# Patient Record
Sex: Male | Born: 1955
Health system: Southern US, Community
[De-identification: ages and names within clinical notes are randomized; demographics above are authoritative.]

## PROBLEM LIST (undated history)

## (undated) DIAGNOSIS — R51 Headache: Secondary | ICD-10-CM

## (undated) DIAGNOSIS — I10 Essential (primary) hypertension: Secondary | ICD-10-CM

## (undated) DIAGNOSIS — E119 Type 2 diabetes mellitus without complications: Secondary | ICD-10-CM

## (undated) DIAGNOSIS — K219 Gastro-esophageal reflux disease without esophagitis: Secondary | ICD-10-CM

## (undated) DIAGNOSIS — R519 Headache, unspecified: Secondary | ICD-10-CM

## (undated) HISTORY — DX: Type 2 diabetes mellitus without complications: E11.9

## (undated) HISTORY — PX: NO PAST SURGERIES: SHX2092

---

## 2007-01-10 ENCOUNTER — Ambulatory Visit: Payer: Self-pay | Admitting: Internal Medicine

## 2007-01-15 ENCOUNTER — Ambulatory Visit: Payer: Self-pay | Admitting: Internal Medicine

## 2007-01-15 ENCOUNTER — Ambulatory Visit (HOSPITAL_COMMUNITY): Admission: RE | Admit: 2007-01-15 | Discharge: 2007-01-15 | Payer: Self-pay | Admitting: Internal Medicine

## 2007-01-15 ENCOUNTER — Encounter (INDEPENDENT_AMBULATORY_CARE_PROVIDER_SITE_OTHER): Payer: Self-pay | Admitting: Specialist

## 2009-07-28 ENCOUNTER — Ambulatory Visit (HOSPITAL_COMMUNITY): Admission: RE | Admit: 2009-07-28 | Discharge: 2009-07-28 | Payer: Self-pay | Admitting: Pulmonary Disease

## 2011-02-18 NOTE — Op Note (Signed)
NAME:  Robert [name], Robert [name]               ACCOUNT NO.:  [id number]   MEDICAL RECORD NO.:  [id number]          PATIENT TYPE:  AMB   LOCATION:  DAY                           FACILITY:  [facility]   PHYSICIAN:  R. Garfield Cornea, M.D. DATE OF BIRTH:  [birth year]   DATE OF PROCEDURE:  01/15/2007  DATE OF DISCHARGE:                               OPERATIVE REPORT   PROCEDURE:  Colonoscopy and biopsy and hot snare polypectomy.   INDICATIONS FOR PROCEDURE:  The patient is a 55 year old gentleman with  progressive constipation and hematochezia recently.  No family history  of colorectal neoplasia.  He has never had his lower GI tract imaged.  He has derived significant improvement in his constipation symptoms just  in the past week prior to being seen by me in January 10, 2007, with  Amitiza 24 mcg b.i.d. Colonoscopy is now being done.  This approach has  been discussed with the patient at length.  Potential risks, benefits  and alternatives have been reviewed.  Questions answered, he is  agreeable.  Please see documentation in the medical record.   PROCEDURE NOTE:  O2 saturation, blood pressure, pulse and respirations  monitored throughout the entire procedure.   CONSCIOUS SEDATION:  Demerol 100 mg IV and Versed 5 mg IV in divided  doses.   INSTRUMENT:  Pentax video chip system.   FINDINGS:  Digital rectal exam revealed no abnormalities.   ENDOSCOPIC FINDINGS:  The prep was adequate.  Examination of the colonic  mucosa was undertaken from the rectosigmoid junction, through the left  transverse and right colon to the area of appendiceal orifice, ileocecal  valve and cecum.  These structures were well seen and photographed for  the record.  From this level, the scope was slowly withdrawn.  All  previously mentioned mucosal surfaces were again seen.  The patient had  a hyperplastic appearing 4 mm polyp at the splenic flexure which was  cold biopsied/removed.  In the distal descending colon, there was a 1  cm  angry pedunculated polyp with central ulceration.  Please see photos.  This was engaged with snare removed via hot snare cautery and recovered.  The remainder of the colonic mucosa appeared normal.  Scope was pulled  down in the rectum where thorough examination of the rectal mucosa  including retroflexed view of the anal verge demonstrated some friable  anal canal tissue, but there was no hemorrhoid or other lesion.  The  patient tolerated the procedure well and was reactive.   ENDOSCOPY IMPRESSION:  Friable anal canal, otherwise normal rectum.  Pedunculated polyp distal descending colon, hot snared.  Diminutive  polyp splenic flexure, cold biopsied.  Remainder of colonic mucosa  appeared normal.   RECOMMENDATIONS:  1. No aspirin or arthritis medications for 10 days.  2. Hemorrhoid, diverticulosis and polyp literature provided to Mr.      Szymborski.  3. A 10-day course of Anusol HC suppositories one per rectum at      bedtime.  4. Daily fiber supplement.  5. Resume Amitiza 24 mcg orally b.i.d.  Follow-up on path.  6. Further recommendations to  follow.      Bridgette Habermann, M.D.  Electronically Signed     RMR/MEDQ  D:  01/15/2007  T:  01/15/2007  Job:  [id number]   cc:   Percell Miller L. Luan Pulling, M.D.  Fax: [phone]

## 2011-02-18 NOTE — H&P (Signed)
NAME:  Robert Robert [name], Robert Robert [name]               ACCOUNT NO.:  [id number]   MEDICAL RECORD NO.:  [id number]          PATIENT TYPE:  AMB   LOCATION:                                FACILITY:  [facility]   PHYSICIAN:  Robert Robert [name], M.D. DATE OF BIRTH:  1956/08/20   DATE OF ADMISSION:  01/10/2007  DATE OF DISCHARGE:  LH                              HISTORY & PHYSICAL   REASON FOR CONSULTATION:  Rectal bleeding, constipation.   REFERRING PHYSICIAN:  Edward L. Luan [name], M.D.   HISTORY OF PRESENT ILLNESS:  Robert Robert [name] is a very pleasant  morbidly obese 55 year old African-American male sent over via the  courtesy of Dr. Velvet [name] to further evaluate insidiously progressive  difficulty in having a bowel movement.  He feels that he has been  chronically constipated for the past couple of years.  It is worsened  over the past couple of months.  He has had intermittent rectal bleeding  with trying to have a bowel movement, particularly over the past couple  of months.  His wife, who accompanies him today, also relates that he  may have 3 or 4 bowel movements in the morning but does not ever feel  like he has a good evacuation.  He has not really had any associated  abdominal pain.  He does have some anorectal itching.  He has not lost  any weight.  There is no family history of colorectal neoplasia.  He has  never had his lower GI tract imaged.   Just Robert past week, Robert Robert [name] was started on Amitiza 24 mcg b.i.d. by  Dr. Luan [name].  Robert has been associated with significant improvement in  bowel function, although he continues to see blood per rectum.   PAST MEDICAL HISTORY:  Hypercholesterolemia.  Although Robert Robert [name]  does not recall, I saw him back in 1999 for some fleeting right-sided  abdominal pain.  He had a fatty-appearing liver on ultrasound.  HIDA  with fatty meal challenge was normal.  LFTs were also normal back at  that time.  He does not remember having any of Robert abdominal pain and  did not come back for followup, as instructed.   PAST SURGICAL HISTORY:  None.   CURRENT MEDICATIONS:  1. Crestor 10 mg daily.  2. Amitiza 24 mcg 2 b.i.d.   FAMILY HISTORY:  Father died with congestive heart failure at age 76.  Mother is alive and in good health.  No history of chronic GI or liver  illness.   SOCIAL HISTORY:  Patient is married.  He has three children who are  adopted.  One of his adopted children is on the liver transplant list at  [facility].  She is in the process of undergoing esophageal band  ligation.  The etiology of cirrhosis not known to mom or dad.  He does  not smoke or use alcohol.  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   REVIEW OF SYSTEMS:  No recent chest pain.  No dyspnea on exertion.  No  fevers or chills.  No change in weight.   PHYSICAL EXAMINATION:  GENERAL:  A pleasant, stocky, obese 55 year old  Robert [name] accompanied by his wife.  Weight 350.  Height 6 feet 10.  Temp  98.1, BP 118/78, pulse 72.  SKIN:  Warm and dry.  CHEST:  Lungs are clear to auscultation.  CARDIAC:  Regular rate and rhythm without murmur, rub or gallop.  ABDOMEN:  Massively obese.  Positive bowel sounds.  Soft, nontender  without obvious mass or organomegaly.  EXTREMITIES:  No edema.  RECTAL:  No external lesions.  Digital exam does not reveal any obvious  mass.  Prostate is poorly palpated.  Scant brown stool in the rectal  vault is hemoccult positive.   IMPRESSION:  Robert Robert [name] is a very pleasant 55 year old morbidly  obese Robert [name] with chronic constipation, essentially worsened recently  but transiently improved Robert past week with Amitiza, who has had  hematochezia.  It is somewhat bothersome to me that he has perceived  difficulty in evacuating with hematochezia.   RECOMMENDATIONS:  Robert Robert [name] needs a colonoscopy as soon as can be  arranged.  Potential risks, benefits and alternatives have been   reviewed, questions answered.  All parties are agreeable.  We will make  further recommendations once the diagnostic colonoscopy has been  performed, hopefully next week.   I would like to thank Dr. Velvet [name] for allowing me to see Robert nice  Robert [name] today.      Robert Robert [name], M.D.  Electronically Signed     RMR/MEDQ  D:  01/10/2007  T:  01/10/2007  Job:  [id number]   cc:   Percell Miller L. Luan [name], M.D.  Fax: [phone]

## 2015-09-24 ENCOUNTER — Encounter: Payer: Self-pay | Admitting: Nutrition

## 2015-09-24 ENCOUNTER — Encounter: Payer: BC Managed Care – PPO | Attending: Pulmonary Disease | Admitting: Nutrition

## 2015-09-24 VITALS — Ht 74.0 in | Wt 381.0 lb

## 2015-09-24 DIAGNOSIS — E1165 Type 2 diabetes mellitus with hyperglycemia: Secondary | ICD-10-CM | POA: Diagnosis present

## 2015-09-24 DIAGNOSIS — Z6841 Body Mass Index (BMI) 40.0 and over, adult: Secondary | ICD-10-CM | POA: Diagnosis not present

## 2015-09-24 DIAGNOSIS — IMO0002 Reserved for concepts with insufficient information to code with codable children: Secondary | ICD-10-CM

## 2015-09-24 DIAGNOSIS — E785 Hyperlipidemia, unspecified: Secondary | ICD-10-CM | POA: Diagnosis not present

## 2015-09-24 DIAGNOSIS — Z713 Dietary counseling and surveillance: Secondary | ICD-10-CM | POA: Insufficient documentation

## 2015-09-24 DIAGNOSIS — E118 Type 2 diabetes mellitus with unspecified complications: Secondary | ICD-10-CM

## 2015-09-25 NOTE — Progress Notes (Signed)
  Medical Nutrition Therapy:  Appt start time: 1600 end time:  1530  Assessment:  Primary concerns today: Diabetes, Hyperlipidemia and Obesity. Lives with his wife. Most recent A1C  7,1%. He is here with his wife. His wife does the cooking and the shopping. He admits to liking to eat.TChol 271, TG 204, HDL 34, LDL 196. He is on Simvastatin and Metformin 1000 mg BID. Not dong any physical activity right now. Most foods are baked, broiled and fried. Eats out quite a bit. Hasn't met with a dietitian before. Willing to make lifestyle changes to improve his health and reduce risk of complications of DM, Obesity and Hyperlipidemia.  Diet is excessive in calories, fat, sodium and carbs and low in fresh fruits and vegetables.   Preferred Learning Style:   Auditory  Visual  Hands on    Learning Readiness:    Ready  Change in progress   MEDICATIONS: See lis   DIETARY INTAKE:  24-hr recall:  B ( AM): [place] biscuits, Diet soda  Snk ( AM): chips or nabs sometimes  L ( PM): fast food sometimes or sandwich Snk ( PM):  D ( PM): meat, potatoes, greens, bread, water Snk ( PM): misc Beverages: water, diet soda or crystal light  Usual physical activity: ADL  Estimated energy needs:  2000 calories  225 g carbohydrates 150 g protein 56 g fat  Progress Towards Goal(s):  In progress.   Nutritional Diagnosis:  NB-1.1 Food and nutrition-related knowledge deficit As related to DM.  As evidenced by A1C 7.1%..    Intervention: Nutrition and Diabetes education provided on My Plate, CHO counting, meal planning, portion sizes, timing of meals, avoiding snacks between meals unless having a low blood sugar, target ranges for A1C and blood sugars, signs/symptoms and treatment of hyper/hypoglycemia, monitoring blood sugars, taking medications as prescribed, benefits of exercising 30 minutes per day and prevention of complications of DM. Low Fat low sodium high fiber diet.   Goals; 1. Follow My  Plate Method 2. Cut out snacks between meals. 3. Eat 3-4 carb choices per meal. 4. Drink only water 5. Exercise 30 minutes per week. 6. Lose 1-2 lbs per week. 7. Get A1C down to 6.5% in three months.  Teaching Method Utilized:   Visual Auditory Hands on  Handouts given during visit include:  Meal Plan Card  Diabetes Instructions  Plate Method  Barriers to learning/adherence to lifestyle change:  None  Demonstrated degree of understanding via:  Teach Back   Monitoring/Evaluation:  Dietary intake, exercise, meal planning, SBG, and body weight in 1 month(s).

## 2015-09-25 NOTE — Patient Instructions (Signed)
Goals; 1. Follow My Plate Method 2. Cut out snacks between meals. 3. Eat 3-4 carb choices per meal. 4. Drink only water 5. Exercise 30 minutes per week. 6. Lose 1-2 lbs per week. 7. Get A1C down to 6.5% in three months.

## 2015-10-30 ENCOUNTER — Encounter: Payer: BC Managed Care – PPO | Attending: Pulmonary Disease | Admitting: Nutrition

## 2015-10-30 VITALS — Ht 74.0 in | Wt 377.0 lb

## 2015-10-30 DIAGNOSIS — E1165 Type 2 diabetes mellitus with hyperglycemia: Secondary | ICD-10-CM | POA: Insufficient documentation

## 2015-10-30 DIAGNOSIS — Z6841 Body Mass Index (BMI) 40.0 and over, adult: Secondary | ICD-10-CM | POA: Insufficient documentation

## 2015-10-30 DIAGNOSIS — Z713 Dietary counseling and surveillance: Secondary | ICD-10-CM | POA: Diagnosis not present

## 2015-10-30 DIAGNOSIS — E785 Hyperlipidemia, unspecified: Secondary | ICD-10-CM | POA: Diagnosis not present

## 2015-10-30 DIAGNOSIS — IMO0002 Reserved for concepts with insufficient information to code with codable children: Secondary | ICD-10-CM

## 2015-10-30 DIAGNOSIS — E118 Type 2 diabetes mellitus with unspecified complications: Secondary | ICD-10-CM

## 2015-10-30 NOTE — Progress Notes (Signed)
  Medical Nutrition Therapy:  Appt start time: 1600 end time:  1530  Assessment:  Primary concerns today: Diabetes, Hyperlipidemia and Obesity. He notes he is trying to stick with the diet. Avoiding snacks. 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 Feels better. Lost 4 lbs. Drinking more water. Making better choices slowly. Still needs more low carb vegetables and less fat and salty foods; eating bologna and biscuits. Still drinking diet soda some but more water than he use to. Still on Metformin 500 mg/d.  Preferred Learning Style:   Auditory  Visual  Hands on   Learning Readiness:    Ready  Change in progress   MEDICATIONS: See lis   DIETARY INTAKE:  24-hr recall:  B ( AM): Coffee, Cherrios  1 1/2 c with 2% milk Snk ( AM): apple L ( PM): bologna sandwich on honey wheat, with slaw,  Vegetables soup 1 cup and 6 crackers. Snk ( PM): none D ( PM): chicken baked, biscuit, and creamed potatoes, water Snk ( PM): misc  Beverages: water, diet soda or crystal light  Usual physical activity: ADL  Estimated energy needs:  2000 calories  225 g carbohydrates 150 g protein 56 g fat  Progress Towards Goal(s):  In progress.   Nutritional Diagnosis:  NB-1.1 Food and nutrition-related knowledge deficit As related to DM.  As evidenced by A1C 7.1%..    Intervention: Nutrition and Diabetes education provided on My Plate, CHO counting, meal planning, portion sizes, timing of meals, avoiding snacks between meals unless having a low blood sugar, target ranges for A1C and blood sugars, signs/symptoms and treatment of hyper/hypoglycemia, monitoring blood sugars, taking medications as prescribed, benefits of exercising 30 minutes per day and prevention of complications of DM. Low Fat low sodium high fiber diet.   Goals; 1. Follow My Plate Method 2. Cut out snacks between meals. 3. Eat 3-4  carb choices per meal. 4. Drink only water 5. Exercise 30 minutes per week. 6. Lose 1-2 lbs per week. 7. Get A1C down to 6.5% in three months. 8. Cut out processed meats of bologna, bacon, and biscuits.   Teaching Method Utilized:   Visual Auditory Hands on  Handouts given during visit include:  Meal Plan Card  Diabetes Instructions  Plate Method  Barriers to learning/adherence to lifestyle change:  None  Demonstrated degree of understanding via:  Teach Back   Monitoring/Evaluation:  Dietary intake, exercise, meal planning, SBG, and body weight in 1-3 month(s).

## 2015-11-02 ENCOUNTER — Encounter: Payer: Self-pay | Admitting: Pulmonary Disease

## 2015-11-02 NOTE — Patient Instructions (Signed)
Goals; 1. Follow My Plate Method 2. Cut out snacks between meals. 3. Eat 3-4 carb choices per meal. 4. Drink only water 5. Exercise 30 minutes per week. 6. Lose 1-2 lbs per week. 7. Get A1C down to 6.5% in three months. 8. Cut out processed meats of bologna, bacon, and biscuits.

## 2015-12-17 ENCOUNTER — Encounter: Payer: BC Managed Care – PPO | Attending: Pulmonary Disease | Admitting: Nutrition

## 2015-12-17 VITALS — Ht 74.0 in | Wt 375.0 lb

## 2015-12-17 DIAGNOSIS — E1165 Type 2 diabetes mellitus with hyperglycemia: Secondary | ICD-10-CM | POA: Diagnosis present

## 2015-12-17 DIAGNOSIS — Z713 Dietary counseling and surveillance: Secondary | ICD-10-CM | POA: Diagnosis not present

## 2015-12-17 DIAGNOSIS — Z6841 Body Mass Index (BMI) 40.0 and over, adult: Secondary | ICD-10-CM | POA: Diagnosis not present

## 2015-12-17 DIAGNOSIS — E785 Hyperlipidemia, unspecified: Secondary | ICD-10-CM | POA: Insufficient documentation

## 2015-12-17 DIAGNOSIS — E118 Type 2 diabetes mellitus with unspecified complications: Secondary | ICD-10-CM

## 2015-12-17 DIAGNOSIS — IMO0002 Reserved for concepts with insufficient information to code with codable children: Secondary | ICD-10-CM

## 2015-12-17 NOTE — Progress Notes (Signed)
  Medical Nutrition Therapy:  Appt start time: 1600 end time:  1530  Assessment:  Primary concerns today: Diabetes, Lost his daughter recently and has been depressed.  Forgot his meter. Lost 6 lbs. FBS: 150's Bedtime: 160's. Changed: Cut out sodas and eating whole wheat bread and eating more vegetables. Cut out french fries. Eating before 6 pm at night. Starting to do some walking for exercise. Diet still higher in fat and sodium, but he is getting his meal times down better and cutting ut snacks.. BS are getting better. Still on Metformin 500 mg BID.  Preferred Learning Style:   Auditory  Visual  Hands on   Learning Readiness:    Ready  Change in progress   MEDICATIONS: See lis   DIETARY INTAKE:  24-hr recall:  B ( AM): Oatmeal without sugar, Snk ( AM):  L ( PM): 1 hot dog with bun, water  Snk ( PM): none D ( PM): chicken baked, biscuit, and creamed potatoes, water Snk ( PM): misc  Beverages: water, diet soda or crystal light  Usual physical activity: ADL  Estimated energy needs:  2000 calories  225 g carbohydrates 150 g protein 56 g fat  Progress Towards Goal(s):  In progress.   Nutritional Diagnosis:  NB-1.1 Food and nutrition-related knowledge deficit As related to DM.  As evidenced by A1C 7.1%..    Intervention: Nutrition and Diabetes education provided on My Plate, CHO counting, meal planning, portion sizes, timing of meals, avoiding snacks between meals unless having a low blood sugar, target ranges for A1C and blood sugars, signs/symptoms and treatment of hyper/hypoglycemia, monitoring blood sugars, taking medications as prescribed, benefits of exercising 30 minutes per day and prevention of complications of DM. Low Fat low sodium high fiber diet.   Goals; 1. Follow My Plate Method 2. Cut out snacks between meals. 3. Eat 3-4 carb choices per meal. 4. Drink only water 5. Exercise 30 minutes per week. 6. Lose 1-2 lbs per week. 7. Get A1C down to 6.5% in  three months. 8. Cut out processed meats of bologna, bacon, and biscuits.   Teaching Method Utilized:   Visual Auditory Hands on  Handouts given during visit include:  Meal Plan Card  Diabetes Instructions  Plate Method  Barriers to learning/adherence to lifestyle change:  None  Demonstrated degree of understanding via:  Teach Back   Monitoring/Evaluation:  Dietary intake, exercise, meal planning, SBG, and body weight in 1-3 month(s).

## 2015-12-28 NOTE — Patient Instructions (Signed)
Goals; 1. Follow My Plate Method 2. Cut out snacks between meals. 3. Eat 3-4 carb choices per meal. 4. Drink only water 5. Exercise 30 minutes per week. 6. Lose 1-2 lbs per week. 7. Get A1C down to 6.5% in three months.

## 2016-02-22 ENCOUNTER — Other Ambulatory Visit (HOSPITAL_COMMUNITY): Payer: Self-pay | Admitting: Pulmonary Disease

## 2016-02-22 DIAGNOSIS — R519 Headache, unspecified: Secondary | ICD-10-CM

## 2016-02-22 DIAGNOSIS — R51 Headache: Principal | ICD-10-CM

## 2016-03-18 ENCOUNTER — Encounter: Payer: BC Managed Care – PPO | Attending: Pulmonary Disease | Admitting: Nutrition

## 2016-03-18 ENCOUNTER — Encounter: Payer: Self-pay | Admitting: Nutrition

## 2016-03-18 VITALS — Ht 73.0 in | Wt 370.0 lb

## 2016-03-18 DIAGNOSIS — E119 Type 2 diabetes mellitus without complications: Secondary | ICD-10-CM | POA: Insufficient documentation

## 2016-03-18 DIAGNOSIS — IMO0002 Reserved for concepts with insufficient information to code with codable children: Secondary | ICD-10-CM

## 2016-03-18 DIAGNOSIS — E118 Type 2 diabetes mellitus with unspecified complications: Secondary | ICD-10-CM

## 2016-03-18 DIAGNOSIS — E1165 Type 2 diabetes mellitus with hyperglycemia: Secondary | ICD-10-CM

## 2016-03-18 NOTE — Patient Instructions (Signed)
Goals. Keep up the great job!!! 1. Increase fresh fruits and vegetables and protein 2. Increase physical actvitity. 3. Lose 1-2 lbs per week. 4. Get A1C less than 6.5%.

## 2016-03-18 NOTE — Progress Notes (Signed)
  Medical Nutrition Therapy:  Appt start time: 1630 end time:  1645  Assessment:  Primary concerns today: Diabetes.   Lost 5 lbs for a total of 11 lbs so far.. Has cut out eating white bread, eating more vegetables and more baked foods. Watching portion sizes of milk. Physical aciviity; walking more and  BS are down in the150-180 mg/dl.. Cut out coffee and soft drinks.  Sleeping better. Won a weight loss contest at work.  Making progress. Still on 500 mg of Metformin BID. No current A1C.  Preferred Learning Style:   Auditory  Visual  Hands on   Learning Readiness:    Ready  Change in progress   MEDICATIONS: See lis   DIETARY INTAKE:  24-hr recall:  B ( AM): Oatmeal without sugar, Snk ( AM):  L ( PM): 1 hot dog with bun, water  Snk ( PM): none D ( PM): chicken baked, biscuit, and creamed potatoes, water Snk ( PM): misc  Beverages: water, diet soda or crystal light  Usual physical activity: ADL  Estimated energy needs:  2000 calories  225 g carbohydrates 150 g protein 56 g fat  Progress Towards Goal(s):  In progress.   Nutritional Diagnosis:  NB-1.1 Food and nutrition-related knowledge deficit As related to DM.  As evidenced by A1C 7.1%..    Intervention: Nutrition and Diabetes education provided on My Plate, CHO counting, meal planning, portion sizes, timing of meals, avoiding snacks between meals unless having a low blood sugar, target ranges for A1C and blood sugars, signs/symptoms and treatment of hyper/hypoglycemia, monitoring blood sugars, taking medications as prescribed, benefits of exercising 30 minutes per day and prevention of complications of DM. Low Fat low sodium high fiber diet.   Goals;  Goals. Keep up the great job!!! 1. Increase fresh fruits and vegetables and protein 2. Increase physical actvitity. 3. Lose 1-2 lbs per week. 4. Get A1C less than 6.5%.  Teaching Method Utilized:   Visual Auditory Hands on  Handouts given during visit  include:  Meal Plan Card  Diabetes Instructions  Plate Method  Barriers to learning/adherence to lifestyle change:  None  Demonstrated degree of understanding via:  Teach Back   Monitoring/Evaluation:  Dietary intake, exercise, meal planning, SBG, and body weight in 3 month(s).   He would benefit from going up to 1000 mg of Metformin BID for improved blood sugars.

## 2016-06-16 ENCOUNTER — Ambulatory Visit: Payer: BC Managed Care – PPO | Admitting: Nutrition

## 2016-06-28 ENCOUNTER — Ambulatory Visit: Payer: BC Managed Care – PPO | Admitting: Nutrition

## 2016-07-25 ENCOUNTER — Ambulatory Visit: Payer: BC Managed Care – PPO | Admitting: Nutrition

## 2016-08-16 ENCOUNTER — Other Ambulatory Visit (HOSPITAL_COMMUNITY): Payer: Self-pay | Admitting: Pulmonary Disease

## 2016-08-16 DIAGNOSIS — R519 Headache, unspecified: Secondary | ICD-10-CM

## 2016-08-16 DIAGNOSIS — R51 Headache: Principal | ICD-10-CM

## 2016-08-29 ENCOUNTER — Ambulatory Visit (HOSPITAL_COMMUNITY): Admission: RE | Admit: 2016-08-29 | Payer: BC Managed Care – PPO | Source: Ambulatory Visit

## 2016-09-06 ENCOUNTER — Ambulatory Visit (HOSPITAL_COMMUNITY)
Admission: RE | Admit: 2016-09-06 | Discharge: 2016-09-06 | Disposition: A | Discharge status: Home / Self Care | Payer: BC Managed Care – PPO | Source: Ambulatory Visit | Attending: Pulmonary Disease | Admitting: Pulmonary Disease

## 2016-09-06 DIAGNOSIS — R51 Headache: Secondary | ICD-10-CM | POA: Diagnosis not present

## 2016-09-06 DIAGNOSIS — R519 Headache, unspecified: Secondary | ICD-10-CM

## 2016-09-06 DIAGNOSIS — I6782 Cerebral ischemia: Secondary | ICD-10-CM | POA: Insufficient documentation

## 2016-09-15 ENCOUNTER — Other Ambulatory Visit: Payer: Self-pay | Admitting: Pulmonary Disease

## 2016-09-15 DIAGNOSIS — K146 Glossodynia: Secondary | ICD-10-CM

## 2016-09-27 ENCOUNTER — Other Ambulatory Visit: Payer: Self-pay | Admitting: Diagnostic Radiology

## 2016-09-27 ENCOUNTER — Ambulatory Visit (HOSPITAL_COMMUNITY)
Admission: RE | Admit: 2016-09-27 | Discharge: 2016-09-27 | Disposition: A | Discharge status: Home / Self Care | Payer: BC Managed Care – PPO | Source: Ambulatory Visit | Attending: Pulmonary Disease | Admitting: Pulmonary Disease

## 2016-09-27 DIAGNOSIS — M899 Disorder of bone, unspecified: Secondary | ICD-10-CM | POA: Diagnosis not present

## 2016-09-27 DIAGNOSIS — K146 Glossodynia: Secondary | ICD-10-CM

## 2016-09-27 DIAGNOSIS — R51 Headache: Secondary | ICD-10-CM | POA: Diagnosis present

## 2016-09-27 LAB — CREATININE, SERUM
Creatinine, Ser: 1.04 mg/dL (ref 0.61–1.24)
GFR calc non Af Amer: >60 mL/min (ref >60)

## 2016-09-27 MED ORDER — GADOBENATE DIMEGLUMINE 529 MG/ML IV SOLN
20.0000 mL | Freq: Once | INTRAVENOUS | Status: AC | PRN
  Administered 2016-09-27: 20 mL via INTRAVENOUS

## 2016-10-18 ENCOUNTER — Other Ambulatory Visit (HOSPITAL_COMMUNITY): Payer: Self-pay | Admitting: Pulmonary Disease

## 2016-10-18 DIAGNOSIS — R221 Localized swelling, mass and lump, neck: Secondary | ICD-10-CM

## 2016-10-28 ENCOUNTER — Ambulatory Visit (HOSPITAL_COMMUNITY)
Admission: RE | Admit: 2016-10-28 | Discharge: 2016-10-28 | Disposition: A | Discharge status: Home / Self Care | Payer: BC Managed Care – PPO | Source: Ambulatory Visit | Attending: Pulmonary Disease | Admitting: Pulmonary Disease

## 2016-10-28 DIAGNOSIS — R221 Localized swelling, mass and lump, neck: Secondary | ICD-10-CM | POA: Diagnosis present

## 2016-10-28 MED ORDER — GADOBENATE DIMEGLUMINE 529 MG/ML IV SOLN
20.0000 mL | Freq: Once | INTRAVENOUS | Status: AC | PRN
  Administered 2016-10-28: 20 mL via INTRAVENOUS

## 2016-12-06 ENCOUNTER — Telehealth: Payer: Self-pay | Admitting: Internal Medicine

## 2016-12-06 ENCOUNTER — Telehealth: Payer: Self-pay

## 2016-12-06 NOTE — Telephone Encounter (Signed)
RECALL FOR TCS °

## 2016-12-06 NOTE — Telephone Encounter (Signed)
[phone]  PATIENT RECEIVED LETTER TO SCHEDULE TCS

## 2016-12-06 NOTE — Telephone Encounter (Signed)
Letter mailed to pt to call.  

## 2016-12-07 NOTE — Telephone Encounter (Signed)
LMOM to call.

## 2016-12-07 NOTE — Telephone Encounter (Signed)
Pt's wife had left Vm that pt could talk, and she will schedule his colonoscopy. I returned her call at [phone] and got VM and [facility] for a return call.

## 2016-12-08 NOTE — Telephone Encounter (Signed)
LMOM to call.

## 2016-12-15 NOTE — Telephone Encounter (Signed)
Letter mailed to pt to call.  

## 2016-12-19 ENCOUNTER — Telehealth: Payer: Self-pay

## 2016-12-20 NOTE — Telephone Encounter (Signed)
Opened in error

## 2016-12-20 NOTE — Telephone Encounter (Signed)
Gastroenterology Pre-Procedure Review  Request Date: 12/19/2016 Requesting Physician: ON RECALL  PT'S LAST COLONOSCOPY WAS 01/15/2007.   PATIENT REVIEW QUESTIONS: The patient responded to the following health history questions as indicated:    1. Diabetes Melitis: yes 2. Joint replacements in the past 12 months: no 3. Major health problems in the past 3 months: no 4. Has an artificial valve or MVP: no 5. Has a defibrillator: no 6. Has been advised in past to take antibiotics in advance of a procedure like teeth cleaning: no 7. Family history of colon cancer: no  8. Alcohol Use: no 9. History of sleep apnea: no  10. History of coronary artery or other vascular stents placed within the last 12 months: no    MEDICATIONS & ALLERGIES:    Patient reports the following regarding taking any blood thinners:   Plavix? no Aspirin? no Coumadin? no Brilinta? no Xarelto? no Eliquis? no Pradaxa? no Savaysa? no Effient? no  Patient confirms/reports the following medications:  Current Outpatient Prescriptions  Medication Sig Dispense Refill  . indomethacin (INDOCIN SR) 75 MG CR capsule Take 75 mg by mouth 3 (three) times daily.    Marland Kitchen lisinopril (PRINIVIL,ZESTRIL) 40 MG tablet Take 40 mg by mouth daily.    . metFORMIN (GLUCOPHAGE) 500 MG tablet Take 500 mg by mouth 2 (two) times daily with a meal.    . omeprazole (PRILOSEC) 40 MG capsule Take 40 mg by mouth daily.    . tamsulosin (FLOMAX) 0.4 MG CAPS capsule Take 0.4 mg by mouth daily.    . simvastatin (ZOCOR) 20 MG tablet Take 20 mg by mouth daily.     No current facility-administered medications for this visit.     Patient confirms/reports the following allergies:  No Known Allergies  No orders of the defined types were placed in this encounter.   AUTHORIZATION INFORMATION Primary Insurance:   ID #:  Group #:  Pre-Cert / Auth required: Pre-Cert / Auth #:   Secondary Insurance:   ID #:  Group #:  Pre-Cert / Auth  required: Pre-Cert / Auth #:   SCHEDULE INFORMATION: Procedure has been scheduled as follows:  Date:              Time:   Location:   This Gastroenterology Pre-Precedure Review Form is being routed to the following provider(s): R. Garfield Cornea, MD

## 2016-12-21 NOTE — Telephone Encounter (Signed)
Ok to schedule. Take half dose Metformin the night before, none the morning of.

## 2016-12-26 NOTE — Telephone Encounter (Signed)
Letter mailed to pt to call when in a couple of weeks when I have the May schedule.

## 2017-02-06 ENCOUNTER — Other Ambulatory Visit: Payer: Self-pay

## 2017-02-06 DIAGNOSIS — Z1211 Encounter for screening for malignant neoplasm of colon: Secondary | ICD-10-CM

## 2017-02-06 MED ORDER — PEG 3350-KCL-NA BICARB-NACL 420 G PO SOLR: 4000.0000 mL | ORAL | 0 refills | Status: DC

## 2017-02-06 NOTE — Telephone Encounter (Signed)
I spoke to pt's wife and pt is scheduled for 03/17/2017 at 10:30 Am with Dr. Gala Romney. Will send Rx to pharmacy and mailing instructions to him.

## 2017-02-06 NOTE — Addendum Note (Signed)
Addended by: Everardo All on: 02/06/2017 03:27 PM   Modules accepted: Orders

## 2017-02-06 NOTE — Addendum Note (Signed)
Addended by: Everardo All on: 02/06/2017 03:22 PM   Modules accepted: Orders

## 2017-03-01 LAB — CBC AND DIFFERENTIAL
HCT: 42 (ref 41–53)
Hemoglobin: 14 (ref 13.5–17.5)
Platelets: 162 (ref 150–399)
WBC: 7.8

## 2017-03-01 LAB — CBC: RBC: 4.87 (ref 3.87–5.11)

## 2017-03-17 ENCOUNTER — Ambulatory Visit (HOSPITAL_COMMUNITY)
Admission: RE | Admit: 2017-03-17 | Discharge: 2017-03-17 | Disposition: A | Discharge status: Home / Self Care | Payer: BC Managed Care – PPO | Source: Ambulatory Visit | Attending: Internal Medicine | Admitting: Internal Medicine

## 2017-03-17 ENCOUNTER — Encounter (HOSPITAL_COMMUNITY): Payer: Self-pay | Admitting: *Deleted

## 2017-03-17 ENCOUNTER — Encounter (HOSPITAL_COMMUNITY)
Admission: RE | Disposition: A | Discharge status: Home / Self Care | Payer: Self-pay | Source: Ambulatory Visit | Attending: Internal Medicine

## 2017-03-17 DIAGNOSIS — Z1212 Encounter for screening for malignant neoplasm of rectum: Secondary | ICD-10-CM

## 2017-03-17 DIAGNOSIS — K219 Gastro-esophageal reflux disease without esophagitis: Secondary | ICD-10-CM | POA: Diagnosis not present

## 2017-03-17 DIAGNOSIS — Z1211 Encounter for screening for malignant neoplasm of colon: Secondary | ICD-10-CM | POA: Diagnosis not present

## 2017-03-17 DIAGNOSIS — Z79899 Other long term (current) drug therapy: Secondary | ICD-10-CM | POA: Insufficient documentation

## 2017-03-17 DIAGNOSIS — Z7984 Long term (current) use of oral hypoglycemic drugs: Secondary | ICD-10-CM | POA: Insufficient documentation

## 2017-03-17 DIAGNOSIS — E119 Type 2 diabetes mellitus without complications: Secondary | ICD-10-CM | POA: Diagnosis not present

## 2017-03-17 DIAGNOSIS — K5730 Diverticulosis of large intestine without perforation or abscess without bleeding: Secondary | ICD-10-CM | POA: Diagnosis not present

## 2017-03-17 DIAGNOSIS — I10 Essential (primary) hypertension: Secondary | ICD-10-CM | POA: Diagnosis not present

## 2017-03-17 HISTORY — DX: Headache, unspecified: R51.9

## 2017-03-17 HISTORY — DX: Gastro-esophageal reflux disease without esophagitis: K21.9

## 2017-03-17 HISTORY — DX: Headache: R51

## 2017-03-17 HISTORY — PX: COLONOSCOPY: SHX5424

## 2017-03-17 HISTORY — DX: Essential (primary) hypertension: I10

## 2017-03-17 LAB — GLUCOSE, CAPILLARY: Glucose-Capillary: 129 mg/dL — ABNORMAL HIGH (ref 65–99)

## 2017-03-17 LAB — HM COLONOSCOPY

## 2017-03-17 SURGERY — COLONOSCOPY
Anesthesia: Moderate Sedation

## 2017-03-17 MED ORDER — ONDANSETRON HCL 4 MG/2ML IJ SOLN
INTRAMUSCULAR | Status: AC
  Filled 2017-03-17: qty 2

## 2017-03-17 MED ORDER — SODIUM CHLORIDE 0.9 % IV SOLN
INTRAVENOUS | Status: DC
  Administered 2017-03-17: 10:00:00 via INTRAVENOUS

## 2017-03-17 MED ORDER — MIDAZOLAM HCL 5 MG/5ML IJ SOLN
INTRAMUSCULAR | Status: AC
  Filled 2017-03-17: qty 10

## 2017-03-17 MED ORDER — MEPERIDINE HCL 100 MG/ML IJ SOLN
INTRAMUSCULAR | Status: DC
Start: 2017-03-17 — End: 2017-03-17
  Filled 2017-03-17: qty 2

## 2017-03-17 MED ORDER — ONDANSETRON HCL 4 MG/2ML IJ SOLN
INTRAMUSCULAR | Status: DC | PRN
  Administered 2017-03-17: 4 mg via INTRAVENOUS

## 2017-03-17 MED ORDER — MIDAZOLAM HCL 5 MG/5ML IJ SOLN
INTRAMUSCULAR | Status: DC | PRN
  Administered 2017-03-17 (×2): 2 mg via INTRAVENOUS
  Administered 2017-03-17: 1 mg via INTRAVENOUS

## 2017-03-17 MED ORDER — MEPERIDINE HCL 100 MG/ML IJ SOLN
INTRAMUSCULAR | Status: DC | PRN
  Administered 2017-03-17: 50 mg via INTRAVENOUS
  Administered 2017-03-17: 25 mg via INTRAVENOUS
  Administered 2017-03-17: 50 mg via INTRAVENOUS

## 2017-03-17 NOTE — Discharge Instructions (Addendum)
Colonoscopy Discharge Instructions  Read the instructions outlined below and refer to this sheet in the next few weeks. These discharge instructions provide you with general information on caring for yourself after you leave the hospital. Your doctor may also give you specific instructions. While your treatment has been planned according to the most current medical practices available, unavoidable complications occasionally occur. If you have any problems or questions after discharge, call Dr. Gala Romney at [phone]. ACTIVITY  You may resume your regular activity, but move at a slower pace for the next 24 hours.   Take frequent rest periods for the next 24 hours.   Walking will help get rid of the air and reduce the bloated feeling in your belly (abdomen).   No driving for 24 hours (because of the medicine (anesthesia) used during the test).    Do not sign any important legal documents or operate any machinery for 24 hours (because of the anesthesia used during the test).  NUTRITION  Drink plenty of fluids.   You may resume your normal diet as instructed by your doctor.   Begin with a light meal and progress to your normal diet. Heavy or fried foods are harder to digest and may make you feel sick to your stomach (nauseated).   Avoid alcoholic beverages for 24 hours or as instructed.  MEDICATIONS  You may resume your normal medications unless your doctor tells you otherwise.  WHAT YOU CAN EXPECT TODAY  Some feelings of bloating in the abdomen.   Passage of more gas than usual.   Spotting of blood in your stool or on the toilet paper.  IF YOU HAD POLYPS REMOVED DURING THE COLONOSCOPY:  No aspirin products for 7 days or as instructed.   No alcohol for 7 days or as instructed.   Eat a soft diet for the next 24 hours.  FINDING OUT THE RESULTS OF YOUR TEST Not all test results are available during your visit. If your test results are not back during the visit, make an appointment  with your caregiver to find out the results. Do not assume everything is normal if you have not heard from your caregiver or the medical facility. It is important for you to follow up on all of your test results.  SEEK IMMEDIATE MEDICAL ATTENTION IF:  You have more than a spotting of blood in your stool.   Your belly is swollen (abdominal distention).   You are nauseated or vomiting.   You have a temperature over 101.   You have abdominal pain or discomfort that is severe or gets worse throughout the day.    Diverticulosis information provided  Repeat colonoscopy in 10 years.  Diverticulosis Diverticulosis is a condition that develops when small pouches (diverticula) form in the wall of the large intestine (colon). The colon is where water is absorbed and stool is formed. The pouches form when the inside layer of the colon pushes through weak spots in the outer layers of the colon. You may have a few pouches or many of them. What are the causes? The cause of this condition is not known. What increases the risk? The following factors may make you more likely to develop this condition:  Being older than age 18. Your risk for this condition increases with age. Diverticulosis is rare among people younger than age 65. By age 71, many people have it.  Eating a low-fiber diet.  Having frequent constipation.  Being overweight.  Not getting enough exercise.  Smoking.  Taking over-the-counter pain medicines, like aspirin and ibuprofen.  Having a family history of diverticulosis.  What are the signs or symptoms? In most people, there are no symptoms of this condition. If you do have symptoms, they may include:  Bloating.  Cramps in the abdomen.  Constipation or diarrhea.  Pain in the lower left side of the abdomen.  How is this diagnosed? This condition is most often diagnosed during an exam for other colon problems. Because diverticulosis usually has no symptoms, it often  cannot be diagnosed independently. This condition may be diagnosed by:  Using a flexible scope to examine the colon (colonoscopy).  Taking an X-ray of the colon after dye has been put into the colon (barium enema).  Doing a CT scan.  How is this treated? You may not need treatment for this condition if you have never developed an infection related to diverticulosis. If you have had an infection before, treatment may include:  Eating a high-fiber diet. This may include eating more fruits, vegetables, and grains.  Taking a fiber supplement.  Taking a live bacteria supplement (probiotic).  Taking medicine to relax your colon.  Taking antibiotic medicines.  Follow these instructions at home:  Drink 6-8 glasses of water or more each day to prevent constipation.  Try not to strain when you have a bowel movement.  If you have had an infection before: ? Eat more fiber as directed by your health care provider or your diet and nutrition specialist (dietitian). ? Take a fiber supplement or probiotic, if your health care provider approves.  Take over-the-counter and prescription medicines only as told by your health care provider.  If you were prescribed an antibiotic, take it as told by your health care provider. Do not stop taking the antibiotic even if you start to feel better.  Keep all follow-up visits as told by your health care provider. This is important. Contact a health care provider if:  You have pain in your abdomen.  You have bloating.  You have cramps.  You have not had a bowel movement in 3 days. Get help right away if:  Your pain gets worse.  Your bloating becomes very bad.  You have a fever or chills, and your symptoms suddenly get worse.  You vomit.  You have bowel movements that are bloody or black.  You have bleeding from your rectum. Summary  Diverticulosis is a condition that develops when small pouches (diverticula) form in the wall of the large  intestine (colon).  You may have a few pouches or many of them.  This condition is most often diagnosed during an exam for other colon problems.  If you have had an infection related to diverticulosis, treatment may include increasing the fiber in your diet, taking supplements, or taking medicines. This information is not intended to replace advice given to you by your health care provider. Make sure you discuss any questions you have with your health care provider. Document Released: 06/16/2004 Document Revised: 08/08/2016 Document Reviewed: 08/08/2016 Elsevier Interactive Patient Education  2017 [employer].

## 2017-03-17 NOTE — H&P (Signed)
@LOGO @   Primary Care Physician:  Sinda Du, MD Primary Gastroenterologist:  Dr. Gala Romney  Pre-Procedure History & Physical: HPI:  Robert [name] is a 61 y.o. male is here for a screening colonoscopy. Negative colonoscopy 10 years ago. No bowel symptoms. No family history of colon cancer.  Past Medical History:  Diagnosis Date  . Diabetes mellitus without complication ([place])   . GERD (gastroesophageal reflux disease)   . Headache   . Hypertension     Past Surgical History:  Procedure Laterality Date  . NO PAST SURGERIES      Prior to Admission medications   Medication Sig Start Date End Date Taking? Authorizing Provider  indomethacin (INDOCIN SR) 75 MG CR capsule Take 75 mg by mouth 3 (three) times daily.   Yes [provider]  lisinopril (PRINIVIL,ZESTRIL) 40 MG tablet Take 40 mg by mouth daily.   Yes [provider]  metFORMIN (GLUCOPHAGE) 500 MG tablet Take 500 mg by mouth 2 (two) times daily with a meal.   Yes [provider]  omeprazole (PRILOSEC) 40 MG capsule Take 40 mg by mouth daily.   Yes [provider]  polyethylene glycol-electrolytes (TRILYTE) 420 g solution Take 4,000 mLs by mouth as directed. 02/06/17  Yes Leeum Sankey, Cristopher Estimable, MD  simvastatin (ZOCOR) 20 MG tablet Take 20 mg by mouth daily.   Yes [provider]  tamsulosin (FLOMAX) 0.4 MG CAPS capsule Take 0.4 mg by mouth daily.   Yes [provider]  polyethylene glycol-electrolytes (TRILYTE) 420 g solution Take 4,000 mLs by mouth as directed. 02/06/17   Daneil Dolin, MD    Allergies as of 02/06/2017  . (No Known Allergies)    Family History  Problem Relation Age of Onset  . Colon cancer Neg Hx     Social History   Social History  . Marital status: Married    Spouse name: N/A  . Number of children: N/A  . Years of education: N/A   Occupational History  . Not on file.   Social History Main Topics  . Smoking status: Never Smoker  . Smokeless  tobacco: Never Used  . Alcohol use No  . Drug use: No  . Sexual activity: Not on file   Other Topics Concern  . Not on file   Social History Narrative  . No narrative on file    Review of Systems: See HPI, otherwise negative ROS  Physical Exam: BP (!) 165/92   Pulse 87   Temp 97.8 F (36.6 C) (Oral)   Resp (!) 22   Ht 6' (1.829 m)   Wt (!) 360 lb (163.3 kg)   SpO2 96%   BMI 48.82 kg/m  General:   Alert,  Well-developed, well-nourished, pleasant and cooperative in NAD Lungs:  Clear throughout to auscultation.   No wheezes, crackles, or rhonchi. No acute distress. Heart:  Regular rate and rhythm; no murmurs, clicks, rubs,  or gallops. Abdomen:  Soft, nontender and nondistended. No masses, hepatosplenomegaly or hernias noted. Normal bowel sounds, without guarding, and without rebound.    Impression/Plan: Robert [name] is now here to undergo a screening colonoscopy.  Average risk screening examination. Risks, benefits, limitations, imponderables and alternatives regarding colonoscopy have been reviewed with the patient. Questions have been answered. All parties agreeable.     Notice:  This dictation was prepared with Dragon dictation along with smaller phrase technology. Any transcriptional errors that result from this process are unintentional and may not be corrected upon review.

## 2017-03-17 NOTE — Op Note (Signed)
[facility] Patient Name: Robert [name] Procedure Date: 03/17/2017 10:11 AM MRN: [id number] Date of Birth: [birth year] Attending MD: Norvel Richards , MD CSN: [id number] Age: 61 Admit Type: Outpatient Procedure:                Colonoscopy Indications:              Screening for colorectal malignant neoplasm Providers:                Norvel Richards, MD, Lurline Del, RN, Purcell Nails.                            [place], [employer] Referring MD:              Medicines:                Midazolam 5 mg IV, Meperidine 125 mg IV,                            Ondansetron 4 mg IV Complications:            No immediate complications. Estimated Blood Loss:     Estimated blood loss was minimal. Procedure:                Pre-Anesthesia Assessment:                           - Prior to the procedure, a History and Physical                            was performed, and patient medications and                            allergies were reviewed. The patient's tolerance of                            previous anesthesia was also reviewed. The risks                            and benefits of the procedure and the sedation                            options and risks were discussed with the patient.                            All questions were answered, and informed consent                            was obtained. Prior Anticoagulants: The patient has                            taken no previous anticoagulant or antiplatelet                            agents. ASA Grade Assessment: II - A patient with  mild systemic disease. After reviewing the risks                            and benefits, the patient was deemed in                            satisfactory condition to undergo the procedure.                           After obtaining informed consent, the colonoscope                            was passed under direct vision. Throughout the                            procedure,  the patient's blood pressure, pulse, and                            oxygen saturations were monitored continuously. The                            EC-[id number] ([id number]) scope was introduced through                            the anus and advanced to the the cecum, identified                            by appendiceal orifice and ileocecal valve. The                            colonoscopy was performed without difficulty. The                            patient tolerated the procedure well. The quality                            of the bowel preparation was adequate. The                            ileocecal valve, appendiceal orifice, and rectum                            were photographed. The entire colon was well                            visualized. Scope In: 10:22:58 AM Scope Out: 10:32:00 AM Scope Withdrawal Time: 0 hours 5 minutes 52 seconds  Total Procedure Duration: 0 hours 9 minutes 2 seconds  Findings:      The perianal and digital rectal examinations were normal.      Multiple small and large-mouthed diverticula were found in the sigmoid       colon and descending colon.      The exam was otherwise without abnormality on direct and retroflexion       views. Impression:               -  Diverticulosis in the sigmoid colon and in the                            descending colon.                           - The examination was otherwise normal on direct                            and retroflexion views.                           - No specimens collected. Moderate Sedation:      Moderate (conscious) sedation was administered by the endoscopy nurse       and supervised by the endoscopist. The following parameters were       monitored: oxygen saturation, heart rate, blood pressure, respiratory       rate, EKG, adequacy of pulmonary ventilation, and response to care.       Total physician intraservice time was 15 minutes. Recommendation:           - Patient has a contact number  available for                            emergencies. The signs and symptoms of potential                            delayed complications were discussed with the                            patient. Return to normal activities tomorrow.                            Written discharge instructions were provided to the                            patient.                           - Resume previous diet.                           - Continue present medications.                           - Repeat colonoscopy in 10 years for screening                            purposes.                           - Return to my office PRN. Procedure Code(s):        --- Professional ---                           [phone], Colonoscopy, flexible; diagnostic, including  collection of specimen(s) by brushing or washing,                            when performed (separate procedure)                           99152, Moderate sedation services provided by the                            same physician or other qualified health care                            professional performing the diagnostic or                            therapeutic service that the sedation supports,                            requiring the presence of an independent trained                            observer to assist in the monitoring of the                            patient's level of consciousness and physiological                            status; initial 15 minutes of intraservice time,                            patient age 22 years or older Diagnosis Code(s):        --- Professional ---                           Z12.11, Encounter for screening for malignant                            neoplasm of colon                           K57.30, Diverticulosis of large intestine without                            perforation or abscess without bleeding CPT copyright 2016 American [employer]. All rights reserved. The  codes documented in this report are preliminary and upon coder review may  be revised to meet current compliance requirements. Cristopher Estimable. Katee Wentland, MD Norvel Richards, MD 03/17/2017 10:46:16 AM This report has been signed electronically. Number of Addenda: 0

## 2017-03-22 ENCOUNTER — Encounter (HOSPITAL_COMMUNITY): Payer: Self-pay | Admitting: Internal Medicine

## 2017-04-12 ENCOUNTER — Other Ambulatory Visit: Payer: Self-pay | Admitting: Neurosurgery

## 2017-04-12 DIAGNOSIS — D3610 Benign neoplasm of peripheral nerves and autonomic nervous system, unspecified: Secondary | ICD-10-CM

## 2017-04-29 ENCOUNTER — Ambulatory Visit
Admission: RE | Admit: 2017-04-29 | Discharge: 2017-04-29 | Disposition: A | Discharge status: Home / Self Care | Payer: BC Managed Care – PPO | Source: Ambulatory Visit | Attending: Neurosurgery | Admitting: Neurosurgery

## 2017-04-29 DIAGNOSIS — M50223 Other cervical disc displacement at C6-C7 level: Secondary | ICD-10-CM | POA: Diagnosis not present

## 2017-04-29 DIAGNOSIS — D3610 Benign neoplasm of peripheral nerves and autonomic nervous system, unspecified: Secondary | ICD-10-CM

## 2017-05-01 DIAGNOSIS — D3611 Benign neoplasm of peripheral nerves and autonomic nervous system of face, head, and neck: Secondary | ICD-10-CM | POA: Diagnosis not present

## 2017-05-01 DIAGNOSIS — I10 Essential (primary) hypertension: Secondary | ICD-10-CM | POA: Diagnosis not present

## 2017-06-06 IMAGING — MR MR NECK WO/W CM
5 of 9 series · 22 of 48 positions shown · IV contrast (agent unspecified)
Comparison: Brain MRI 09/06/2016 and 09/27/2016.

CLINICAL DATA: Left neck mass found on brain MRI.

EXAM:
MRI OF THE NECK WITH CONTRAST
TECHNIQUE: Multiplanar, multisequence MR imaging was performed following the
administration of intravenous contrast.

[Series 2: T1 · sagittal · 4.0mm · 0.47mm/px · 5 of 40 slices shown (1 of 4)]
[im 1/40]
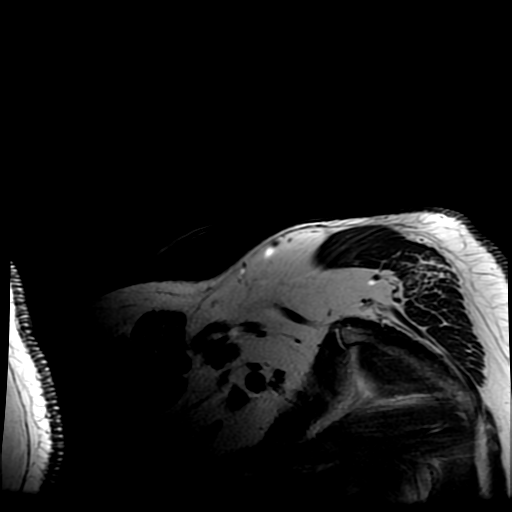
[im 10/40]
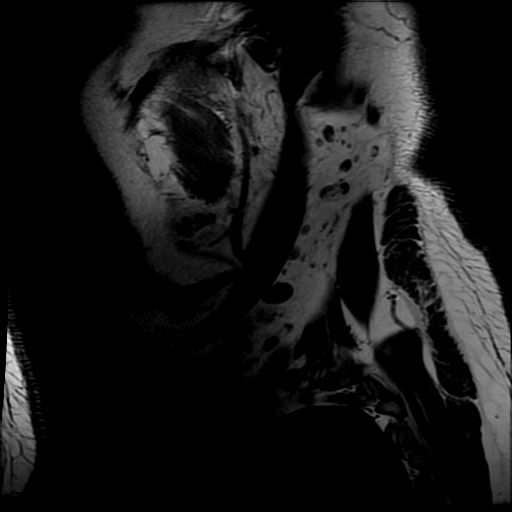
[im 20/40]
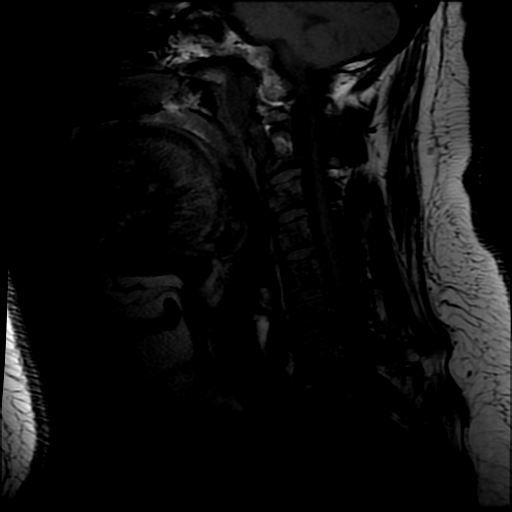
[im 30/40]
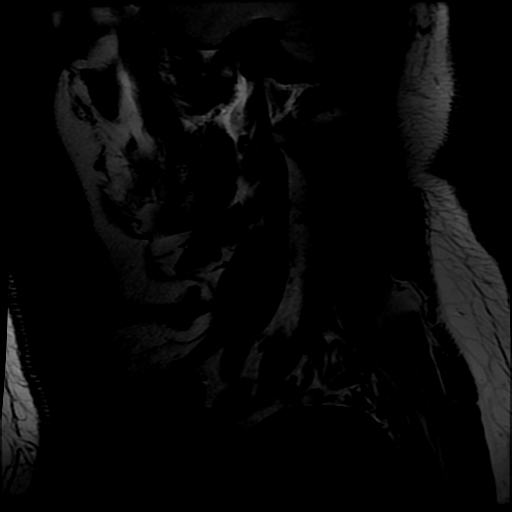
[im 40/40]
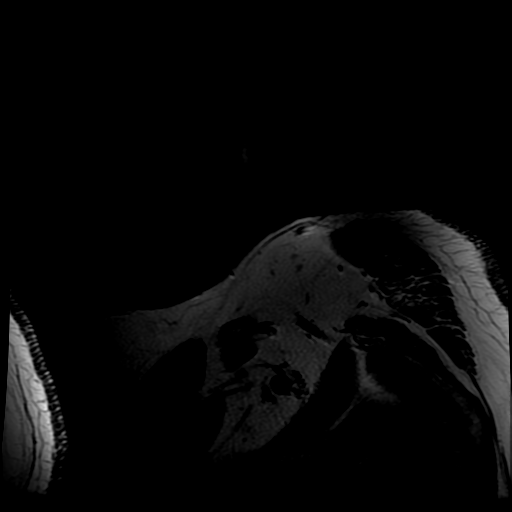

[Series 3: T1 · coronal · 4.0mm · 0.47mm/px · 5 of 37 slices shown (2 of 4)]
[im 1/37]
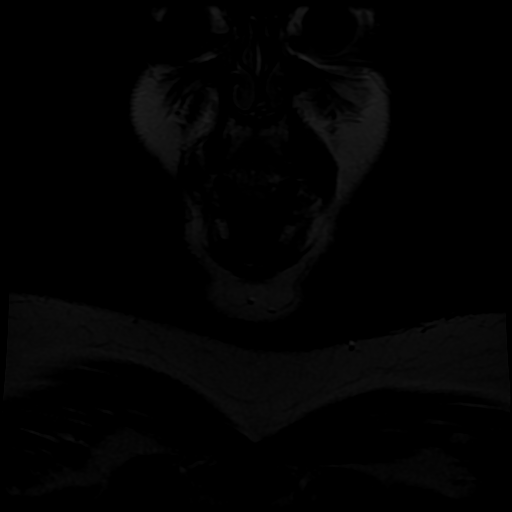
[im 10/37]
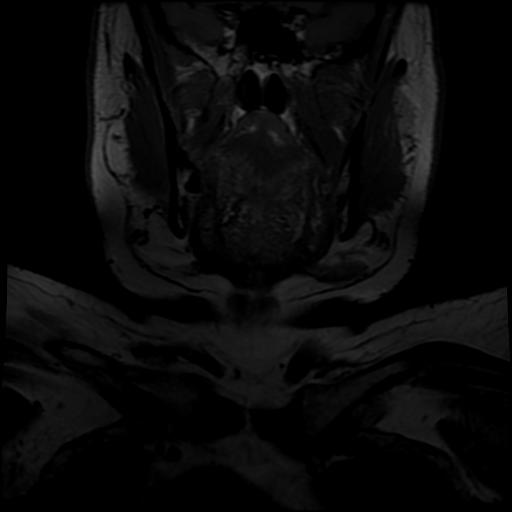
[im 19/37]
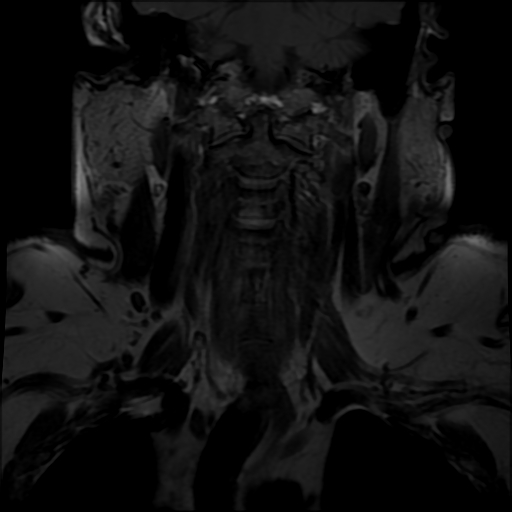
[im 28/37]
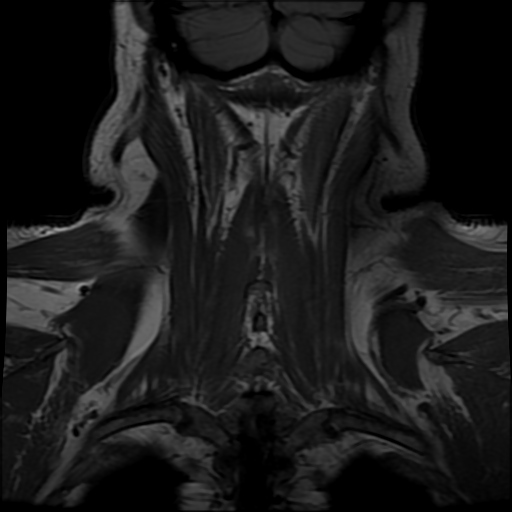
[im 37/37]
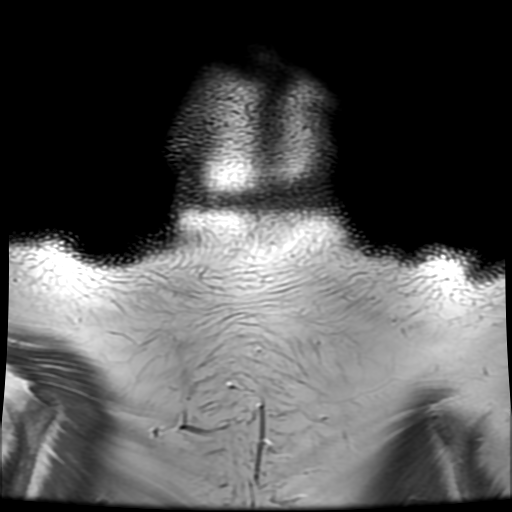

[Series 4: T1 · axial · 5.0mm · 0.43mm/px · z∈[-145,+143]mm · 6 of 49 slices shown (3 of 4)]
[im 1/49]
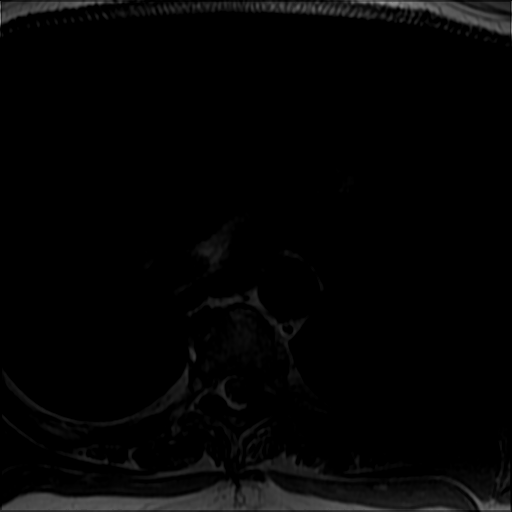
[im 10/49]
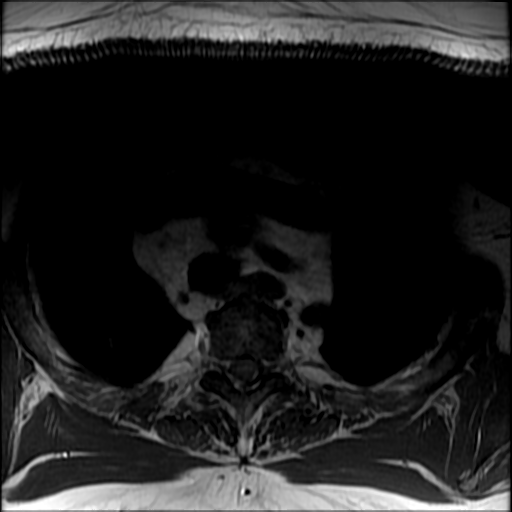
[im 20/49]
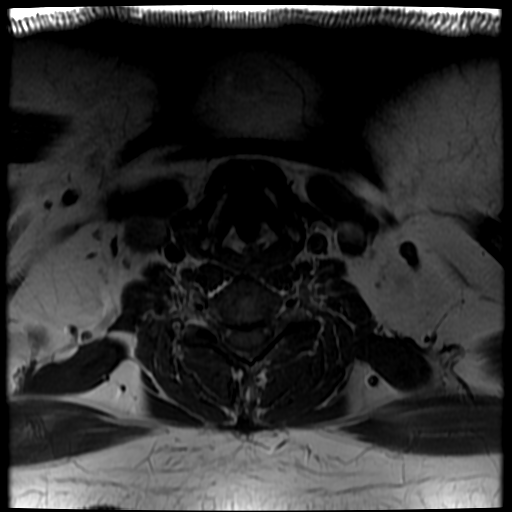
[im 29/49]
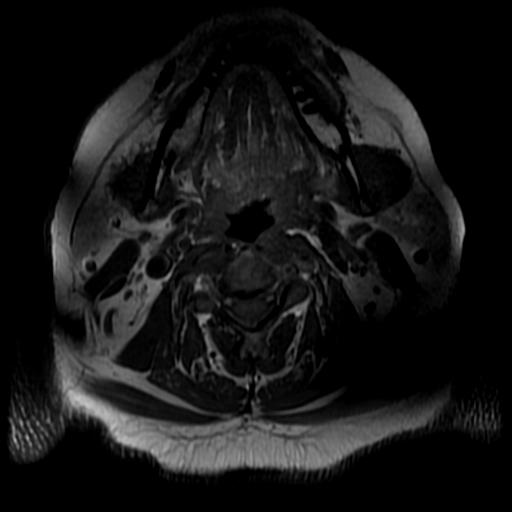
[im 39/49]
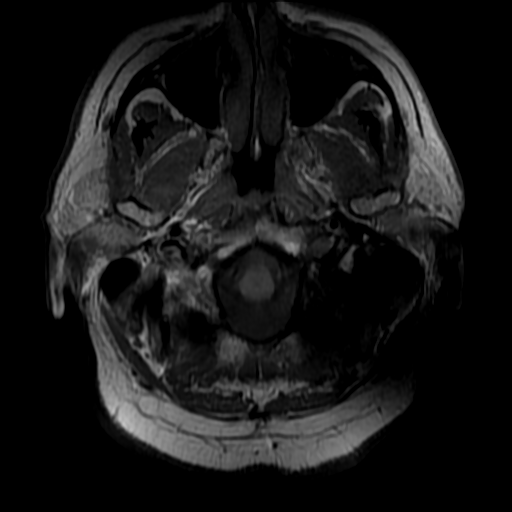
[im 49/49]
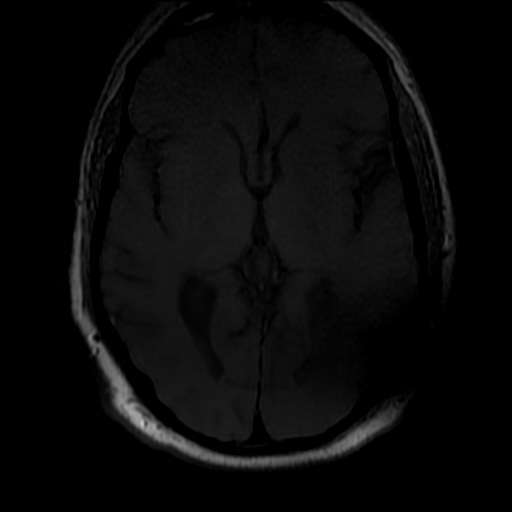

[Series 7: T2 fat-sat · sagittal · 4.0mm · 0.94mm/px · 5 of 40 slices shown]
[im 1/40]
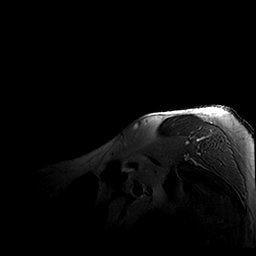
[im 10/40]
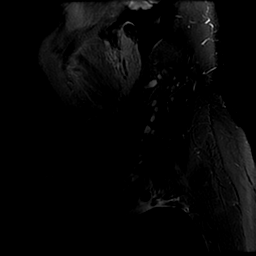
[im 20/40]
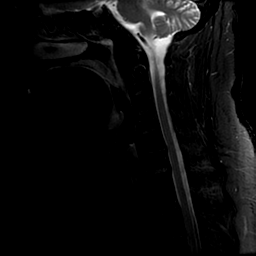
[im 30/40]
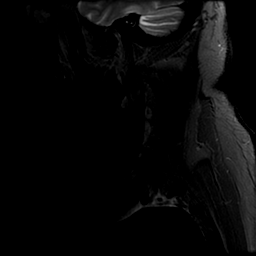
[im 40/40]
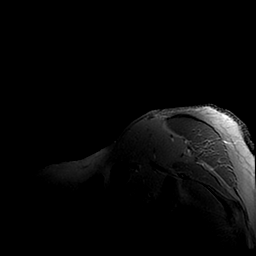

[Series 8: T1 · axial · 5.0mm · 0.43mm/px · 1 of 49 slices shown (4 of 4)]
[im 1/49]
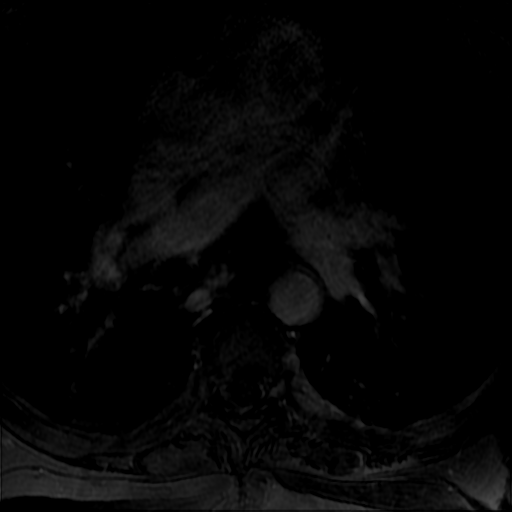

[22 of 48 positions shown; findings below may reference images not displayed]

FINDINGS: The left neck mass of concern is re- identified as a T2 hyperintense
mass extending from the left skullbase to the level of the
mandibular angle. The mass follows the carotid sheath closely.
Hypoglossal canal origin was suspected previously, but given the
course association with the jugular foramen is more likely. Notable
lack of enhancement. This enhancement pattern, T2 hyperintensity
common serpiginous appearance along the carotid sheath favors
neurofibroma with plexiform features. Lymphangioma is also
considered, although usually transspatial. Paraganglioma or
skullbase meningioma are considered unlikely given the absence
enhancement. Intravenous location not considered based on the
margins with decompressed upper IJ. Branchial cleft cysts can travel
along the carotid space rarely, but would not be this extensive and
tubular. The mass measures up to 7 cm in length by 15 mm in maximal
diameter. No atrophy seen with in the neck or in the trapezius.

Pharynx and larynx: Negative for mass or swelling. Fatty tongue
atrophy was questioned on previous brain MRI is not confirmed on
this study.

Salivary glands: No inflammation or mass.

Thyroid: Negative

Lymph nodes: No suspicious nodal enlargement or signal abnormality.

Vascular: Preserved flow voids. Left neck mass causes extrinsic
appearing deformity of the left internal jugular vein in the upper
neck.

Limited intracranial: Negative

Visualized orbits: Negative

Mastoids and visualized paranasal sinuses: Clear

Skeleton: Negative

Upper chest: Negative
IMPRESSION: The previous identified left skullbase mass continues inferiorly
along the carotid sheath and is non or hypoenhancing. Features favor
a neurofibroma. Given the cystic appearance, lymphangioma is also
considered. Further discussion above.

## 2017-09-14 ENCOUNTER — Other Ambulatory Visit: Payer: Self-pay | Admitting: Neurosurgery

## 2017-09-14 DIAGNOSIS — D3611 Benign neoplasm of peripheral nerves and autonomic nervous system of face, head, and neck: Secondary | ICD-10-CM

## 2017-09-15 DIAGNOSIS — E119 Type 2 diabetes mellitus without complications: Secondary | ICD-10-CM | POA: Diagnosis not present

## 2017-09-15 DIAGNOSIS — I10 Essential (primary) hypertension: Secondary | ICD-10-CM | POA: Diagnosis not present

## 2017-10-21 ENCOUNTER — Ambulatory Visit
Admission: RE | Admit: 2017-10-21 | Discharge: 2017-10-21 | Disposition: A | Discharge status: Home / Self Care | Payer: 59 | Source: Ambulatory Visit | Attending: Neurosurgery | Admitting: Neurosurgery

## 2017-10-21 DIAGNOSIS — M4802 Spinal stenosis, cervical region: Secondary | ICD-10-CM | POA: Diagnosis not present

## 2017-10-21 DIAGNOSIS — D3611 Benign neoplasm of peripheral nerves and autonomic nervous system of face, head, and neck: Secondary | ICD-10-CM

## 2017-11-08 ENCOUNTER — Other Ambulatory Visit (HOSPITAL_COMMUNITY): Payer: Self-pay | Admitting: Pulmonary Disease

## 2017-11-08 DIAGNOSIS — R1030 Lower abdominal pain, unspecified: Secondary | ICD-10-CM

## 2017-11-08 DIAGNOSIS — I10 Essential (primary) hypertension: Secondary | ICD-10-CM | POA: Diagnosis not present

## 2017-11-08 DIAGNOSIS — R51 Headache: Secondary | ICD-10-CM | POA: Diagnosis not present

## 2017-11-08 DIAGNOSIS — E119 Type 2 diabetes mellitus without complications: Secondary | ICD-10-CM | POA: Diagnosis not present

## 2017-11-09 ENCOUNTER — Other Ambulatory Visit (HOSPITAL_COMMUNITY)
Admission: RE | Admit: 2017-11-09 | Discharge: 2017-11-09 | Disposition: A | Discharge status: Home / Self Care | Payer: 59 | Source: Ambulatory Visit | Attending: Pulmonary Disease | Admitting: Pulmonary Disease

## 2017-11-09 DIAGNOSIS — R1030 Lower abdominal pain, unspecified: Secondary | ICD-10-CM | POA: Insufficient documentation

## 2017-11-09 LAB — CREATININE, SERUM
Creatinine, Ser: 1.11 mg/dL (ref 0.61–1.24)
GFR calc non Af Amer: >60 mL/min (ref >60)

## 2017-11-17 ENCOUNTER — Encounter (HOSPITAL_COMMUNITY): Payer: Self-pay

## 2017-11-17 ENCOUNTER — Ambulatory Visit (HOSPITAL_COMMUNITY): Payer: 59

## 2017-11-29 ENCOUNTER — Ambulatory Visit (HOSPITAL_COMMUNITY): Payer: 59

## 2017-12-11 ENCOUNTER — Ambulatory Visit (HOSPITAL_COMMUNITY)
Admission: RE | Admit: 2017-12-11 | Discharge: 2017-12-11 | Disposition: A | Discharge status: Home / Self Care | Payer: 59 | Source: Ambulatory Visit | Attending: Pulmonary Disease | Admitting: Pulmonary Disease

## 2017-12-11 DIAGNOSIS — R109 Unspecified abdominal pain: Secondary | ICD-10-CM | POA: Diagnosis not present

## 2017-12-11 DIAGNOSIS — M47816 Spondylosis without myelopathy or radiculopathy, lumbar region: Secondary | ICD-10-CM | POA: Insufficient documentation

## 2017-12-11 DIAGNOSIS — R1030 Lower abdominal pain, unspecified: Secondary | ICD-10-CM

## 2017-12-11 DIAGNOSIS — M5136 Other intervertebral disc degeneration, lumbar region: Secondary | ICD-10-CM | POA: Diagnosis not present

## 2017-12-11 MED ORDER — IOPAMIDOL (ISOVUE-300) INJECTION 61%
125.0000 mL | Freq: Once | INTRAVENOUS | Status: AC | PRN
  Administered 2017-12-11: 120 mL via INTRAVENOUS

## 2017-12-12 ENCOUNTER — Ambulatory Visit (HOSPITAL_COMMUNITY)
Admission: RE | Admit: 2017-12-12 | Discharge: 2017-12-12 | Disposition: A | Discharge status: Home / Self Care | Payer: 59 | Source: Ambulatory Visit | Attending: Pulmonary Disease | Admitting: Pulmonary Disease

## 2017-12-12 ENCOUNTER — Other Ambulatory Visit (HOSPITAL_COMMUNITY): Payer: Self-pay | Admitting: Pulmonary Disease

## 2017-12-12 DIAGNOSIS — I714 Abdominal aortic aneurysm, without rupture, unspecified: Secondary | ICD-10-CM

## 2017-12-12 DIAGNOSIS — K760 Fatty (change of) liver, not elsewhere classified: Secondary | ICD-10-CM | POA: Insufficient documentation

## 2017-12-12 DIAGNOSIS — K5790 Diverticulosis of intestine, part unspecified, without perforation or abscess without bleeding: Secondary | ICD-10-CM | POA: Insufficient documentation

## 2017-12-12 LAB — POCT I-STAT CREATININE: CREATININE: 1 mg/dL (ref 0.61–1.24)

## 2017-12-12 MED ORDER — IOPAMIDOL (ISOVUE-370) INJECTION 76%
125.0000 mL | Freq: Once | INTRAVENOUS | Status: AC | PRN
  Administered 2017-12-12: 120 mL via INTRAVENOUS

## 2017-12-25 ENCOUNTER — Ambulatory Visit: Payer: 59 | Admitting: Vascular Surgery

## 2017-12-25 ENCOUNTER — Encounter: Payer: Self-pay | Admitting: Vascular Surgery

## 2017-12-25 ENCOUNTER — Other Ambulatory Visit: Payer: Self-pay

## 2017-12-25 VITALS — BP 134/87 | HR 101 | Temp 98.5°F | Resp 18 | Ht 73.0 in | Wt 367.0 lb

## 2017-12-25 DIAGNOSIS — I776 Arteritis, unspecified: Secondary | ICD-10-CM

## 2017-12-25 NOTE — Progress Notes (Signed)
Requested by:  Sinda Du, MD [place], [place] [address]  Reason for consultation: abdominal pain    History of Present Illness   Robert [name] is a 62 y.o. ([birth year]) male who presents with cc: abdominal pain.  Patient notes onset of abdominal pain with diarrhea/constipation and some flu like sx 2 months previously.  At that time, the patient was working around people with the flu.  The patient continued to have persistence of pain.  Now he describes 3/10 vague pain in R flank that "moves around when vehicle bounces".  Patient continues to have constipation mixed with diarrhea.  Patient notes some blood tinged  stools.  He denies any further constitutional sx.  The patient denies any change in food habits or recent foreign travel.  Patient denies any hematoemesis, food fear, or post-prandial pain.  Patient had CT abd which was abnormal.  The patient denies any intermittent claudication or arm equivalent sx to intermittent claudication.   Past Medical History:  Diagnosis Date  . Diabetes mellitus without complication ([place])   . GERD (gastroesophageal reflux disease)   . Headache   . Hypertension     Past Surgical History:  Procedure Laterality Date  . COLONOSCOPY N/A 03/17/2017   Procedure: COLONOSCOPY;  Surgeon: Daneil Dolin, MD;  Location: [facility];  Service: Endoscopy;  Laterality: N/A;  10:30 AM  . NO PAST SURGERIES       Social History   Socioeconomic History  . Marital status: Married    Spouse name: Not on file  . Number of children: Not on file  . Years of education: Not on file  . Highest education level: Not on file  Occupational History  . Not on file  Social Needs  . Financial resource strain: Not on file  . Food insecurity:    Worry: Not on file    Inability: Not on file  . Transportation needs:    Medical: Not on file    Non-medical: Not on file  Tobacco Use  . Smoking status: Never Smoker  . Smokeless  tobacco: Never Used  Substance and Sexual Activity  . Alcohol use: No  . Drug use: No  . Sexual activity: Not on file  Lifestyle  . Physical activity:    Days per week: Not on file    Minutes per session: Not on file  . Stress: Not on file  Relationships  . Social connections:    Talks on phone: Not on file    Gets together: Not on file    Attends religious service: Not on file    Active member of club or organization: Not on file    Attends meetings of clubs or organizations: Not on file    Relationship status: Not on file  . Intimate partner violence:    Fear of current or ex partner: Not on file    Emotionally abused: Not on file    Physically abused: Not on file    Forced sexual activity: Not on file  Other Topics Concern  . Not on file  Social History Narrative  . Not on file    Family History  Problem Relation Age of Onset  . Colon cancer Neg Hx     Current Outpatient Medications  Medication Sig Dispense Refill  . indomethacin (INDOCIN SR) 75 MG CR capsule Take 75 mg by mouth 3 (three) times daily.    Marland Kitchen losartan (COZAAR) 100 MG tablet Take 100  mg by mouth daily.    . metFORMIN (GLUCOPHAGE) 500 MG tablet Take 500 mg by mouth 2 (two) times daily with a meal.    . omeprazole (PRILOSEC) 40 MG capsule Take 40 mg by mouth daily.    . polyethylene glycol-electrolytes (TRILYTE) 420 g solution Take 4,000 mLs by mouth as directed. 4000 mL 0  . tamsulosin (FLOMAX) 0.4 MG CAPS capsule Take 0.4 mg by mouth daily.    Marland Kitchen lisinopril (PRINIVIL,ZESTRIL) 40 MG tablet Take 40 mg by mouth daily.    . simvastatin (ZOCOR) 20 MG tablet Take 20 mg by mouth daily.     No current facility-administered medications for this visit.     Allergies  Allergen Reactions  . Other     REVIEW OF SYSTEMS (negative unless checked):   Cardiac:  _0  Chest pain or chest pressure? _1  Shortness of breath upon activity? _2  Shortness of breath when lying flat? _3  Irregular heart rhythm?  Vascular:    _4  Pain in calf, thigh, or hip brought on by walking? _5  Pain in feet at night that wakes you up from your sleep? _6  Blood clot in your veins? _7  Leg swelling?  Pulmonary:  _8  Oxygen at home? _9  Productive cough? _10  Wheezing?  Neurologic:  _11  Sudden weakness in arms or legs? _12  Sudden numbness in arms or legs? _13  Sudden onset of difficult speaking or slurred speech? _14  Temporary loss of vision in one eye? _15  Problems with dizziness?  Gastrointestinal:  _16  Blood in stool? _17  Vomited blood?  Genitourinary:  _18  Burning when urinating? _19  Blood in urine?  Psychiatric:  _20  Major depression  Hematologic:  _21  Bleeding problems? _22  Problems with blood clotting?  Dermatologic:  _23  Rashes or ulcers?  Constitutional:  _24  Fever or chills?  Ear/Nose/Throat:  _25  Change in hearing? _26  Nose bleeds? _27  Sore throat?  Musculoskeletal:  _28  Back pain? _29  Joint pain? _30  Muscle pain?   For VQI Use Only   PRE-ADM LIVING Home  AMB STATUS Ambulatory  CAD Sx None  PRIOR CHF None  STRESS TEST No    Physical Examination     Vitals:   12/25/17 0832  BP: 134/87  Pulse: (!) 101  Resp: 18  Temp: 98.5 F (36.9 C)  TempSrc: Oral  SpO2: 96%  Weight: (!) 367 lb (166.5 kg)  Height: _31  (1.854 m)   Body mass index is 48.42 kg/m.  General Alert, O x 3, Obese, NAD  Head Advance/AT,    Ear/Nose/ Throat Hearing grossly intact, nares without erythema or drainage, oropharynx without Erythema or Exudate, Mallampati score: 3,   Eyes PERRLA, EOMI,    Neck Supple, mid-line trachea,    Pulmonary Sym exp, good B air movt, CTA B  Cardiac RRR, Nl S1, S2, no Murmurs, No rubs, No S3,S4  Vascular Vessel Right Left  Radial Palpable Palpable  Brachial Palpable Palpable  Carotid Palpable, No Bruit Palpable, No Bruit  Aorta Not palpable N/A  Femoral Palpable Palpable  Popliteal Not palpable Not palpable  PT Palpable Palpable  DP Palpable Palpable    Gastro- intestinal soft,  non-distended, non-tender to palpation, No guarding or rebound, no HSM, no masses, no CVAT B, No palpable prominent aortic pulse,  no flank bruit, no flank TTP  Musculo- skeletal M/S 5/5 throughout  , Extremities without ischemic changes  , No edema present, No visible varicosities , No Lipodermatosclerosis present  Neurologic Cranial nerves 2-12 intact , Pain and light touch intact in extremities , Motor exam as  listed above  Psychiatric Judgement intact, Mood & affect appropriate for pt's clinical situation  Dermatologic See M/S exam for extremity exam, No rashes otherwise noted  Lymphatic  Palpable lymph nodes: None    Radiology     CTA abd/pelvis (12/12/17): VASCULAR  There Is again noted inflammatory changes surrounding the aorta with relative sparing of the posterior wall. No true aneurysm is identified. Differential considerations include idiopathic aortitis and retroperitoneal fibrosis. No involvement of the surrounding retroperitoneal structures is noted at this time.  No other specific vascular abnormality is noted.  NON-VASCULAR  Fatty infiltration of the liver.  Diverticulosis without diverticulitis.  Based on my review of the CTA, this patient appears to have aortitis involving the infrarenal aorta down to his iliac segments.  There are no aneurysms or evidence of intramural hematoma or frank hemorrhage.  There is no obvious etiology for these changes.   Outside Studies/Documentation   8 pages of outside documents were reviewed including: outpatient PCP.   Medical Decision Making   Robert [name] is a 62 y.o. male who presents with: chronic aortitis   There are no findings that require immediate vascular surgical intervention.  I would get ASAP evaluation of this patient with Rheumatology and ID given the extensive DDx for aortitis.  There is no lab in this office to obtain the wide spectrum labs that will need to drawn: ESR, CRP, MMP, Blood cultures,  possible viral titers...  Will also get a CTA chest to evaluate for any changes in the great vessels of the aortic arch.  Patient will follow up with me in 4 weeks.  Thank you for allowing [place] to participate in this patient's care.   Adele Barthel, MD, FACS [facility] Office: [phone] Pager: [phone]  12/25/2017, 9:48 AM

## 2017-12-27 ENCOUNTER — Other Ambulatory Visit: Payer: Self-pay

## 2017-12-27 DIAGNOSIS — I776 Arteritis, unspecified: Secondary | ICD-10-CM

## 2018-01-01 ENCOUNTER — Other Ambulatory Visit: Payer: Self-pay

## 2018-01-01 DIAGNOSIS — I776 Arteritis, unspecified: Secondary | ICD-10-CM

## 2018-01-09 ENCOUNTER — Telehealth: Payer: Self-pay | Admitting: Vascular Surgery

## 2018-01-09 NOTE — Telephone Encounter (Signed)
Resched CTA to 02/19/17 at [facility]. Resched MD to 02/28/18 at 10:30 with pt's wife, so they did not have to travel to [place] two times.

## 2018-01-09 NOTE — Telephone Encounter (Signed)
-----   Message from Conrad [place], MD sent at 01/09/2018  7:09 AM EDT ----- Regarding: RE: cta chest repeat Chest CTA never imaged the great vessels.  Medium and large vasculitis involve these vessels.  Can push out our appointment.  ----- Message ----- From: Georgiann Mccoy Sent: 01/08/2018   3:44 PM To: Conrad [place], MD Subject: RE: cta chest repeat                           The patient wants to know why we are scanning the neck.  Also his rheumatologist appt is 02/28/18, should we move the appt with you on 01/26/18 out?  ----- Message ----- From: Conrad [place], MD Sent: 12/28/2017  11:02 AM To: Tye [place] Roczniak Subject: RE: cta chest repeat                           I would change the study to CTA Neck rather than CTA Chest  ----- Message ----- From: Georgiann Mccoy Sent: 12/28/2017  10:16 AM To: Conrad [place], MD Subject: cta chest repeat                               This pt had a CTA chest on 12/12/17 at [facility]. They want to know if and why the CTA needs to be repeated.  Thank you

## 2018-01-23 ENCOUNTER — Other Ambulatory Visit (HOSPITAL_COMMUNITY): Payer: 59

## 2018-01-24 ENCOUNTER — Ambulatory Visit: Payer: 59 | Admitting: Vascular Surgery

## 2018-02-19 ENCOUNTER — Ambulatory Visit (HOSPITAL_COMMUNITY): Payer: 59

## 2018-02-28 ENCOUNTER — Ambulatory Visit: Payer: 59 | Admitting: Vascular Surgery

## 2018-03-06 ENCOUNTER — Ambulatory Visit (HOSPITAL_COMMUNITY): Payer: 59

## 2018-03-06 ENCOUNTER — Ambulatory Visit (HOSPITAL_COMMUNITY)
Admission: RE | Admit: 2018-03-06 | Discharge: 2018-03-06 | Disposition: A | Discharge status: Home / Self Care | Payer: 59 | Source: Ambulatory Visit | Attending: Vascular Surgery | Admitting: Vascular Surgery

## 2018-03-06 DIAGNOSIS — I776 Arteritis, unspecified: Secondary | ICD-10-CM

## 2018-03-06 DIAGNOSIS — I6521 Occlusion and stenosis of right carotid artery: Secondary | ICD-10-CM | POA: Diagnosis not present

## 2018-03-06 DIAGNOSIS — I6501 Occlusion and stenosis of right vertebral artery: Secondary | ICD-10-CM | POA: Diagnosis not present

## 2018-03-06 LAB — POCT I-STAT CREATININE: Creatinine, Ser: 1.9 mg/dL — ABNORMAL HIGH (ref 0.61–1.24)

## 2018-03-06 MED ORDER — IOPAMIDOL (ISOVUE-370) INJECTION 76%
60.0000 mL | Freq: Once | INTRAVENOUS | Status: AC | PRN
  Administered 2018-03-06: 60 mL via INTRAVENOUS

## 2018-03-06 MED ORDER — IOPAMIDOL (ISOVUE-370) INJECTION 76%: 100.0000 mL | Freq: Once | INTRAVENOUS | Status: DC | PRN

## 2018-03-06 MED ORDER — IOPAMIDOL (ISOVUE-370) INJECTION 76%: 60.0000 mL | Freq: Once | INTRAVENOUS | Status: DC | PRN

## 2018-03-07 ENCOUNTER — Ambulatory Visit: Payer: 59 | Admitting: Vascular Surgery

## 2018-03-08 ENCOUNTER — Telehealth: Payer: Self-pay | Admitting: Vascular Surgery

## 2018-03-08 NOTE — Telephone Encounter (Signed)
sch appt lvm 03/12/18 115pm f/u MD

## 2018-03-09 DIAGNOSIS — I776 Arteritis, unspecified: Secondary | ICD-10-CM | POA: Diagnosis not present

## 2018-03-09 DIAGNOSIS — R5383 Other fatigue: Secondary | ICD-10-CM | POA: Diagnosis not present

## 2018-03-09 DIAGNOSIS — R1084 Generalized abdominal pain: Secondary | ICD-10-CM | POA: Diagnosis not present

## 2018-03-12 ENCOUNTER — Ambulatory Visit: Payer: 59 | Admitting: Vascular Surgery

## 2018-03-13 ENCOUNTER — Ambulatory Visit (HOSPITAL_COMMUNITY): Payer: 59

## 2018-03-21 ENCOUNTER — Ambulatory Visit (HOSPITAL_COMMUNITY): Payer: 59

## 2018-04-03 DIAGNOSIS — R1084 Generalized abdominal pain: Secondary | ICD-10-CM | POA: Diagnosis not present

## 2018-04-03 DIAGNOSIS — R5383 Other fatigue: Secondary | ICD-10-CM | POA: Diagnosis not present

## 2018-04-03 DIAGNOSIS — I776 Arteritis, unspecified: Secondary | ICD-10-CM | POA: Diagnosis not present

## 2018-04-06 DIAGNOSIS — R51 Headache: Secondary | ICD-10-CM | POA: Diagnosis not present

## 2018-04-17 NOTE — Progress Notes (Deleted)
Established Aortitis   History of Present Illness   Robert [name] is a 62 y.o. ([birth year]) male who presents with chief complaint: follow up for AAA.  Previous studies demonstrate an AAA, measuring *** cm.  The patient does *** have back or abdominal pain.  The patient is *** a smoker.  The patient's PMH, PSH, SH, and FamHx were reviewed on *** are unchanged from ***.  Current Outpatient Medications  Medication Sig Dispense Refill  . indomethacin (INDOCIN SR) 75 MG CR capsule Take 75 mg by mouth 3 (three) times daily.    Marland Kitchen lisinopril (PRINIVIL,ZESTRIL) 40 MG tablet Take 40 mg by mouth daily.    Marland Kitchen losartan (COZAAR) 100 MG tablet Take 100 mg by mouth daily.    . metFORMIN (GLUCOPHAGE) 500 MG tablet Take 500 mg by mouth 2 (two) times daily with a meal.    . omeprazole (PRILOSEC) 40 MG capsule Take 40 mg by mouth daily.    . polyethylene glycol-electrolytes (TRILYTE) 420 g solution Take 4,000 mLs by mouth as directed. 4000 mL 0  . simvastatin (ZOCOR) 20 MG tablet Take 20 mg by mouth daily.    . tamsulosin (FLOMAX) 0.4 MG CAPS capsule Take 0.4 mg by mouth daily.     No current facility-administered medications for this visit.     On ROS today: ***, ***   Physical Examination  ***There were no vitals filed for this visit. ***There is no height or weight on file to calculate BMI.  General {LOC:19197::"Somulent","Alert"}, {Orientation:19197::"Confused","O x 3"}, {Weight:19197::"Obese","Cachectic","WD"}, {General state of health:19197::"Ill appearing","Elderly","NAD"}  Pulmonary {Chest wall:19197::"Asx chest movement","Sym exp"}, {Air movt:19197::"Decreased *** air movt","good B air movt"}, {BS:19197::"rales on ***","rhonchi on ***","wheezing on ***","CTA B"}  Cardiac {Rhythm:19197::"Irregularly, irregular rate and rhythm","RRR, Nl S1, S2"}, {Murmur:19197::"Murmur present: ***","no Murmurs"}, {Rubs:19197::"Rub present: ***","No rubs"}, {Gallop:19197::"Gallop present: ***","No S3,S4"}    Vascular Vessel Right Left  Radial {Palpable:19197::"Not palpable","Faintly palpable","Palpable"} {Palpable:19197::"Not palpable","Faintly palpable","Palpable"}  Brachial {Palpable:19197::"Not palpable","Faintly palpable","Palpable"} {Palpable:19197::"Not palpable","Faintly palpable","Palpable"}  Carotid Palpable, {Bruit:19197::"Bruit present","No Bruit"} Palpable, {Bruit:19197::"Bruit present","No Bruit"}  Aorta Not palpable N/A  Femoral {Palpable:19197::"Not palpable","Faintly palpable","Palpable"} {Palpable:19197::"Not palpable","Faintly palpable","Palpable"}  Popliteal {Palpable:19197::"Prominently palpable","Not palpable"} {Palpable:19197::"Prominently palpable","Not palpable"}  PT {Palpable:19197::"Not palpable","Faintly palpable","Palpable"} {Palpable:19197::"Not palpable","Faintly palpable","Palpable"}  DP {Palpable:19197::"Not palpable","Faintly palpable","Palpable"} {Palpable:19197::"Not palpable","Faintly palpable","Palpable"}    Gastro- intestinal soft, {Distension:19197::"distended","non-distended"}, {TTP:19197::"TTP in *** quadrant","appropriate tenderness to palpation","non-tender to palpation"}, {Guarding:19197::"Guarding and rebound in *** quadrant","No guarding or rebound"}, {HSM:19197::"HSM present","no HSM"}, {Masses:19197::"Mass present: ***","no masses"}, {Flank:19197::"CVAT on ***","Flank bruit present on ***","no CVAT B"}, {AAA:19197::"Palpable prominent aortic pulse","No palpable prominent aortic pulse"}, {Surgical incision:19197::"Surg incisions: ***","Surgical incisions well healed"," "}  Musculo- skeletal M/S 5/5 throughout {MS:19197::"except ***"," "}, Extremities without ischemic changes {MS:19197::"except ***"," "}, {Edema:19197::"Pitting edema present: ***","Non-pitting edema present: ***","No edema present"}, {Varicosities:19197::"Varicosities present: ***","No visible varicosities "}, {LDS:19197::"Lipodermatosclerosis present: ***","No Lipodermatosclerosis present"}   Neurologic Pain and light touch intact in extremities{CN:19197::" except for decreased sensation in ***"," "}, Motor exam as listed above    Radiology     CTA Neck (03/06/18): 1. Negative for large vessel vasculitis in the neck or upper chest. Normal visible thoracic aorta. 2. 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 No proximal vertebral artery plaque or stenosis, the left vertebral is dominant. 3. Mild cervical carotid atherosclerosis with no stenosis    Medical Decision Making   Robert [name] is a 62 y.o. ([birth year]) male who presents with: possible asx chronic aortitis, chronic inflammation of  unknown origin   Lab studies from Rheumatology are non-specific.  CTA neck is not consist with any aortic arch or great vessel involvement.  Would refer patient to Dr. Marjory Lies for Vertebral artery angiography and possible intervention as no one my practice does such.  Based on this patient's exam and diagnostic studies, he needs repeat CTA abd/pelvis in 6 month to see if any changes abdominal aortic segment.  The threshold for repair is AAA size > 5.5 cm, growth > 1 cm/yr, and symptomatic status.  I emphasized the importance of maximal medical management including strict control of blood pressure, blood glucose, and lipid levels, antiplatelet agents, obtaining regular exercise, and cessation of smoking.    Thank you for allowing [place] to participate in this patient's care.   Adele Barthel, MD, FACS [facility] Office: [phone] Pager: [phone]

## 2018-04-18 ENCOUNTER — Ambulatory Visit: Payer: 59 | Admitting: Vascular Surgery

## 2018-04-24 NOTE — Progress Notes (Signed)
Established Aortitis   History of Present Illness   JASER FULLEN is a 62 y.o. (19-Jan-1956) male who presents with chief complaint: no complaints.  Pt was sent to Rheumatology and ID work-up for aortitis.  Rheumatology work-up has demonstrated elevated inflammatory marker but no obvious diagnosis to date.  CTA neck was also done to evaluate great vessels of the arch.  The patient's PMH, PSH, SH, and FamHx were reviewed on 04/25/18 are unchanged from 12/25/17.  Current Outpatient Medications  Medication Sig Dispense Refill  . indomethacin (INDOCIN SR) 75 MG CR capsule Take 75 mg by mouth 3 (three) times daily.    Marland Kitchen lisinopril (PRINIVIL,ZESTRIL) 40 MG tablet Take 40 mg by mouth daily.    Marland Kitchen losartan (COZAAR) 100 MG tablet Take 100 mg by mouth daily.    . metFORMIN (GLUCOPHAGE) 500 MG tablet Take 500 mg by mouth 2 (two) times daily with a meal.    . omeprazole (PRILOSEC) 40 MG capsule Take 40 mg by mouth daily.    . polyethylene glycol-electrolytes (TRILYTE) 420 g solution Take 4,000 mLs by mouth as directed. 4000 mL 0  . simvastatin (ZOCOR) 20 MG tablet Take 20 mg by mouth daily.    . tamsulosin (FLOMAX) 0.4 MG CAPS capsule Take 0.4 mg by mouth daily.     No current facility-administered medications for this visit.     On ROS today: no fever or chills, no abdominal pain   Physical Examination   Vitals:   04/25/18 1537 04/25/18 1539  BP: (!) 179/109 (!) 192/112  Pulse: 99   Resp: 16   Temp: 99.3 F (37.4 C)   TempSrc: Oral   SpO2: 96%   Weight: (!) 368 lb 8 oz (167.2 kg)   Height: 6' (1.829 m)    Body mass index is 49.98 kg/m.  General Alert, O x 3, Obese, NAD  Pulmonary Sym exp, good B air movt, CTA B  Cardiac RRR, Nl S1, S2, no Murmurs, No rubs, No S3,S4  Vascular Vessel Right Left  Radial Palpable Palpable  Brachial Palpable Palpable  Carotid Palpable, No Bruit Palpable, No Bruit  Aorta Not palpable N/A  Femoral Palpable Palpable  Popliteal Not palpable Not  palpable  PT Palpable Palpable  DP Palpable Palpable    Gastro- intestinal soft, non-distended, non-tender to palpation, No guarding or rebound, no HSM, no masses, no CVAT B, No palpable prominent aortic pulse,    Musculo- skeletal M/S 5/5 throughout  , Extremities without ischemic changes  , No edema present, No visible varicosities , No Lipodermatosclerosis present  Neurologic Pain and light touch intact in extremities , Motor exam as listed above    Radiology     CTA Neck (03/06/18):  1. Negative for large vessel vasculitis in the neck or upper chest. Normal visible thoracic aorta. 2. 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  No proximal vertebral artery plaque or stenosis, the left vertebral is dominant. 3. Mild cervical carotid atherosclerosis with no stenosis.  I reviewed the CTA, there is no inflammation involving the aortic arch.  I agree that there appears to be a L VA stenosis going to basilar artery.   Medical Decision Making   CARLESTER KASPAREK is a 62 y.o. (12-11-1955) male who presents with: radiologic evidence of aortitis, R VA occlusion, R VA/basilic high grade stenosis   Pt is essentially asx from his  aortitis at this point, so hard to justify an aggressive stance.  Would repeat CTA abd/pelvis in 6 months.  In regards to L VA stenosis, would refer patient to Dr. Estanislado Pandy for VA angiogram and possible intervention.  Thank you for allowing [place] to participate in this patient's care.   Adele Barthel, MD, FACS [facility] Office: [phone] Pager: [phone]

## 2018-04-25 ENCOUNTER — Encounter: Payer: Self-pay | Admitting: Vascular Surgery

## 2018-04-25 ENCOUNTER — Ambulatory Visit (INDEPENDENT_AMBULATORY_CARE_PROVIDER_SITE_OTHER): Payer: 59 | Admitting: Vascular Surgery

## 2018-04-25 ENCOUNTER — Other Ambulatory Visit: Payer: Self-pay

## 2018-04-25 VITALS — BP 192/112 | HR 99 | Temp 99.3°F | Resp 16 | Ht 72.0 in | Wt 368.5 lb

## 2018-04-25 DIAGNOSIS — I776 Arteritis, unspecified: Secondary | ICD-10-CM

## 2018-05-02 ENCOUNTER — Other Ambulatory Visit: Payer: Self-pay

## 2018-05-02 DIAGNOSIS — I776 Arteritis, unspecified: Secondary | ICD-10-CM

## 2018-06-11 ENCOUNTER — Ambulatory Visit (INDEPENDENT_AMBULATORY_CARE_PROVIDER_SITE_OTHER): Payer: 59 | Admitting: Otolaryngology

## 2018-06-11 DIAGNOSIS — H6983 Other specified disorders of Eustachian tube, bilateral: Secondary | ICD-10-CM | POA: Diagnosis not present

## 2018-06-11 DIAGNOSIS — J310 Chronic rhinitis: Secondary | ICD-10-CM

## 2018-06-11 DIAGNOSIS — H903 Sensorineural hearing loss, bilateral: Secondary | ICD-10-CM | POA: Diagnosis not present

## 2018-06-27 ENCOUNTER — Ambulatory Visit
Admission: RE | Admit: 2018-06-27 | Discharge: 2018-06-27 | Disposition: A | Discharge status: Home / Self Care | Payer: 59 | Source: Ambulatory Visit | Attending: Vascular Surgery | Admitting: Vascular Surgery

## 2018-06-27 ENCOUNTER — Ambulatory Visit (INDEPENDENT_AMBULATORY_CARE_PROVIDER_SITE_OTHER): Payer: 59 | Admitting: Vascular Surgery

## 2018-06-27 ENCOUNTER — Encounter: Payer: Self-pay | Admitting: Radiology

## 2018-06-27 ENCOUNTER — Other Ambulatory Visit: Payer: Self-pay

## 2018-06-27 ENCOUNTER — Ambulatory Visit: Payer: 59 | Admitting: Vascular Surgery

## 2018-06-27 VITALS — BP 190/97 | HR 87 | Temp 97.6°F | Resp 20 | Ht 72.0 in | Wt 368.0 lb

## 2018-06-27 DIAGNOSIS — I714 Abdominal aortic aneurysm, without rupture: Secondary | ICD-10-CM | POA: Diagnosis not present

## 2018-06-27 DIAGNOSIS — I776 Arteritis, unspecified: Secondary | ICD-10-CM

## 2018-06-27 MED ORDER — IOPAMIDOL (ISOVUE-370) INJECTION 76%
75.0000 mL | Freq: Once | INTRAVENOUS | Status: AC | PRN
  Administered 2018-06-27: 75 mL via INTRAVENOUS

## 2018-06-27 NOTE — Progress Notes (Signed)
Patient name: Robert [name] MRN: [id number] DOB: May 26, 1956 Sex: male  REASON FOR VISIT:   Follow-up of aortitis.  This patient was previously followed by Dr. Adele Barthel.  HPI:   Robert [name] is a pleasant 62 y.o. male who was seen in consultation by Dr. Adele Barthel on 12/25/2017.  She was referred with abdominal pain.  The symptoms had began in January of this year.  The patient described vague abdominal pain that was fairly persistent.  She denies any food fear or postprandial abdominal pain.  CTA at that time showed inflammatory changes surrounding the aorta but no true aneurysm was identified.  The differential diagnosis was felt to include a idiopathic aortitis and retroperitoneal fibrosis.  Consultation with rheumatology and infectious disease was recommended given the extensive differential diagnosis associated with aortitis.    The patient was seen by rheumatology and work-up did show evidence of an elevated inflammatory markers.  CTA of the neck was done to evaluate the arch and great vessels.  The patient was seen again by Dr. Adele Barthel on 04/25/2018.  CTA of the neck was reviewed and this showed no large vessel vasculitis in the neck or upper chest.  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  There was a high-grade string sign of the left vertebral artery.  The patient was referred to Dr. Estanislado Pandy for about possible angiography to further evaluate the vertebral artery disease.  At that visit Dr. Adele Barthel recommended a follow-up CTA of the abdomen and the patient presents today to discuss these results.  Since he was seen last he denies any abdominal pain or back pain.  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  I wonder if there was a posture issue in the old truck that may have been contributing to his abdominal pain.  He denies any fever, chills, night sweats, or other constitutional  symptoms.  Past Medical History:  Diagnosis Date  . Diabetes mellitus without complication ([place])   . GERD (gastroesophageal reflux disease)   . Headache   . Hypertension     Family History  Problem Relation Age of Onset  . Colon cancer Neg Hx     SOCIAL HISTORY: Social History   Tobacco Use  . Smoking status: Never Smoker  . Smokeless tobacco: Never Used  Substance Use Topics  . Alcohol use: No    No Known Allergies  Current Outpatient Medications  Medication Sig Dispense Refill  . indomethacin (INDOCIN SR) 75 MG CR capsule Take 75 mg by mouth 3 (three) times daily.    Marland Kitchen lisinopril (PRINIVIL,ZESTRIL) 40 MG tablet Take 40 mg by mouth daily.    Marland Kitchen losartan (COZAAR) 100 MG tablet Take 100 mg by mouth daily.    Marland Kitchen omeprazole (PRILOSEC) 40 MG capsule Take 40 mg by mouth daily.    . sitaGLIPtin (JANUVIA) 100 MG tablet Take 100 mg by mouth daily.    . tamsulosin (FLOMAX) 0.4 MG CAPS capsule Take 0.4 mg by mouth daily.     No current facility-administered medications for this visit.     REVIEW OF SYSTEMS:  [X]  denotes positive finding, [ ]  denotes negative finding Cardiac  Comments:  Chest pain or chest pressure:    Shortness of breath upon exertion:    Short of breath when lying flat:    Irregular heart rhythm:        Vascular  Pain in calf, thigh, or hip brought on by ambulation:    Pain in feet at night that wakes you up from your sleep:     Blood clot in your veins:    Leg swelling:         Pulmonary    Oxygen at home:    Productive cough:     Wheezing:         Neurologic    Sudden weakness in arms or legs:     Sudden numbness in arms or legs:     Sudden onset of difficulty speaking or slurred speech:    Temporary loss of vision in one eye:     Problems with dizziness:         Gastrointestinal    Blood in stool:     Vomited blood:         Genitourinary    Burning when urinating:     Blood in urine:        Psychiatric    Major depression:          Hematologic    Bleeding problems:    Problems with blood clotting too easily:        Skin    Rashes or ulcers:        Constitutional    Fever or chills:     PHYSICAL EXAM:   Vitals:   06/27/18 1030 06/27/18 1033  BP: (!) 184/93 (!) 190/97  Pulse: 87   Resp: 20   Temp: 97.6 F (36.4 C)   TempSrc: Oral   SpO2: 99%   Weight: (!) 368 lb (166.9 kg)   Height: 6' (1.829 m)     GENERAL: The patient is a well-nourished male, in no acute distress. The vital signs are documented above. CARDIAC: There is a regular rate and rhythm.  VASCULAR: I do not detect carotid bruits. He has palpable pedal pulses bilaterally. PULMONARY: There is good air exchange bilaterally without wheezing or rales. ABDOMEN: Soft and non-tender with normal pitched bowel sounds.  MUSCULOSKELETAL: There are no major deformities or cyanosis. NEUROLOGIC: No focal weakness or paresthesias are detected. SKIN: There are no ulcers or rashes noted. PSYCHIATRIC: The patient has a normal affect.  DATA:    CT ANGIOGRAM ABDOMEN AND PELVIS: I have reviewed the CT angiogram of the abdomen and pelvis that was done today.  This has been read by radiology and it is noted that the wall thickening and inflammatory changes of the lower abdominal aorta have improved.  There is no vessel narrowing or dissection.  There is some increased adenopathy in the aorta and right iliac chain.  Radiology noted that the findings could represent left-sided pyelonephritis with inflammatory adenopathy and inflammatory changes of the aorta.  The differential diagnosis also included retroperitoneal fibrosis or aortic vasculitis.  I have reviewed the images myself and I would agree that the inflammatory changes of the lower abdominal aorta and iliac arteries has improved significantly.  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  Given that his inflammatory changes have significantly improved I think we can stretch his follow-up out to 1 year and I have ordered a follow-up CT angiogram of the abdomen and pelvis in 1 year.  If this is resolved and perhaps he will not need continued follow-up.  However, I think at least another follow-up visit a year is indicated given  the long differential diagnosis which is being considered.  Currently however he is asymptomatic and his changes have improved significantly.  He will call sooner if you have problems.  HYPERTENSION: The patient's initial blood pressure today was elevated. We repeated this and this was still elevated. We have encouraged the patient to follow up with their primary care physician for management of their blood pressure.   Deitra Mayo [facility] ([facility]) [phone]

## 2018-07-04 DIAGNOSIS — I10 Essential (primary) hypertension: Secondary | ICD-10-CM | POA: Diagnosis not present

## 2018-07-04 DIAGNOSIS — J209 Acute bronchitis, unspecified: Secondary | ICD-10-CM | POA: Diagnosis not present

## 2018-07-04 DIAGNOSIS — E119 Type 2 diabetes mellitus without complications: Secondary | ICD-10-CM | POA: Diagnosis not present

## 2018-07-20 DIAGNOSIS — R51 Headache: Secondary | ICD-10-CM | POA: Diagnosis not present

## 2018-07-23 ENCOUNTER — Ambulatory Visit (INDEPENDENT_AMBULATORY_CARE_PROVIDER_SITE_OTHER): Payer: 59 | Admitting: Otolaryngology

## 2018-11-22 DIAGNOSIS — R51 Headache: Secondary | ICD-10-CM | POA: Diagnosis not present

## 2018-11-29 DIAGNOSIS — E1140 Type 2 diabetes mellitus with diabetic neuropathy, unspecified: Secondary | ICD-10-CM | POA: Diagnosis not present

## 2018-11-29 DIAGNOSIS — I10 Essential (primary) hypertension: Secondary | ICD-10-CM | POA: Diagnosis not present

## 2018-12-04 DIAGNOSIS — E1140 Type 2 diabetes mellitus with diabetic neuropathy, unspecified: Secondary | ICD-10-CM | POA: Diagnosis not present

## 2018-12-04 DIAGNOSIS — I10 Essential (primary) hypertension: Secondary | ICD-10-CM | POA: Diagnosis not present

## 2018-12-04 LAB — COMPREHENSIVE METABOLIC PANEL
Albumin: 3.8 (ref 3.5–5.0)
Calcium: 9.2 (ref 8.7–10.7)
GFR calc Af Amer: 47
GFR calc non Af Amer: 40
Globulin: 2.8

## 2018-12-04 LAB — HEPATIC FUNCTION PANEL
ALT: 7 — AB (ref 10–40)
AST: 8 — AB (ref 14–40)
Alkaline Phosphatase: 77 (ref 25–125)

## 2018-12-04 LAB — BASIC METABOLIC PANEL
BUN: 16 (ref 4–21)
CO2: 26 — AB (ref 13–22)
Chloride: 104 (ref 99–108)
Creatinine: 1.8 — AB (ref 0.6–1.3)
Glucose: 132
Potassium: 4.6 (ref 3.4–5.3)
Sodium: 138 (ref 137–147)

## 2018-12-04 LAB — LIPID PANEL
Cholesterol: 288 — AB (ref 0–200)
HDL: 36 (ref 35–70)
LDL Cholesterol: 210
Triglycerides: 227 — AB (ref 40–160)

## 2018-12-04 LAB — HEMOGLOBIN A1C: Hemoglobin A1C: 6.9

## 2019-01-21 DIAGNOSIS — J45901 Unspecified asthma with (acute) exacerbation: Secondary | ICD-10-CM | POA: Diagnosis not present

## 2019-01-21 DIAGNOSIS — E1140 Type 2 diabetes mellitus with diabetic neuropathy, unspecified: Secondary | ICD-10-CM | POA: Diagnosis not present

## 2019-02-18 DIAGNOSIS — R51 Headache: Secondary | ICD-10-CM | POA: Diagnosis not present

## 2019-02-28 DIAGNOSIS — I10 Essential (primary) hypertension: Secondary | ICD-10-CM | POA: Diagnosis not present

## 2019-02-28 DIAGNOSIS — J45901 Unspecified asthma with (acute) exacerbation: Secondary | ICD-10-CM | POA: Diagnosis not present

## 2019-02-28 DIAGNOSIS — E119 Type 2 diabetes mellitus without complications: Secondary | ICD-10-CM | POA: Diagnosis not present

## 2019-06-26 ENCOUNTER — Ambulatory Visit: Payer: Self-pay | Admitting: "Endocrinology

## 2019-06-26 ENCOUNTER — Encounter

## 2019-07-10 ENCOUNTER — Ambulatory Visit: Payer: Self-pay | Admitting: "Endocrinology

## 2019-07-16 ENCOUNTER — Ambulatory Visit: Payer: 59 | Admitting: "Endocrinology

## 2019-07-16 ENCOUNTER — Encounter: Payer: 59 | Attending: "Endocrinology | Admitting: Nutrition

## 2019-07-16 ENCOUNTER — Encounter: Payer: Self-pay | Admitting: "Endocrinology

## 2019-07-16 ENCOUNTER — Other Ambulatory Visit: Payer: Self-pay

## 2019-07-16 VITALS — BP 161/96 | HR 98 | Ht 73.0 in | Wt 372.0 lb

## 2019-07-16 DIAGNOSIS — E1165 Type 2 diabetes mellitus with hyperglycemia: Secondary | ICD-10-CM | POA: Insufficient documentation

## 2019-07-16 DIAGNOSIS — I10 Essential (primary) hypertension: Secondary | ICD-10-CM | POA: Insufficient documentation

## 2019-07-16 DIAGNOSIS — E782 Mixed hyperlipidemia: Secondary | ICD-10-CM

## 2019-07-16 LAB — POCT GLYCOSYLATED HEMOGLOBIN (HGB A1C): Hemoglobin A1C: 8.6 % — AB (ref 4.0–5.6)

## 2019-07-16 MED ORDER — HYDROCHLOROTHIAZIDE 25 MG PO TABS: 25.0000 mg | ORAL_TABLET | Freq: Every day | ORAL | 2 refills | Status: AC

## 2019-07-16 MED ORDER — ROSUVASTATIN CALCIUM 40 MG PO TABS: 40.0000 mg | ORAL_TABLET | Freq: Every day | ORAL | 3 refills | Status: AC

## 2019-07-16 MED ORDER — SITAGLIPTIN PHOSPHATE 50 MG PO TABS: 50.0000 mg | ORAL_TABLET | Freq: Every day | ORAL | 3 refills | Status: DC

## 2019-07-16 MED ORDER — ROSUVASTATIN CALCIUM 40 MG PO TABS: 40.0000 mg | ORAL_TABLET | Freq: Every day | ORAL | 3 refills | Status: DC

## 2019-07-16 NOTE — Progress Notes (Signed)
  Medical Nutrition Therapy:  Appt start time: 1030 end time:  1100.   Assessment:  Primary concerns today: Diabetes Type 2, Obesity. Walk in from Dr. Dorris Fetch, Endocrinology. I have worked with him in the past. He wants to get back on track, lose weight and improve his DM.    Preferred Learning Style:     No preference indicated    Learning Readiness:    Ready  Change in progress   MEDICATIONS:   DIETARY INTAKE:  Usual physical activity: Walks   Estimated energy needs: 1800 calories 200 g carbohydrates 135 g protein 50 g fat  Progress Towards Goal(s):  In progress.   Nutritional Diagnosis:  NB-1.1 Food and nutrition-related knowledge deficit As related to Diabetes .  As evidenced by A1C 8.6%.    Intervention:  Nutrition and Diabetes education provided on My Plate, CHO counting, meal planning, portion sizes, timing of meals, avoiding snacks between meals unless having a low blood sugar, target ranges for A1C and blood sugars, signs/symptoms and treatment of hyper/hypoglycemia, monitoring blood sugars, taking medications as prescribed, benefits of exercising 30 minutes per day and prevention of complications of DM.  Goals Follow MY Plate Eat 3-4 carb choices per meal (45-60 grams) Eat meals on time. Cut out snacks Drink only water daily. Walk 30-60 minutes a day Lose 1-2 lbs per week Get A1C down to 7% Lose 10 lbs in the next month.   Teaching Method Utilized:  Visual Auditory Hands on  Handouts given during visit include:  The Plate Method    Meal Plan Card    Barriers to learning/adherence to lifestyle change: none  Demonstrated degree of understanding via:  Teach Back   Monitoring/Evaluation:  Dietary intake, exercise, , and body weight in 1 month(s).

## 2019-07-16 NOTE — Patient Instructions (Signed)
Goals Follow MY Plate Eat 3-4 carb choices per meal (45-60 grams) Eat meals on time. Cut out snacks Drink only water daily. Walk 30-60 minutes a day Lose 1-2 lbs per week Get A1C down to 7% Lose 10 lbs in the next month.

## 2019-07-16 NOTE — Patient Instructions (Signed)

## 2019-07-16 NOTE — Progress Notes (Signed)
Endocrinology Consult Note       07/16/2019, 10:06 AM   Subjective:    Patient ID: Robert [name], male    DOB: Feb 25, 1956.  Robert [name] is being seen in consultation for management of currently uncontrolled symptomatic diabetes requested by  Sinda Du, MD.   Past Medical History:  Diagnosis Date  . Diabetes mellitus without complication ([place])   . GERD (gastroesophageal reflux disease)   . Headache   . Hypertension     Past Surgical History:  Procedure Laterality Date  . COLONOSCOPY N/A 03/17/2017   Procedure: COLONOSCOPY;  Surgeon: Daneil Dolin, MD;  Location: [facility];  Service: Endoscopy;  Laterality: N/A;  10:30 AM  . NO PAST SURGERIES      Social History   Socioeconomic History  . Marital status: Married    Spouse name: Not on file  . Number of children: Not on file  . Years of education: Not on file  . Highest education level: Not on file  Occupational History  . Not on file  Social Needs  . Financial resource strain: Not on file  . Food insecurity    Worry: Not on file    Inability: Not on file  . Transportation needs    Medical: Not on file    Non-medical: Not on file  Tobacco Use  . Smoking status: Never Smoker  . Smokeless tobacco: Never Used  Substance and Sexual Activity  . Alcohol use: No  . Drug use: No  . Sexual activity: Not on file  Lifestyle  . Physical activity    Days per week: Not on file    Minutes per session: Not on file  . Stress: Not on file  Relationships  . Social Herbalist on phone: Not on file    Gets together: Not on file    Attends religious service: Not on file    Active member of club or organization: Not on file    Attends meetings of clubs or organizations: Not on file    Relationship status: Not on file  Other Topics Concern  . Not on file  Social History Narrative  . Not on file    Family History   Problem Relation Age of Onset  . Colon cancer Neg Hx     Outpatient Encounter Medications as of 07/16/2019  Medication Sig  . glipiZIDE (GLUCOTROL) 5 MG tablet Take by mouth 2 (two) times daily before a meal.  . indomethacin (INDOCIN SR) 75 MG CR capsule Take 75 mg by mouth 3 (three) times daily.  Marland Kitchen losartan (COZAAR) 100 MG tablet Take 100 mg by mouth daily.  Marland Kitchen omeprazole (PRILOSEC) 40 MG capsule Take 40 mg by mouth daily.  . rosuvastatin (CRESTOR) 40 MG tablet Take 1 tablet (40 mg total) by mouth at bedtime.  . tamsulosin (FLOMAX) 0.4 MG CAPS capsule Take by mouth daily.  . [DISCONTINUED] rosuvastatin (CRESTOR) 10 MG tablet Take 10 mg by mouth daily.  . hydrochlorothiazide (HYDRODIURIL) 25 MG tablet Take 1 tablet (25 mg total) by mouth daily.  . sitaGLIPtin (JANUVIA) 50 MG tablet Take 1  tablet (50 mg total) by mouth daily.  . [DISCONTINUED] lisinopril (PRINIVIL,ZESTRIL) 40 MG tablet Take 40 mg by mouth daily.  . [DISCONTINUED] sitaGLIPtin (JANUVIA) 100 MG tablet Take 100 mg by mouth daily.  . [DISCONTINUED] tamsulosin (FLOMAX) 0.4 MG CAPS capsule Take 0.4 mg by mouth daily.   No facility-administered encounter medications on file as of 07/16/2019.     ALLERGIES: Allergies  Allergen Reactions  . Penicillins     VACCINATION STATUS:  There is no immunization history on file for this patient.  Diabetes He presents for his initial diabetic visit. He has type 2 diabetes mellitus. Onset time: He was diagnosed at approximate age of 7 years. His disease course has been worsening. There are no hypoglycemic associated symptoms. Pertinent negatives for hypoglycemia include no confusion, headaches, pallor or seizures. Associated symptoms include polydipsia and polyuria. Pertinent negatives for diabetes include no chest pain, no fatigue, no polyphagia and no weakness. There are no hypoglycemic complications. Symptoms are worsening. Diabetic complications include nephropathy. Risk factors for  coronary artery disease include diabetes mellitus, dyslipidemia, family history, male sex, obesity, hypertension and sedentary lifestyle. Current diabetic treatment includes oral agent (monotherapy). His weight is increasing steadily. He is following a generally unhealthy diet. When asked about meal planning, he reported none. He has not had a previous visit with a dietitian. He never participates in exercise. (He does not monitor blood glucose regularly.  His point-of-care A1c today was 8.6%, worsening from his prior A1c of 6.9%.) An ACE inhibitor/angiotensin II receptor blocker is being taken. Eye exam is current.  Hyperlipidemia This is a chronic problem. The current episode started more than 1 year ago. The problem is uncontrolled. Exacerbating diseases include chronic renal disease, diabetes and obesity. Pertinent negatives include no chest pain, myalgias or shortness of breath. Current antihyperlipidemic treatment includes statins. Risk factors for coronary artery disease include diabetes mellitus, dyslipidemia, family history, obesity, male sex, hypertension and a sedentary lifestyle.  Hypertension This is a chronic problem. The current episode started more than 1 year ago. Pertinent negatives include no chest pain, headaches, neck pain, palpitations or shortness of breath. Risk factors for coronary artery disease include family history, dyslipidemia, obesity, male gender, sedentary lifestyle and diabetes mellitus. Past treatments include angiotensin blockers. Hypertensive end-organ damage includes kidney disease. Identifiable causes of hypertension include chronic renal disease.     Review of Systems  Constitutional: Negative for chills, fatigue, fever and unexpected weight change.  HENT: Negative for dental problem, mouth sores and trouble swallowing.   Eyes: Negative for visual disturbance.  Respiratory: Negative for cough, choking, chest tightness, shortness of breath and wheezing.    Cardiovascular: Negative for chest pain, palpitations and leg swelling.  Gastrointestinal: Negative for abdominal distention, abdominal pain, constipation, diarrhea, nausea and vomiting.  Endocrine: Positive for polydipsia and polyuria. Negative for polyphagia.  Genitourinary: Negative for dysuria, flank pain, hematuria and urgency.  Musculoskeletal: Negative for back pain, gait problem, myalgias and neck pain.  Skin: Negative for pallor, rash and wound.  Neurological: Negative for seizures, syncope, weakness, numbness and headaches.  Psychiatric/Behavioral: Negative for confusion and dysphoric mood.    Objective:    BP (!) 161/96   Pulse 98   Ht 6\' 1"  (1.854 m)   Wt (!) 372 lb (168.7 kg)   BMI 49.08 kg/m   Wt Readings from Last 3 Encounters:  07/16/19 (!) 372 lb (168.7 kg)  06/27/18 (!) 368 lb (166.9 kg)  04/25/18 (!) 368 lb 8 oz (167.2 kg)  Physical Exam Constitutional:      General: He is not in acute distress.    Appearance: He is well-developed.  HENT:     Head: Normocephalic and atraumatic.  Neck:     Musculoskeletal: Normal range of motion and neck supple.     Thyroid: No thyromegaly.     Trachea: No tracheal deviation.  Cardiovascular:     Rate and Rhythm: Normal rate.     Pulses:          Dorsalis pedis pulses are 1+ on the right side and 1+ on the left side.       Posterior tibial pulses are 1+ on the right side and 1+ on the left side.     Heart sounds: S1 normal and S2 normal. No murmur. No gallop.   Pulmonary:     Effort: Pulmonary effort is normal. No respiratory distress.     Breath sounds: No wheezing.  Abdominal:     General: There is no distension.     Tenderness: There is no abdominal tenderness. There is no guarding.  Musculoskeletal:     Right shoulder: He exhibits no swelling and no deformity.  Skin:    General: Skin is warm and dry.     Findings: No rash.     Nails: There is no clubbing.   Neurological:     Mental Status: He is alert  and oriented to person, place, and time.     Cranial Nerves: No cranial nerve deficit.     Sensory: No sensory deficit.     Gait: Gait normal.     Deep Tendon Reflexes: Reflexes are normal and symmetric.  Psychiatric:        Speech: Speech normal.        Behavior: Behavior normal. Behavior is cooperative.        Thought Content: Thought content normal.        Judgment: Judgment normal.       CMP ( most recent) CMP     Component Value Date/Time   BUN 16 12/04/2018   CREATININE 1.8 (A) 12/04/2018   CREATININE 1.90 (H) 03/06/2018 1706   GFRNONAA >60 11/09/2017 1045   GFRAA >60 11/09/2017 1045     Diabetic Labs (most recent): Lab Results  Component Value Date   HGBA1C 8.6 (A) 07/16/2019   HGBA1C 6.9 12/04/2018       Assessment & Plan:   1. Uncontrolled type 2 diabetes mellitus with hyperglycemia (HCC) - Robert [name] has currently uncontrolled symptomatic type 2 DM since  63 years of age,  with most recent A1c of 8.6 %. Recent labs reviewed. - I had a long discussion with him about the progressive nature of diabetes and the pathology behind its complications. 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 These are all discussed in detail with him.  - I have counseled him on diet  and weight management  by adopting a carbohydrate restricted/protein rich diet. Patient is encouraged to switch to  unprocessed or minimally processed     complex starch and increased protein intake (animal or plant source), fruits, and vegetables. -  he is advised to stick to a routine mealtimes to eat 3 meals  a day and avoid unnecessary snacks ( to snack only to correct hypoglycemia).   - he admits that there is a room for  improvement in his food and drink choices. - Suggestion is made for him to avoid simple carbohydrates   from his diet including Cakes, Sweet Desserts, Ice Cream, Soda (diet and regular), Sweet Tea, Candies, Chips, Cookies, Store Bought Juices, Alcohol in Excess of  1-2 drinks a day, Artificial Sweeteners,  Coffee Creamer, and "Sugar-free" Products. This will help patient to have more stable blood glucose profile and potentially avoid unintended weight gain.  - he will be scheduled with Jearld Fenton, RDN, CDE for diabetes education.  - I have approached him with the following individualized plan to manage  his diabetes and patient agrees:   - he will need additional treatment in order for him to achieve and maintain control of diabetes to target.    - he is advised to continue glipizide 5 mg p.o. twice daily-daily with breakfast and supper. - he is not a candidate for metformin, SGLT2 inhibitors due to concurrent renal insufficiency. -I discussed and initiated Januvia 50 mg p.o. daily at breakfast. -He is approached to start monitoring blood glucose at least at fasting. - he is encouraged to call clinic for blood glucose levels less than 70 or above 200 mg /dl. - Specific targets for  A1c;  LDL, HDL, Triglycerides, and  Waist Circumference were discussed with the patient.  2) Blood Pressure /Hypertension:  his blood pressure is uncontrolled to target.   he is advised to continue his current medications including losartan 100 mg p.o. daily at breakfast, I discussed and added hydrochlorothiazide 25 mg p.o. daily at breakfast.   3) Lipids/Hyperlipidemia:   Review of his recent lipid panel showed uncontrolled  LDL at 210 .  I discussed and increase his Crestor to 40 mg p.o. daily at bedtime. Side effects and precautions discussed with him.  4)  Weight/Diet:  Body mass index is 49.08 kg/m.  -   clearly complicating his diabetes care.   he is  a candidate for weight loss. I discussed with him the fact that loss of 5 - 10% of his  current body weight will have the most impact on his diabetes management.   Exercise, and detailed carbohydrates information provided  -  detailed on discharge instructions.  5) Chronic Care/Health Maintenance:  -he  is on ACEI/ARB and Statin medications and  is encouraged to initiate and continue to follow up with Ophthalmology, Dentist,  Podiatrist at least yearly or according to recommendations, and advised to   stay away from smoking. I have recommended yearly flu vaccine and pneumonia vaccine at least every 5 years; moderate intensity exercise for up to 150 minutes weekly; and  sleep for at least 7 hours a day.  - he is  advised to maintain close follow up with Sinda Du, MD for primary care needs, as well as his other providers for optimal and coordinated care.  - Time spent with the patient: 45 minutes, of which >50% was spent in obtaining information about his symptoms, reviewing his previous labs/studies, evaluations, and treatments, counseling him about his currently uncontrolled, complicated type 2 diabetes; morbid obesity, hyperlipidemia, hypertension, and developing plans for long term treatment based on the latest standards of care/guidelines.  Please refer to " Patient Self Inventory" in the Media  tab for reviewed elements of pertinent patient history.  Robert [name] participated in the discussions, expressed understanding, and voiced agreement with the above plans.  All questions were answered to his satisfaction. he is encouraged to contact clinic should he have any questions or concerns prior  to his return visit.  Follow up plan: - Return in about 3 months (around 10/16/2019) for Bring Meter and Logs- A1c in Office.  Glade Lloyd, MD [facility] [address] [place], [place] [address] Phone: [phone]  Fax: [phone]    07/16/2019, 10:06 AM  This note was partially dictated with voice recognition software. Similar sounding words can be transcribed inadequately or may not  be corrected  upon review.

## 2019-08-21 ENCOUNTER — Encounter: Payer: 59 | Attending: "Endocrinology | Admitting: Nutrition

## 2019-08-21 ENCOUNTER — Encounter: Payer: Self-pay | Admitting: Family Medicine

## 2019-08-21 ENCOUNTER — Encounter: Payer: Self-pay | Admitting: Nutrition

## 2019-08-21 ENCOUNTER — Other Ambulatory Visit: Payer: Self-pay

## 2019-08-21 ENCOUNTER — Ambulatory Visit: Payer: 59 | Admitting: Family Medicine

## 2019-08-21 VITALS — Ht 73.0 in | Wt 368.0 lb

## 2019-08-21 VITALS — BP 154/104 | HR 94 | Temp 98.0°F | Ht 73.0 in | Wt 368.0 lb

## 2019-08-21 DIAGNOSIS — I10 Essential (primary) hypertension: Secondary | ICD-10-CM | POA: Diagnosis not present

## 2019-08-21 DIAGNOSIS — Z125 Encounter for screening for malignant neoplasm of prostate: Secondary | ICD-10-CM | POA: Diagnosis not present

## 2019-08-21 DIAGNOSIS — E1165 Type 2 diabetes mellitus with hyperglycemia: Secondary | ICD-10-CM

## 2019-08-21 DIAGNOSIS — E782 Mixed hyperlipidemia: Secondary | ICD-10-CM | POA: Diagnosis present

## 2019-08-21 DIAGNOSIS — Z79899 Other long term (current) drug therapy: Secondary | ICD-10-CM

## 2019-08-21 LAB — POCT URINALYSIS DIP (CLINITEK)
Bilirubin, UA: NEGATIVE
Blood, UA: NEGATIVE
Glucose, UA: NEGATIVE mg/dL
Ketones, POC UA: NEGATIVE mg/dL
Leukocytes, UA: NEGATIVE
Nitrite, UA: NEGATIVE
POC PROTEIN,UA: NEGATIVE
Spec Grav, UA: 1.025 (ref 1.010–1.025)
Urobilinogen, UA: 0.2 E.U./dL
pH, UA: 6 (ref 5.0–8.0)

## 2019-08-21 MED ORDER — AMLODIPINE BESYLATE 5 MG PO TABS: 5.0000 mg | ORAL_TABLET | Freq: Every day | ORAL | 1 refills | Status: DC

## 2019-08-21 NOTE — Patient Instructions (Signed)
Goals  Lose 5 lbs per month Increase exercise 60 minutes 4-5 times per week Cut out sweets Drink water Increase vegetables with dinner Get A1C down to 7% or less.

## 2019-08-21 NOTE — Progress Notes (Signed)
  Medical Nutrition Therapy:  Appt start time: 0830 end time:  0900.   Assessment:  Primary concerns today:  OBesity and DM follow up  Lost 4 lbs Changes made: measuring foods out, adding more more vegetables. Eating more baked. Walking 30 miins a day. Feels a lot better.Jonette Mate three meals per day. Drinking more water.  FBS: 140-160's.   Bedtime  130's  Taking Glipizide and Januvia. Craves sweets at tnight To see Dr. Holly Bodily, new PCP today .  Lab Results  Component Value Date   HGBA1C 8.6 (A) 07/16/2019   CMP Latest Ref Rng & Units 12/04/2018 03/06/2018 12/12/2017  BUN 4 - 21 16 - -  Creatinine 0.6 - 1.3 1.8(A) 1.90(H) 1.00  Sodium 137 - 147 138 - -  Potassium 3.4 - 5.3 4.6 - -  Chloride 99 - 108 104 - -  CO2 13 - 22 26(A) - -  Calcium 8.7 - 10.7 9.2 - -  Alkaline Phos 25 - 125 77 - -  AST 14 - 40 8(A) - -  ALT 10 - 40 7(A) - -   Lipid Panel     Component Value Date/Time   CHOL 288 (A) 12/04/2018   TRIG 227 (A) 12/04/2018   HDL 36 12/04/2018   LDLCALC 210 12/04/2018     Preferred Learning Style:  No preference indicated   Learning Readiness:  Ready  Change in progress  MEDICATIONS:    DIETARY INTAKE:  24-hr recall:  B ( AM): Oatmeal with no sugar, apple, cashews, water, coffee Snk ( AM):   L ( PM): boiled chicken, whole wheat bread, apple, nuts, water Snk ( PM):  D ( PM): spaghetti, 1 slice bread, salad and water Snk ( PM):  Beverages: water   Usual physical activity: walks  Estimated energy needs: 1800-2000  calories 200 g carbohydrates 135 g protein 50 g fat  Progress Towards Goal(s):  In progress.   Nutritional Diagnosis:  NB-1.1 Food and nutrition-related knowledge deficit As related to OBesity and Typd 2 DM.  As evidenced by BMI 48 and A1C > 6.5%..    Intervention:  Nutrition and Diabetes education provided on My Plate, CHO counting, meal planning, portion sizes, timing of meals, avoiding snacks between meals unless having a low blood  sugar, target ranges for A1C and blood sugars, signs/symptoms and treatment of hyper/hypoglycemia, monitoring blood sugars, taking medications as prescribed, benefits of exercising 30 minutes per day and prevention of complications of DM. Marland KitchenGoals  Lose 5 lbs per month Increase exercise 60 minutes 4-5 times per week Cut out sweets Drink water Increase vegetables with dinner Get A1C down to 7% or less.   Teaching Method Utilized:  Visual Auditory Hands on  Handouts given during visit include:  The Plate Method   Meal Plan Card   Barriers to learning/adherence to lifestyle change: none  Demonstrated degree of understanding via:  Teach Back   Monitoring/Evaluation:  Dietary intake, exercise,  and body weight in 3 month(s).

## 2019-08-21 NOTE — Progress Notes (Signed)
New Patient Office Visit  Subjective:  Patient ID: Robert [name], male    DOB: Feb 27, 1956  Age: 63 y.o. MRN: [id number]  CC:  Chief Complaint  Patient presents with  . Establish Care  . Sinus Problem    x's 1 month  Congestion, nasal swelling with facial pressure intermittently. No fever No ear pain  HPI DARRIOUS YOUMAN presents for HTN-HCTZ/Cozaar daily-stable Endo -DM-following-recently added Januvia-glucose has not been well controlled-no taking glucose readings. Seen by diabetic education Headache-indomethacin 3 x daily-not related to neck tumor-Headache specialist following-takes prilosec taken with Indomethacin to decrease stomach upset-GERD stable HTN-taking HCTZ, Cozaar daily-pt does not take bp readings at home Past Medical History:  Diagnosis Date  . Diabetes mellitus without complication ([place])   . GERD (gastroesophageal reflux disease)   . Headache   . Hypertension     Past Surgical History:  Procedure Laterality Date  . COLONOSCOPY N/A 03/17/2017   Procedure: COLONOSCOPY;  Surgeon: Daneil Dolin, MD;  Location: [facility];  Service: Endoscopy;  Laterality: N/A;  10:30 AM  . NO PAST SURGERIES      Family History  Problem Relation Age of Onset  . Healthy Mother   . Healthy Father   . Colon cancer Neg Hx     Social History   Socioeconomic History  . Marital status: Married    Spouse name: Not on file  . Number of children: Not on file  . Years of education: Not on file  . Highest education level: Not on file  Occupational History  . Occupation: self empolyed  Social Needs  . Financial resource strain: Not on file  . Food insecurity    Worry: Not on file    Inability: Not on file  . Transportation needs    Medical: Not on file    Non-medical: Not on file  Tobacco Use  . Smoking status: Never Smoker  . Smokeless tobacco: Never Used  Substance and Sexual Activity  . Alcohol use: No  . Drug use: No  . Sexual activity: Yes  Lifestyle  .  Physical activity    Days per week: Not on file    Minutes per session: Not on file  . Stress: Not on file  Relationships  . Social Herbalist on phone: Not on file    Gets together: Not on file    Attends religious service: Not on file    Active member of club or organization: Not on file    Attends meetings of clubs or organizations: Not on file    Relationship status: Not on file  . Intimate partner violence    Fear of current or ex partner: Not on file    Emotionally abused: Not on file    Physically abused: Not on file    Forced sexual activity: Not on file  Other Topics Concern  . Not on file  Social History Narrative  . Not on file    ROS Review of Systems  Constitutional: Negative for fatigue.  HENT: Positive for hearing loss.        No hearing aids  Eyes:       No recent evaluation   Respiratory: Negative.   Cardiovascular: Negative.   Gastrointestinal: Negative.   Endocrine:       DM-not well controlled  Genitourinary:       Flomax  Musculoskeletal: Positive for neck pain.  Allergic/Immunologic: Positive for environmental allergies.  Neurological: Positive for headaches.  Hematological: Negative.     Objective:   Today's Vitals: BP (!) 154/104   Pulse 94   Temp 98 F (36.7 C) (Oral)   Ht 6\' 1"  (1.854 m)   Wt (!) 368 lb (166.9 kg)   SpO2 96%   BMI 48.55 kg/m   Physical Exam Constitutional:      Appearance: He is obese.  HENT:     Head: Normocephalic and atraumatic.     Right Ear: Tympanic membrane and ear canal normal.     Left Ear: Tympanic membrane and ear canal normal.     Nose: Nose normal.  Eyes:     Conjunctiva/sclera: Conjunctivae normal.  Neck:     Musculoskeletal: Normal range of motion and neck supple.  Cardiovascular:     Rate and Rhythm: Normal rate.     Pulses: Normal pulses.     Heart sounds: Normal heart sounds.  Pulmonary:     Effort: Pulmonary effort is normal.     Breath sounds: Normal breath sounds.   Neurological:     Mental Status: He is alert and oriented to person, place, and time.  Psychiatric:        Mood and Affect: Mood normal.        Behavior: Behavior normal.     Assessment & Plan:    Outpatient Encounter Medications as of 08/21/2019  Medication Sig  . glipiZIDE (GLUCOTROL) 5 MG tablet Take by mouth 2 (two) times daily before a meal.  . hydrochlorothiazide (HYDRODIURIL) 25 MG tablet Take 1 tablet (25 mg total) by mouth daily.  . indomethacin (INDOCIN SR) 75 MG CR capsule Take 75 mg by mouth 3 (three) times daily.  Marland Kitchen losartan (COZAAR) 100 MG tablet Take 100 mg by mouth daily.  Marland Kitchen omeprazole (PRILOSEC) 40 MG capsule Take 40 mg by mouth daily.  . rosuvastatin (CRESTOR) 40 MG tablet Take 1 tablet (40 mg total) by mouth at bedtime.  . sitaGLIPtin (JANUVIA) 50 MG tablet Take 1 tablet (50 mg total) by mouth daily.  . tamsulosin (FLOMAX) 0.4 MG CAPS capsule Take by mouth daily.   No facility-administered encounter medications on file as of 08/21/2019.    1. Uncontrolled type 2 diabetes mellitus with hyperglycemia ([place]) - Ambulatory referral to Ophthalmology - COMPLETE METABOLIC PANEL WITH GFR - Hemoglobin A1c - Lipid panel Recently Januvia added-pt needs updated labwork , eye exam, foot exam at f/u in 1 month 2. Essential hypertension, benign Elevated today-ongoing headaches-add amlodipine daily-recheck in 1 month - POCT URINALYSIS DIP ([facility]) - COMPLETE METABOLIC PANEL WITH GFR - Lipid panel  3. High risk medication use indomethicin-tumor and headaches-pt feels best medication-risk for stomach upset - CBC with Differential  4. Prostate cancer screening - PSA Pt understand screening test for prostate CA -risk/benefit of testing discussed-for abnormal findings pt agrees to urology referral 5. Mixed hyperlipidemia - Lipid panel Taking Crestor daily Follow-up: 1 month to recheck blood pressure and review labwork Jovontae Banko Hannah Beat, MD

## 2019-08-21 NOTE — Patient Instructions (Addendum)
Take Cozaar in the morning Take HCTZ in the morning Take amlodipine 5mg  (NEW MEDICATION) for blood pressure night Continue to take Crestor at night Eye specialist  Fasting labwork-JAN 2021

## 2019-08-22 ENCOUNTER — Encounter: Payer: Self-pay | Admitting: Nutrition

## 2019-08-24 ENCOUNTER — Encounter: Payer: Self-pay | Admitting: Family Medicine

## 2019-09-18 ENCOUNTER — Telehealth (INDEPENDENT_AMBULATORY_CARE_PROVIDER_SITE_OTHER): Payer: 59 | Admitting: Family Medicine

## 2019-09-18 ENCOUNTER — Other Ambulatory Visit: Payer: Self-pay

## 2019-09-18 DIAGNOSIS — E782 Mixed hyperlipidemia: Secondary | ICD-10-CM | POA: Diagnosis not present

## 2019-09-18 DIAGNOSIS — I10 Essential (primary) hypertension: Secondary | ICD-10-CM

## 2019-09-18 DIAGNOSIS — Z79899 Other long term (current) drug therapy: Secondary | ICD-10-CM

## 2019-09-18 NOTE — Progress Notes (Signed)
Virtual Visit via Telephone Note  I connected with Robert [name] on 09/18/19 at  8:20 AM EST by telephone and verified that I am speaking with the correct person using two identifiers. DOB/address Location: Patient: home Provider: clinic   I discussed the limitations, risks, security and privacy concerns of performing an evaluation and management service by telephone and the availability of in person appointments. I also discussed with the patient that there may be a patient responsible charge related to this service. The patient expressed understanding and agreed to proceed.   History of Present Illness: DM-pt taking Januvia given by endo HTN-taking HCTZ, Cozaar daily-no cough , headache improved-bp readings Hyperlipidemia-Crestor daily Observations/Objective: No vital signs  Assessment and Plan: 1. Essential hypertension, benign Continue medications as discussed Fasting labwork-send lab orders  2. Mixed hyperlipidemia Fasting labwork-send lab orders  Follow Up Instructions: Fasting labwork-order for blood work Continue taking blood pressure readings daily Continue taking glucose readings daily Write down for continued follow up    I discussed the assessment and treatment plan with the patient. The patient was provided an opportunity to ask questions and all were answered. The patient agreed with the plan and demonstrated an understanding of the instructions.   The patient was advised to call back or seek an in-person evaluation if the symptoms worsen or if the condition fails to improve as anticipated.  I provided 8 minutes of non-face-to-face time during this encounter.   [name] Hannah Beat, MD

## 2019-09-19 NOTE — Patient Instructions (Addendum)
Fasting labwork Review labwork and blood pressure readings after completed-write down bp readings Keep appointments with Endo and Nutrition-really important to control diabetes!

## 2019-10-17 ENCOUNTER — Other Ambulatory Visit: Payer: Self-pay | Admitting: Family Medicine

## 2019-10-17 ENCOUNTER — Ambulatory Visit: Payer: 59 | Admitting: "Endocrinology

## 2019-10-17 ENCOUNTER — Ambulatory Visit: Payer: 59 | Admitting: Nutrition

## 2019-10-29 ENCOUNTER — Ambulatory Visit: Payer: 59 | Admitting: Family Medicine

## 2019-11-06 ENCOUNTER — Encounter: Payer: Self-pay | Admitting: Family Medicine

## 2019-11-06 ENCOUNTER — Telehealth (INDEPENDENT_AMBULATORY_CARE_PROVIDER_SITE_OTHER): Payer: 59 | Admitting: Family Medicine

## 2019-11-06 VITALS — BP 154/104 | Ht 73.0 in | Wt 368.0 lb

## 2019-11-06 DIAGNOSIS — I10 Essential (primary) hypertension: Secondary | ICD-10-CM

## 2019-11-06 DIAGNOSIS — E1165 Type 2 diabetes mellitus with hyperglycemia: Secondary | ICD-10-CM | POA: Diagnosis not present

## 2019-11-06 NOTE — Progress Notes (Signed)
Virtual Visit via Telephone Note  I connected with Martie Round on 11/06/19 at  8:40 AM EST by telephone and verified that I am speaking with the correct person using two identifiers. DOB/address  Location: Patient: home Provider: clinic   I discussed the limitations, risks, security and privacy concerns of performing an evaluation and management service by telephone and the availability of in person appointments. I also discussed with the patient that there may be a patient responsible charge related to this service. The patient expressed understanding and agreed to proceed.   History of Present Illness: DM-170-130 fasting glucose-pt is not able to take [place] or glucotrol prior to going to work. Pt states he gets "dizzy" at work if taking medication. Pt has not check glucose with dizziness to see if correlation with low glucose. Pt states he takes Januvia at night and glucotrol at night(pt on BID formulation). Pt has not been able to complete blood work-temp elevation at pharmacy on Monday-COVID test pending-exposure at [employer]. Pt with no symptoms. Pt has not been able to purchase a bp monitor. NO clinic at work.      Observations/Objective: Glucose readings 140-170  Assessment and Plan:  1. Essential hypertension, benign Pt taking HCTZ and Losartan in the morning and amlodipine(new start last ov) in the evening-bp unknown-virtual visit and no bp cuff Pt will obtain bp cuff for monitoring-concern for dizziness-? bp vs glucose at work  2. Uncontrolled type 2 diabetes mellitus with hyperglycemia ([facility]) Continue Januvia at night. Switch glucotrol to 24 hour taking medication at night to decrease dizziness at work. Pt will complete labwork after testing completed-will follow up with pt after labwork Follow Up Instructions: Fasting labwork Obtain bp cuff if possible   I discussed the assessment and treatment plan with the patient. The patient was provided an  opportunity to ask questions and all were answered. The patient agreed with the plan and demonstrated an understanding of the instructions.   The patient was advised to call back or seek an in-person evaluation if the symptoms worsen -dizziness concerning with no bp monitor-take glucose monitor to work to check at time of symptoms I provided 15 minutes of non-face-to-face time during this encounter.   Zelphia Glover Hannah Beat, MD

## 2019-11-13 ENCOUNTER — Encounter: Payer: 59 | Attending: "Endocrinology | Admitting: Nutrition

## 2019-11-13 ENCOUNTER — Ambulatory Visit: Payer: 59 | Admitting: "Endocrinology

## 2019-11-13 ENCOUNTER — Other Ambulatory Visit: Payer: Self-pay

## 2019-11-13 ENCOUNTER — Encounter: Payer: Self-pay | Admitting: "Endocrinology

## 2019-11-13 VITALS — BP 154/96 | HR 94 | Ht 73.0 in | Wt 381.4 lb

## 2019-11-13 DIAGNOSIS — E782 Mixed hyperlipidemia: Secondary | ICD-10-CM

## 2019-11-13 DIAGNOSIS — E1165 Type 2 diabetes mellitus with hyperglycemia: Secondary | ICD-10-CM

## 2019-11-13 DIAGNOSIS — N184 Chronic kidney disease, stage 4 (severe): Secondary | ICD-10-CM

## 2019-11-13 DIAGNOSIS — I10 Essential (primary) hypertension: Secondary | ICD-10-CM | POA: Diagnosis not present

## 2019-11-13 LAB — POCT GLYCOSYLATED HEMOGLOBIN (HGB A1C): Hemoglobin A1C: 7.3 % — AB (ref 4.0–5.6)

## 2019-11-13 MED ORDER — SITAGLIPTIN PHOSPHATE 50 MG PO TABS: 50.0000 mg | ORAL_TABLET | Freq: Every day | ORAL | 3 refills | Status: DC

## 2019-11-13 NOTE — Patient Instructions (Signed)

## 2019-11-13 NOTE — Progress Notes (Signed)
11/13/2019, 11:48 AM  Endocrinology follow-up note   Subjective:    Patient ID: Robert [name], male    DOB: 16-Apr-1956.  Robert [name] is being seen in follow-up after he was seen in consultation for management of currently uncontrolled symptomatic diabetes requested by  Maryruth Hancock, MD.   Past Medical History:  Diagnosis Date  . Diabetes mellitus without complication ([place])   . GERD (gastroesophageal reflux disease)   . Headache   . Hypertension     Past Surgical History:  Procedure Laterality Date  . COLONOSCOPY N/A 03/17/2017   Procedure: COLONOSCOPY;  Surgeon: Daneil Dolin, MD;  Location: [facility];  Service: Endoscopy;  Laterality: N/A;  10:30 AM  . NO PAST SURGERIES      Social History   Socioeconomic History  . Marital status: Married    Spouse name: Not on file  . Number of children: Not on file  . Years of education: Not on file  . Highest education level: Not on file  Occupational History  . Occupation: self empolyed  Tobacco Use  . Smoking status: Never Smoker  . Smokeless tobacco: Never Used  Substance and Sexual Activity  . Alcohol use: No  . Drug use: No  . Sexual activity: Yes  Other Topics Concern  . Not on file  Social History Narrative  . Not on file   Social Determinants of Health   Financial Resource Strain:   . Difficulty of Paying Living Expenses: Not on file  Food Insecurity:   . Worried About Charity fundraiser in the Last Year: Not on file  . Ran Out of Food in the Last Year: Not on file  Transportation Needs:   . Lack of Transportation (Medical): Not on file  . Lack of Transportation (Non-Medical): Not on file  Physical Activity:   . Days of Exercise per Week: Not on file  . Minutes of Exercise per Session: Not on file  Stress:   . Feeling of Stress : Not on file  Social Connections:   . Frequency of Communication with Friends and  Family: Not on file  . Frequency of Social Gatherings with Friends and Family: Not on file  . Attends Religious Services: Not on file  . Active Member of Clubs or Organizations: Not on file  . Attends Archivist Meetings: Not on file  . Marital Status: Not on file    Family History  Problem Relation Age of Onset  . Healthy Mother   . Healthy Father   . Colon cancer Neg Hx     Outpatient Encounter Medications as of 11/13/2019  Medication Sig  . amLODipine (NORVASC) 5 MG tablet TAKE ONE (1) TABLET BY MOUTH EVERY DAY  . glipiZIDE (GLUCOTROL XL) 5 MG 24 hr tablet Take 5 mg by mouth daily with lunch.  . hydrochlorothiazide (HYDRODIURIL) 25 MG tablet Take 1 tablet (25 mg total) by mouth daily.  . indomethacin (INDOCIN SR) 75 MG CR capsule Take 75 mg by mouth 3 (three) times daily.  Marland Kitchen losartan (COZAAR) 100 MG tablet Take 100 mg by mouth daily.  Marland Kitchen omeprazole (  PRILOSEC) 40 MG capsule Take 40 mg by mouth daily.  . rosuvastatin (CRESTOR) 40 MG tablet Take 1 tablet (40 mg total) by mouth at bedtime.  . sitaGLIPtin (JANUVIA) 50 MG tablet Take 1 tablet (50 mg total) by mouth daily with lunch.  . tamsulosin (FLOMAX) 0.4 MG CAPS capsule Take by mouth daily.  . [DISCONTINUED] sitaGLIPtin (JANUVIA) 50 MG tablet Take 1 tablet (50 mg total) by mouth daily.   No facility-administered encounter medications on file as of 11/13/2019.    ALLERGIES: Allergies  Allergen Reactions  . Penicillins     VACCINATION STATUS: Immunization History  Administered Date(s) Administered  . Influenza-Unspecified 07/26/2011, 08/10/2015    Diabetes He presents for his follow-up diabetic visit. He has type 2 diabetes mellitus. Onset time: He was diagnosed at approximate age of 82 years. His disease course has been improving. There are no hypoglycemic associated symptoms. Pertinent negatives for hypoglycemia include no confusion, headaches, pallor or seizures. Pertinent negatives for diabetes include no chest  pain, no fatigue, no polydipsia, no polyphagia, no polyuria and no weakness. There are no hypoglycemic complications. Symptoms are improving. Diabetic complications include nephropathy. Risk factors for coronary artery disease include diabetes mellitus, dyslipidemia, family history, male sex, obesity, hypertension and sedentary lifestyle. Current diabetic treatment includes oral agent (monotherapy). His weight is increasing steadily. He is following a generally unhealthy diet. When asked about meal planning, he reported none. He has not had a previous visit with a dietitian. He never participates in exercise. His home blood glucose trend is decreasing steadily. (He does not monitor blood glucose regularly.  His point-of-care A1c today was 7.3% improving from 8.3%.    ) An ACE inhibitor/angiotensin II receptor blocker is being taken. Eye exam is current.  Hyperlipidemia This is a chronic problem. The current episode started more than 1 year ago. The problem is uncontrolled. Exacerbating diseases include chronic renal disease, diabetes and obesity. Pertinent negatives include no chest pain, myalgias or shortness of breath. Current antihyperlipidemic treatment includes statins. Risk factors for coronary artery disease include diabetes mellitus, dyslipidemia, family history, obesity, male sex, hypertension and a sedentary lifestyle.  Hypertension This is a chronic problem. The current episode started more than 1 year ago. Pertinent negatives include no chest pain, headaches, neck pain, palpitations or shortness of breath. Risk factors for coronary artery disease include family history, dyslipidemia, obesity, male gender, sedentary lifestyle and diabetes mellitus. Past treatments include angiotensin blockers. Hypertensive end-organ damage includes kidney disease. Identifiable causes of hypertension include chronic renal disease.     Review of Systems  Constitutional: Negative for chills, fatigue, fever and  unexpected weight change.  HENT: Negative for dental problem, mouth sores and trouble swallowing.   Eyes: Negative for visual disturbance.  Respiratory: Negative for cough, choking, chest tightness, shortness of breath and wheezing.   Cardiovascular: Negative for chest pain, palpitations and leg swelling.  Gastrointestinal: Negative for abdominal distention, abdominal pain, constipation, diarrhea, nausea and vomiting.  Endocrine: Negative for polydipsia, polyphagia and polyuria.  Genitourinary: Negative for dysuria, flank pain, hematuria and urgency.  Musculoskeletal: Negative for back pain, gait problem, myalgias and neck pain.  Skin: Negative for pallor, rash and wound.  Neurological: Negative for seizures, syncope, weakness, numbness and headaches.  Psychiatric/Behavioral: Negative for confusion and dysphoric mood.    Objective:    BP (!) 154/96   Pulse 94   Ht 6\' 1"  (1.854 m)   Wt (!) 381 lb 6.4 oz (173 kg)   BMI 50.32 kg/m   Wt  Readings from Last 3 Encounters:  11/13/19 (!) 381 lb 6.4 oz (173 kg)  11/06/19 (!) 368 lb (166.9 kg)  08/21/19 (!) 368 lb (166.9 kg)     Physical Exam Constitutional:      General: He is not in acute distress.    Appearance: He is well-developed.  HENT:     Head: Normocephalic and atraumatic.  Neck:     Thyroid: No thyromegaly.     Trachea: No tracheal deviation.  Cardiovascular:     Rate and Rhythm: Normal rate.     Pulses:          Dorsalis pedis pulses are 1+ on the right side and 1+ on the left side.       Posterior tibial pulses are 1+ on the right side and 1+ on the left side.     Heart sounds: S1 normal and S2 normal. No murmur. No gallop.   Pulmonary:     Effort: Pulmonary effort is normal. No respiratory distress.     Breath sounds: No wheezing.  Abdominal:     General: There is no distension.     Tenderness: There is no abdominal tenderness. There is no guarding.  Musculoskeletal:     Right shoulder: No swelling or deformity.      Cervical back: Normal range of motion and neck supple.  Skin:    General: Skin is warm and dry.     Findings: No rash.     Nails: There is no clubbing.  Neurological:     Mental Status: He is alert and oriented to person, place, and time.     Cranial Nerves: No cranial nerve deficit.     Sensory: No sensory deficit.     Gait: Gait normal.     Deep Tendon Reflexes: Reflexes are normal and symmetric.  Psychiatric:        Speech: Speech normal.        Behavior: Behavior normal. Behavior is cooperative.        Thought Content: Thought content normal.        Judgment: Judgment normal.     CMP ( most recent) CMP     Component Value Date/Time   NA 138 12/04/2018 0000   K 4.6 12/04/2018 0000   CL 104 12/04/2018 0000   CO2 26 (A) 12/04/2018 0000   BUN 16 12/04/2018 0000   CREATININE 1.8 (A) 12/04/2018 0000   CREATININE 1.90 (H) 03/06/2018 1706   CALCIUM 9.2 12/04/2018 0000   ALBUMIN 3.8 12/04/2018 0000   AST 8 (A) 12/04/2018 0000   ALT 7 (A) 12/04/2018 0000   ALKPHOS 77 12/04/2018 0000   GFRNONAA 40 12/04/2018 0000   GFRAA 47 12/04/2018 0000     Diabetic Labs (most recent): Lab Results  Component Value Date   HGBA1C 7.3 (A) 11/13/2019   HGBA1C 8.6 (A) 07/16/2019   HGBA1C 6.9 12/04/2018       Assessment & Plan:   1. Uncontrolled type 2 diabetes mellitus with hyperglycemia (HCC) - Robert [name] has currently uncontrolled symptomatic type 2 DM since  64 years of age. His point-of-care A1c today 7.3% improving from 8.6%.  - I had a long discussion with him about the progressive nature of diabetes and the pathology behind its complications. 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 These are all discussed in detail  with him.  - I have counseled him on diet  and weight management  by adopting a  carbohydrate restricted/protein rich diet. Patient is encouraged to switch to  unprocessed or minimally processed     complex starch and increased protein intake (animal or plant source), fruits, and vegetables. -  he is advised to stick to a routine mealtimes to eat 3 meals  a day and avoid unnecessary snacks ( to snack only to correct hypoglycemia).   - he  admits there is a room for improvement in his diet and drink choices. -  Suggestion is made for him to avoid simple carbohydrates  from his diet including Cakes, Sweet Desserts / Pastries, Ice Cream, Soda (diet and regular), Sweet Tea, Candies, Chips, Cookies, Sweet Pastries,  Store Bought Juices, Alcohol in Excess of  1-2 drinks a day, Artificial Sweeteners, Coffee Creamer, and "Sugar-free" Products. This will help patient to have stable blood glucose profile and potentially avoid unintended weight gain.  - he will be scheduled with Jearld Fenton, RDN, CDE for diabetes education.  - I have approached him with the following individualized plan to manage  his diabetes and patient agrees:   -Based on his presentation with improved A1c of 7.3%, he will not need insulin treatment for now.   - he is advised to continue glipizide 5 mg cell p.o. daily at breakfast.    - he is not a candidate for metformin, SGLT2 inhibitors due to concurrent renal insufficiency. -He is advised to continue Januvia 50 mg p.o. daily at breakfast. -He is approached to start monitoring blood glucose at least at fasting. - he is encouraged to call clinic for blood glucose levels less than 70 or above 200 mg /dl. - Specific targets for  A1c;  LDL, HDL, Triglycerides, and  Waist Circumference were discussed with the patient.  2) Blood Pressure /Hypertension:  his blood pressure is not controlled to target.    he is advised to continue his current medications including losartan 100 mg p.o. daily at breakfast, I discussed and added hydrochlorothiazide 25 mg p.o. daily at  breakfast.   3) Lipids/Hyperlipidemia:   Review of his recent lipid panel showed uncontrolled  LDL at 210 .  He is advised to continue Crestor 40 mg p.o. daily at bedtime.   Side effects and precautions discussed with him.  4)  Weight/Diet:  Body mass index is 50.32 kg/m.  -   clearly complicating his diabetes care.   he is  a candidate for major weight loss. I discussed with him the fact that loss of 5 - 10% of his  current body weight will have the most impact on his diabetes management.  Exercise, and detailed carbohydrates information provided  -  detailed on discharge instructions.  He is a candidate for bariatric surgery, will be discussed in detail on subsequent visits.  5) Chronic Care/Health Maintenance:  -he  is on ACEI/ARB and Statin medications and  is encouraged to initiate and continue to follow up with Ophthalmology, Dentist,  Podiatrist at least yearly or according to recommendations, and advised to   stay away from smoking. I have recommended yearly flu vaccine and pneumonia vaccine at least every 5 years; moderate intensity exercise for up to 150 minutes weekly; and  sleep for at least 7 hours a day.  - he is  advised to maintain close follow up with [place], Rex Kras, MD for primary care needs, as well as his other providers for optimal and coordinated care.   -  Time spent on this patient care encounter:  35 min, of which > 50% was spent in  counseling and the rest reviewing his blood glucose logs , discussing his hypoglycemia and hyperglycemia episodes, reviewing his current and  previous labs / studies  ( including abstraction from other facilities) and medications  doses and developing a  long term treatment plan and documenting his care.   Please refer to Patient Instructions for Blood Glucose Monitoring and Insulin/Medications Dosing Guide"  in media tab for additional information. Please  also refer to " Patient Self Inventory" in the Media  tab for reviewed elements of pertinent  patient history.  Robert [name] participated in the discussions, expressed understanding, and voiced agreement with the above plans.  All questions were answered to his satisfaction. he is encouraged to contact clinic should he have any questions or concerns prior to his return visit.   Follow up plan: - Return in about 4 months (around 03/12/2020) for Bring Meter and Logs- A1c in Office, Follow up with Pre-visit Labs.  Glade Lloyd, MD [facility] [address] [place],  [address] Phone: [phone]  Fax: [phone]    11/13/2019, 11:48 AM  This note was partially dictated with voice recognition software. Similar sounding words can be transcribed inadequately or may not  be corrected upon review.

## 2019-11-14 ENCOUNTER — Encounter: Payer: Self-pay | Admitting: Nutrition

## 2019-11-14 NOTE — Patient Instructions (Signed)
Lose 5 lbs or more in the next 3 months Increase exercise 60 minutes 4-5 times per week Avoid sweets. Drink water Increase vegetables with dinner Get A1C down to 7% or less.

## 2019-11-14 NOTE — Progress Notes (Signed)
  Medical Nutrition Therapy:  Appt start time: 0915 end time:  0945   Assessment:  Primary concerns today:  OBesity and DM follow up  Here to see Dr. Dorris Fetch today also. Frustrated he gained weight. Has winter clothes on. Admits to not exercising as much due to colder weather. Motivated to get out and exercise. Changes made: measuring foods out, adding more more vegetables. Eating more baked.  A1C down to 7.3% from 8.6%. Taking Januvia and Glipizide. Making great progress with diet, just needs to increase physical activity and watch portions. Creatine improved slightly 1.8 mg/dl. CKD Stg 4 Hyperlipidemia should improve based on better blood sugars and lower fat diet.  Lab Results  Component Value Date   HGBA1C 7.3 (A) 11/13/2019    CMP Latest Ref Rng & Units 12/04/2018 03/06/2018 12/12/2017  BUN 4 - 21 16 - -  Creatinine 0.6 - 1.3 1.8(A) 1.90(H) 1.00  Sodium 137 - 147 138 - -  Potassium 3.4 - 5.3 4.6 - -  Chloride 99 - 108 104 - -  CO2 13 - 22 26(A) - -  Calcium 8.7 - 10.7 9.2 - -  Alkaline Phos 25 - 125 77 - -  AST 14 - 40 8(A) - -  ALT 10 - 40 7(A) - -   Lipid Panel     Component Value Date/Time   CHOL 288 (A) 12/04/2018 0000   TRIG 227 (A) 12/04/2018 0000   HDL 36 12/04/2018 0000   LDLCALC 210 12/04/2018 0000     Preferred Learning Style:  No preference indicated   Learning Readiness:  Ready  Change in progress  MEDICATIONS:    DIETARY INTAKE:  24-hr recall:  B ( AM): Oatmeal with no sugar, apple, cashews, water, coffee Snk ( AM):   L ( PM): boiled chicken, whole wheat bread, apple, nuts, water Snk ( PM):  D ( PM): spaghetti, 1 slice bread, salad and water Snk ( PM):  Beverages: water   Usual physical activity: walks  Estimated energy needs: 1800-2000  calories 200 g carbohydrates 135 g protein 50 g fat  Progress Towards Goal(s):  In progress.   Nutritional Diagnosis:  NB-1.1 Food and nutrition-related knowledge deficit As related to OBesity and  Typd 2 DM.  As evidenced by BMI 48 and A1C > 6.5%..    Intervention:  Nutrition and Diabetes education provided on My Plate, CHO counting, meal planning, portion sizes, timing of meals, avoiding snacks between meals unless having a low blood sugar, target ranges for A1C and blood sugars, signs/symptoms and treatment of hyper/hypoglycemia, monitoring blood sugars, taking medications as prescribed, benefits of exercising 30 minutes per day and prevention of complications of DM. Low Sodium diet. .Goals  Lose 5 lbs or more in the next 3 months Increase exercise 60 minutes 4-5 times per week Avoid sweets. Drink water Increase vegetables with dinner Get A1C down to 7% or less.   Teaching Method Utilized:  Visual Auditory Hands on  Handouts given during visit include:  The Plate Method   Meal Plan Card   Barriers to learning/adherence to lifestyle change: none  Demonstrated degree of understanding via:  Teach Back   Monitoring/Evaluation:  Dietary intake, exercise,  and body weight in 3 month(s).

## 2019-11-15 ENCOUNTER — Other Ambulatory Visit: Payer: Self-pay | Admitting: Family Medicine

## 2019-11-26 ENCOUNTER — Ambulatory Visit: Payer: 59 | Admitting: Family Medicine

## 2019-12-16 ENCOUNTER — Other Ambulatory Visit: Payer: Self-pay | Admitting: Family Medicine

## 2019-12-23 ENCOUNTER — Other Ambulatory Visit: Payer: Self-pay | Admitting: Family Medicine

## 2020-02-04 LAB — CBC WITH DIFFERENTIAL/PLATELET
Absolute Monocytes: 670 cells/uL (ref 200–950)
Basophils Absolute: 39 cells/uL (ref 0–200)
Basophils Relative: 0.6 %
Eosinophils Absolute: 241 cells/uL (ref 15–500)
Eosinophils Relative: 3.7 %
HCT: 39.9 % (ref 38.5–50.0)
Hemoglobin: 13 g/dL — ABNORMAL LOW (ref 13.2–17.1)
Lymphs Abs: 2678 cells/uL (ref 850–3900)
MCH: 28.4 pg (ref 27.0–33.0)
MCHC: 32.6 g/dL (ref 32.0–36.0)
MCV: 87.1 fL (ref 80.0–100.0)
MPV: 12 fL (ref 7.5–12.5)
Monocytes Relative: 10.3 %
Neutro Abs: 2873 cells/uL (ref 1500–7800)
Neutrophils Relative %: 44.2 %
Platelets: 158 10*3/uL (ref 140–400)
RBC: 4.58 10*6/uL (ref 4.20–5.80)
RDW: 13.3 % (ref 11.0–15.0)
Total Lymphocyte: 41.2 %
WBC: 6.5 10*3/uL (ref 3.8–10.8)

## 2020-02-04 LAB — LIPID PANEL
Cholesterol: 167 mg/dL (ref <200)
HDL: 29 mg/dL — ABNORMAL LOW (ref >=40)
LDL Cholesterol (Calc): 109 mg/dL (calc) — ABNORMAL HIGH
Non-HDL Cholesterol (Calc): 138 mg/dL (calc) — ABNORMAL HIGH (ref <130)
Total CHOL/HDL Ratio: 5.8 (calc) — ABNORMAL HIGH (ref <5.0)
Triglycerides: 170 mg/dL — ABNORMAL HIGH (ref <150)

## 2020-02-04 LAB — COMPLETE METABOLIC PANEL WITH GFR
AG Ratio: 1.2 (calc) (ref 1.0–2.5)
ALT: 17 U/L (ref 9–46)
AST: 17 U/L (ref 10–35)
Albumin: 3.7 g/dL (ref 3.6–5.1)
Alkaline phosphatase (APISO): 72 U/L (ref 35–144)
BUN/Creatinine Ratio: 12 (calc) (ref 6–22)
BUN: 21 mg/dL (ref 7–25)
CO2: 28 mmol/L (ref 20–32)
Calcium: 8.9 mg/dL (ref 8.6–10.3)
Chloride: 108 mmol/L (ref 98–110)
Creat: 1.69 mg/dL — ABNORMAL HIGH (ref 0.70–1.25)
GFR, Est African American: 49 mL/min/{1.73_m2} — ABNORMAL LOW (ref >=60)
GFR, Est Non African American: 43 mL/min/{1.73_m2} — ABNORMAL LOW (ref >=60)
Globulin: 3.1 g/dL (calc) (ref 1.9–3.7)
Glucose, Bld: 131 mg/dL — ABNORMAL HIGH (ref 65–99)
Potassium: 4.7 mmol/L (ref 3.5–5.3)
Sodium: 140 mmol/L (ref 135–146)
Total Bilirubin: 0.3 mg/dL (ref 0.2–1.2)
Total Protein: 6.8 g/dL (ref 6.1–8.1)

## 2020-02-04 LAB — HEMOGLOBIN A1C
Hgb A1c MFr Bld: 6.5 % of total Hgb — ABNORMAL HIGH (ref <5.7)
Mean Plasma Glucose: 140 (calc)
eAG (mmol/L): 7.7 (calc)

## 2020-02-04 LAB — PSA: PSA: 0.5 ng/mL (ref <=4.0)

## 2020-02-24 ENCOUNTER — Other Ambulatory Visit: Payer: Self-pay | Admitting: "Endocrinology

## 2020-02-24 ENCOUNTER — Other Ambulatory Visit: Payer: Self-pay | Admitting: Family Medicine

## 2020-02-25 ENCOUNTER — Other Ambulatory Visit: Payer: Self-pay | Admitting: Family Medicine

## 2020-03-12 ENCOUNTER — Other Ambulatory Visit: Payer: Self-pay

## 2020-03-12 ENCOUNTER — Ambulatory Visit (INDEPENDENT_AMBULATORY_CARE_PROVIDER_SITE_OTHER): Payer: 59 | Admitting: "Endocrinology

## 2020-03-12 ENCOUNTER — Ambulatory Visit: Payer: 59 | Admitting: Nutrition

## 2020-03-12 ENCOUNTER — Encounter: Payer: Self-pay | Admitting: "Endocrinology

## 2020-03-12 VITALS — BP 166/86 | HR 96 | Ht 73.0 in | Wt >= 6400 oz

## 2020-03-12 DIAGNOSIS — E782 Mixed hyperlipidemia: Secondary | ICD-10-CM

## 2020-03-12 DIAGNOSIS — I10 Essential (primary) hypertension: Secondary | ICD-10-CM | POA: Diagnosis not present

## 2020-03-12 DIAGNOSIS — E1165 Type 2 diabetes mellitus with hyperglycemia: Secondary | ICD-10-CM | POA: Diagnosis not present

## 2020-03-12 MED ORDER — CLONIDINE HCL 0.1 MG PO TABS: 0.1000 mg | ORAL_TABLET | Freq: Two times a day (BID) | ORAL | 1 refills | Status: DC

## 2020-03-12 NOTE — Progress Notes (Signed)
03/12/2020, 9:08 AM  Endocrinology follow-up note   Subjective:    Patient ID: Robert [name], male    DOB: 05-04-1956.  Robert [name] is being seen in follow-up after he was seen in consultation for management of currently uncontrolled symptomatic type 2 diabetes, hyperlipidemia, and weight management .  PMD;  Doree Albee, MD.   Past Medical History:  Diagnosis Date  . Diabetes mellitus without complication ([place])   . GERD (gastroesophageal reflux disease)   . Headache   . Hypertension     Past Surgical History:  Procedure Laterality Date  . COLONOSCOPY N/A 03/17/2017   Procedure: COLONOSCOPY;  Surgeon: Daneil Dolin, MD;  Location: [facility];  Service: Endoscopy;  Laterality: N/A;  10:30 AM  . NO PAST SURGERIES      Social History   Socioeconomic History  . Marital status: Married    Spouse name: Not on file  . Number of children: Not on file  . Years of education: Not on file  . Highest education level: Not on file  Occupational History  . Occupation: self empolyed  Tobacco Use  . Smoking status: Never Smoker  . Smokeless tobacco: Never Used  Vaping Use  . Vaping Use: Never used  Substance and Sexual Activity  . Alcohol use: No  . Drug use: No  . Sexual activity: Yes  Other Topics Concern  . Not on file  Social History Narrative  . Not on file   Social Determinants of Health   Financial Resource Strain:   . Difficulty of Paying Living Expenses:   Food Insecurity:   . Worried About Charity fundraiser in the Last Year:   . Arboriculturist in the Last Year:   Transportation Needs:   . Film/video editor (Medical):   Marland Kitchen Lack of Transportation (Non-Medical):   Physical Activity:   . Days of Exercise per Week:   . Minutes of Exercise per Session:   Stress:   . Feeling of Stress :   Social Connections:   . Frequency of Communication with Friends and Family:    . Frequency of Social Gatherings with Friends and Family:   . Attends Religious Services:   . Active Member of Clubs or Organizations:   . Attends Archivist Meetings:   Marland Kitchen Marital Status:     Family History  Problem Relation Age of Onset  . Healthy Mother   . Healthy Father   . Colon cancer Neg Hx     Outpatient Encounter Medications as of 03/12/2020  Medication Sig  . amLODipine (NORVASC) 5 MG tablet TAKE ONE (1) TABLET BY MOUTH EVERY DAY  . glipiZIDE (GLUCOTROL XL) 5 MG 24 hr tablet Take 5 mg by mouth daily with lunch.  . hydrochlorothiazide (HYDRODIURIL) 25 MG tablet Take 1 tablet (25 mg total) by mouth daily.  . indomethacin (INDOCIN SR) 75 MG CR capsule Take 75 mg by mouth 3 (three) times daily.  Marland Kitchen JANUVIA 50 MG tablet TAKE ONE (1) TABLET BY MOUTH EVERY DAY WITH LUNCH  . losartan (COZAAR) 100 MG tablet TAKE ONE TABLET ONCE DAILY  .  omeprazole (PRILOSEC) 40 MG capsule Take 40 mg by mouth daily.  . rosuvastatin (CRESTOR) 40 MG tablet Take 1 tablet (40 mg total) by mouth at bedtime.  . tamsulosin (FLOMAX) 0.4 MG CAPS capsule TAKE ONE CAPSULE BY MOUTH DAILY   No facility-administered encounter medications on file as of 03/12/2020.    ALLERGIES: Allergies  Allergen Reactions  . Penicillins     VACCINATION STATUS: Immunization History  Administered Date(s) Administered  . Influenza-Unspecified 07/26/2011, 08/10/2015    Diabetes He presents for his follow-up diabetic visit. He has type 2 diabetes mellitus. Onset time: He was diagnosed at approximate age of 30 years. His disease course has been improving. There are no hypoglycemic associated symptoms. Pertinent negatives for hypoglycemia include no confusion, headaches, pallor or seizures. Pertinent negatives for diabetes include no chest pain, no fatigue, no polydipsia, no polyphagia, no polyuria and no weakness. There are no hypoglycemic complications. Symptoms are improving. Diabetic complications include  nephropathy. Risk factors for coronary artery disease include diabetes mellitus, dyslipidemia, family history, male sex, obesity, hypertension and sedentary lifestyle. Current diabetic treatment includes oral agent (monotherapy). His weight is increasing steadily. He is following a generally unhealthy diet. When asked about meal planning, he reported none. He has not had a previous visit with a dietitian. He never participates in exercise. His home blood glucose trend is decreasing steadily. His breakfast blood glucose range is generally 110-130 mg/dl. His overall blood glucose range is 110-130 mg/dl. (He did not bring his in meter nor logs.  He reports his blood glucose readings range from 100-150, recent A1c was 6.5% overall improving from 8.6%.    ) An ACE inhibitor/angiotensin II receptor blocker is being taken. Eye exam is current.  Hyperlipidemia This is a chronic problem. The current episode started more than 1 year ago. The problem is uncontrolled. Exacerbating diseases include chronic renal disease, diabetes and obesity. Pertinent negatives include no chest pain, myalgias or shortness of breath. Current antihyperlipidemic treatment includes statins. Risk factors for coronary artery disease include diabetes mellitus, dyslipidemia, family history, obesity, male sex, hypertension and a sedentary lifestyle.  Hypertension This is a chronic problem. The current episode started more than 1 year ago. Pertinent negatives include no chest pain, headaches, neck pain, palpitations or shortness of breath. Risk factors for coronary artery disease include family history, dyslipidemia, obesity, male gender, sedentary lifestyle and diabetes mellitus. Past treatments include angiotensin blockers. Hypertensive end-organ damage includes kidney disease. Identifiable causes of hypertension include chronic renal disease.     Review of Systems  Constitutional: Negative for chills, fatigue, fever and unexpected weight  change.  HENT: Negative for dental problem, mouth sores and trouble swallowing.   Eyes: Negative for visual disturbance.  Respiratory: Negative for cough, choking, chest tightness, shortness of breath and wheezing.   Cardiovascular: Negative for chest pain, palpitations and leg swelling.  Gastrointestinal: Negative for abdominal distention, abdominal pain, constipation, diarrhea, nausea and vomiting.  Endocrine: Negative for polydipsia, polyphagia and polyuria.  Genitourinary: Negative for dysuria, flank pain, hematuria and urgency.  Musculoskeletal: Negative for back pain, gait problem, myalgias and neck pain.  Skin: Negative for pallor, rash and wound.  Neurological: Negative for seizures, syncope, weakness, numbness and headaches.  Psychiatric/Behavioral: Negative for confusion and dysphoric mood.    Objective:    BP (!) 166/86   Pulse 96   Ht 6\' 1"  (1.854 m)   Wt (!) 406 lb 9.6 oz (184.4 kg)   BMI 53.64 kg/m   Wt Readings from Last 3 Encounters:  03/12/20 (!) 406 lb 9.6 oz (184.4 kg)  11/13/19 (!) 381 lb 6.4 oz (173 kg)  11/06/19 (!) 368 lb (166.9 kg)     Physical Exam Constitutional:      General: He is not in acute distress.    Appearance: He is well-developed.  HENT:     Head: Normocephalic and atraumatic.  Neck:     Thyroid: No thyromegaly.     Trachea: No tracheal deviation.  Cardiovascular:     Rate and Rhythm: Normal rate.     Pulses:          Dorsalis pedis pulses are 1+ on the right side and 1+ on the left side.       Posterior tibial pulses are 1+ on the right side and 1+ on the left side.     Heart sounds: S1 normal and S2 normal. No murmur heard.  No gallop.   Pulmonary:     Effort: Pulmonary effort is normal. No respiratory distress.     Breath sounds: No wheezing.  Abdominal:     General: There is no distension.     Tenderness: There is no abdominal tenderness. There is no guarding.  Musculoskeletal:     Right shoulder: No swelling or deformity.      Cervical back: Normal range of motion and neck supple.  Skin:    General: Skin is warm and dry.     Findings: No rash.     Nails: There is no clubbing.  Neurological:     Mental Status: He is alert and oriented to person, place, and time.     Cranial Nerves: No cranial nerve deficit.     Sensory: No sensory deficit.     Gait: Gait normal.     Deep Tendon Reflexes: Reflexes are normal and symmetric.  Psychiatric:        Speech: Speech normal.        Behavior: Behavior normal. Behavior is cooperative.        Thought Content: Thought content normal.        Judgment: Judgment normal.     CMP ( most recent) CMP     Component Value Date/Time   NA 140 02/03/2020 0749   NA 138 12/04/2018 0000   K 4.7 02/03/2020 0749   CL 108 02/03/2020 0749   CO2 28 02/03/2020 0749   GLUCOSE 131 (H) 02/03/2020 0749   BUN 21 02/03/2020 0749   BUN 16 12/04/2018 0000   CREATININE 1.69 (H) 02/03/2020 0749   CALCIUM 8.9 02/03/2020 0749   PROT 6.8 02/03/2020 0749   ALBUMIN 3.8 12/04/2018 0000   AST 17 02/03/2020 0749   ALT 17 02/03/2020 0749   ALKPHOS 77 12/04/2018 0000   BILITOT 0.3 02/03/2020 0749   GFRNONAA 43 (L) 02/03/2020 0749   GFRAA 49 (L) 02/03/2020 0749     Diabetic Labs (most recent): Lab Results  Component Value Date   HGBA1C 6.5 (H) 02/03/2020   HGBA1C 7.3 (A) 11/13/2019   HGBA1C 8.6 (A) 07/16/2019       Assessment & Plan:   1. Uncontrolled type 2 diabetes mellitus with hyperglycemia ([facility]) - Robert [name] has currently uncontrolled symptomatic type 2 DM since  64 years of age.  He did not bring his in meter nor logs.  He reports his blood glucose readings range from 100-150, recent A1c was 6.5% overall improving from 8.6%.    - I had a long discussion with him about the progressive nature of diabetes  and the pathology behind its complications. -his diabetes is complicated by stage  2-3 renal insufficiency (improving), obesity/sedentary life and he remains at a high  risk for more acute and chronic complications which include CAD, CVA, CKD, retinopathy, and neuropathy. These are all discussed in detail with him.  - I have counseled him on diet  and weight management  by adopting a carbohydrate restricted/protein rich diet. Patient is encouraged to switch to  unprocessed or minimally processed     complex starch and increased protein intake (animal or plant source), fruits, and vegetables. -  he is advised to stick to a routine mealtimes to eat 3 meals  a day and avoid unnecessary snacks ( to snack only to correct hypoglycemia).   - he  admits there is a room for improvement in his diet and drink choices. -  Suggestion is made for him to avoid simple carbohydrates  from his diet including Cakes, Sweet Desserts / Pastries, Ice Cream, Soda (diet and regular), Sweet Tea, Candies, Chips, Cookies, Sweet Pastries,  Store Bought Juices, Alcohol in Excess of  1-2 drinks a day, Artificial Sweeteners, Coffee Creamer, and "Sugar-free" Products. This will help patient to have stable blood glucose profile and potentially avoid unintended weight gain.   - he has been scheduled with  Jearld Fenton, RDN, CDE for diabetes education.  - I have approached him with the following individualized plan to manage  his diabetes and patient agrees:   -Based on his presentation with improved A1c of 6.5%, he will not need insulin treatment for now.   - he is advised to continue glipizide 5 mg XL at breakfast.      - he will be considered for low-dose Metformin and improving renal function if necessary on subsequent visits.   He is not a candidate for SGLT2 inhibitors.  -He is advised to continue Januvia 50 mg p.o. daily at breakfast. -He is approached to start monitoring blood glucose at least at fasting. - he is encouraged to call clinic for blood glucose levels less than 70 or above 200 mg /dl.  - Specific targets for  A1c;  LDL, HDL, Triglycerides were discussed with the  patient.  2) Blood Pressure /Hypertension:  his blood pressure is not controlled to target, readings were higher on 2 prior visits as well.     he is advised to continue his current medications including losartan 100 mg p.o. daily at breakfast, I discussed and added hydrochlorothiazide 25 mg p.o. daily at breakfast.  -He will benefit from clonidine intervention instead of amlodipine.  Will discontinue amlodipine and added clonidine 0.1 mg p.o. twice daily.  3) Lipids/Hyperlipidemia:   Review of his recent lipid panel showed proving LDL at 109 from 210.  He has benefited from and advised to continue  Crestor 40 mg p.o. daily at bedtime.   Side effects and precautions discussed with him.  4)  Weight/Diet:  Body mass index is 53.64 kg/m.  -   clearly complicating his diabetes care.   he is  a candidate for major weight loss. I discussed with him the fact that loss of 5 - 10% of his  current body weight will have the most impact on his diabetes management.  Exercise, and detailed carbohydrates information provided  -  detailed on discharge instructions.  He will benefit the most from  weight loss, unlikely for him to achieve significant weight loss without surgical intervention.  He is a candidate for bariatric surgery, discussed in detail with  him.  He is open to consider this procedure, information brochure was provided to patient to call and initiate the process.    5) Chronic Care/Health Maintenance:  -he  is on ACEI/ARB and Statin medications and  is encouraged to initiate and continue to follow up with Ophthalmology, Dentist,  Podiatrist at least yearly or according to recommendations, and advised to   stay away from smoking. I have recommended yearly flu vaccine and pneumonia vaccine at least every 5 years; moderate intensity exercise for up to 150 minutes weekly; and  sleep for at least 7 hours a day.  - he is  advised to maintain close follow up with Doree Albee, MD for primary care needs,  as well as his other providers for optimal and coordinated care.  - Time spent on this patient care encounter:  35 min, of which > 50% was spent in  counseling and the rest reviewing his blood glucose logs , discussing his hypoglycemia and hyperglycemia episodes, reviewing his current and  previous labs / studies  ( including abstraction from other facilities) and medications  doses and developing a  long term treatment plan and documenting his care.   Please refer to Patient Instructions for Blood Glucose Monitoring and Insulin/Medications Dosing Guide"  in media tab for additional information. Please  also refer to " Patient Self Inventory" in the Media  tab for reviewed elements of pertinent patient history.  Robert [name] participated in the discussions, expressed understanding, and voiced agreement with the above plans.  All questions were answered to his satisfaction. he is encouraged to contact clinic should he have any questions or concerns prior to his return visit.   Follow up plan: - Return in about 4 months (around 07/12/2020) for Bring Meter and Logs- A1c in Office.  Glade Lloyd, MD [facility] [address] [place], [place] [address] Phone: [phone]  Fax: [phone]    03/12/2020, 9:08 AM  This note was partially dictated with voice recognition software. Similar sounding words can be transcribed inadequately or may not  be corrected upon review.

## 2020-03-12 NOTE — Patient Instructions (Signed)

## 2020-04-01 ENCOUNTER — Encounter (INDEPENDENT_AMBULATORY_CARE_PROVIDER_SITE_OTHER): Payer: Self-pay | Admitting: Internal Medicine

## 2020-04-01 ENCOUNTER — Ambulatory Visit (INDEPENDENT_AMBULATORY_CARE_PROVIDER_SITE_OTHER): Payer: 59 | Admitting: Internal Medicine

## 2020-04-01 ENCOUNTER — Other Ambulatory Visit: Payer: Self-pay

## 2020-04-01 VITALS — BP 140/90 | HR 97 | Temp 97.1°F | Ht 73.0 in | Wt >= 6400 oz

## 2020-04-01 DIAGNOSIS — I10 Essential (primary) hypertension: Secondary | ICD-10-CM

## 2020-04-01 DIAGNOSIS — E782 Mixed hyperlipidemia: Secondary | ICD-10-CM | POA: Diagnosis not present

## 2020-04-01 DIAGNOSIS — E1165 Type 2 diabetes mellitus with hyperglycemia: Secondary | ICD-10-CM

## 2020-04-01 MED ORDER — AMLODIPINE BESYLATE 10 MG PO TABS: 10.0000 mg | ORAL_TABLET | Freq: Every day | ORAL | 0 refills | Status: DC

## 2020-04-01 NOTE — Patient Instructions (Signed)
[name] Dietary Recommendations for Weight Loss What to Avoid . Avoid added sugars o Often added sugar can be found in processed foods such as many condiments, dry cereals, cakes, cookies, chips, crisps, crackers, candies, sweetened drinks, etc.  o Read labels and AVOID/DECREASE use of foods with the following in their ingredient list: Sugar, fructose, high fructose corn syrup, sucrose, glucose, maltose, dextrose, molasses, cane sugar, brown sugar, any type of syrup, agave nectar, etc.   . Avoid snacking in between meals . Avoid foods made with flour o If you are going to eat food made with flour, choose those made with whole-grains; and, minimize your consumption as much as is tolerable . Avoid processed foods o These foods are generally stocked in the middle of the grocery store. Focus on shopping on the perimeter of the grocery.  . Avoid Meat  o We recommend following a plant-based diet at [name]. Thus, we recommend avoiding meat as a general rule. Consider eating beans, legumes, eggs, and/or dairy products for regular protein sources o If you plan on eating meat limit to 4 ounces of meat at a time and choose lean options such as Fish, chicken, turkey. Avoid red meat intake such as pork and/or steak What to Include . Vegetables o GREEN LEAFY VEGETABLES: Kale, spinach, mustard greens, collard greens, cabbage, broccoli, etc. o OTHER: Asparagus, cauliflower, eggplant, carrots, peas, Brussel sprouts, tomatoes, bell peppers, zucchini, beets, cucumbers, etc. . Grains, seeds, and legumes o Beans: kidney beans, black eyed peas, garbanzo beans, black beans, pinto beans, etc. o Whole, unrefined grains: brown rice, barley, bulgur, oatmeal, etc. . Healthy fats  o Avoid highly processed fats such as vegetable oil o Examples of healthy fats: avocado, olives, virgin olive oil, dark chocolate (?72% Cocoa), nuts (peanuts, almonds, walnuts, cashews, pecans, etc.) . None to Low  Intake of Animal Sources of Protein o Meat sources: chicken, turkey, salmon, tuna. Limit to 4 ounces of meat at one time. o Consider limiting dairy sources, but when choosing dairy focus on: PLAIN Greek yogurt, cottage cheese, high-protein milk . Fruit o Choose berries  When to Eat . Intermittent Fasting: o Choosing not to eat for a specific time period, but DO FOCUS ON HYDRATION when fasting o Multiple Techniques: - Time Restricted Eating: eat 3 meals in a day, each meal lasting no more than 60 minutes, no snacks between meals - 16-18 hour fast: fast for 16 to 18 hours up to 7 days a week. Often suggested to start with 2-3 nonconsecutive days per week.  . Remember the time you sleep is counted as fasting.  . Examples of eating schedule: Fast from 7:00pm-11:00am. Eat between 11:00am-7:00pm.  - 24-hour fast: fast for 24 hours up to every other day. Often suggested to start with 1 day per week . Remember the time you sleep is counted as fasting . Examples of eating schedule:  o Eating day: eat 2-3 meals on your eating day. If doing 2 meals, each meal should last no more than 90 minutes. If doing 3 meals, each meal should last no more than 60 minutes. Finish last meal by 7:00pm. o Fasting day: Fast until 7:00pm.  o IF YOU FEEL UNWELL FOR ANY REASON/IN ANY WAY WHEN FASTING, STOP FASTING BY EATING A NUTRITIOUS SNACK OR LIGHT MEAL o ALWAYS FOCUS ON HYDRATION DURING FASTS - Acceptable Hydration sources: water, broths, tea/coffee (black tea/coffee is best but using a small amount of whole-fat dairy products in coffee/tea is acceptable).  -   Poor Hydration Sources: anything with sugar or artificial sweeteners added to it  These recommendations have been developed for patients that are actively receiving medical care from either Dr. Almond Fitzgibbon or Sarah Gray, DNP, NP-C at [facility]. These recommendations are developed for patients with specific medical conditions and are not meant to be  distributed or used by others that are not actively receiving care from either provider listed above at [facility]. It is not appropriate to participate in the above eating plans without proper medical supervision.   Reference: Fung, J. The obesity code. [place]: [place]; 2016.   

## 2020-04-01 NOTE — Progress Notes (Signed)
Metrics: Intervention Frequency ACO  Documented Smoking Status Yearly  Screened one or more times in 24 months  Cessation Counseling or  Active cessation medication Past 24 months  Past 24 months   Guideline developer: UpToDate (See UpToDate for funding source) Date Released: 2014       Wellness Office Visit  Subjective:  Patient ID: Robert [name], male    DOB: Aug 17, 1956  Age: 64 y.o. MRN: [id number]  CC: This 64 year old man comes to our practice as a new patient to establish care.  His previous physician, Dr. Sinda Du has now retired. HPI  His main problem is morbid obesity.  He weighs around 400 pounds.  He is frustrated and he needs to lose weight.  He wants to become healthier. He has diabetes, type II, his last hemoglobin A1c was 6.5%.  This was done just a month ago. He is also hypertensive and did not tolerate clonidine that was prescribed for him recently.  He is now back on amlodipine together with losartan. He also continues to take statin therapy with Crestor for his hyperlipidemia in the face of diabetes and morbid obesity.  Past Medical History:  Diagnosis Date  . Diabetes mellitus without complication ([place])   . GERD (gastroesophageal reflux disease)   . Headache   . Hypertension    Past Surgical History:  Procedure Laterality Date  . COLONOSCOPY N/A 03/17/2017   Procedure: COLONOSCOPY;  Surgeon: Daneil Dolin, MD;  [facility];  Service: Endoscopy;  Laterality: N/A;  10:30 AM  . NO PAST SURGERIES       Family History  Problem Relation Age of Onset  . Healthy Mother   . Healthy Father   . 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   Social History   Tobacco Use  . Smoking status: Never Smoker  . Smokeless tobacco: Never Used  Substance Use Topics  . Alcohol use: No    Current Meds  Medication Sig  . glipiZIDE (GLUCOTROL XL) 5 MG 24 hr  tablet Take 5 mg by mouth daily with lunch.  . hydrochlorothiazide (HYDRODIURIL) 25 MG tablet Take 1 tablet (25 mg total) by mouth daily.  . indomethacin (INDOCIN SR) 75 MG CR capsule Take 75 mg by mouth 3 (three) times daily.  Marland Kitchen JANUVIA 50 MG tablet TAKE ONE (1) TABLET BY MOUTH EVERY DAY WITH LUNCH  . losartan (COZAAR) 100 MG tablet TAKE ONE TABLET ONCE DAILY  . omeprazole (PRILOSEC) 40 MG capsule Take 40 mg by mouth daily.  . rosuvastatin (CRESTOR) 40 MG tablet Take 1 tablet (40 mg total) by mouth at bedtime.  . tamsulosin (FLOMAX) 0.4 MG CAPS capsule TAKE ONE CAPSULE BY MOUTH DAILY  . [DISCONTINUED] cloNIDine (CATAPRES) 0.1 MG tablet Take 1 tablet (0.1 mg total) by mouth 2 (two) times daily.       Depression screen [facility] 2/9 08/21/2019 08/21/2019 07/16/2019 03/18/2016  Decreased Interest 0 0 0 0  Down, Depressed, Hopeless 0 0 0 0  PHQ - 2 Score 0 0 0 0     Objective:   Today's Vitals: BP 140/90 (BP Location: Right Arm, Patient Position: Sitting, Cuff Size: Large)   Pulse 97   Temp (!) 97.1 F (36.2 C) (Temporal)   Ht 6\' 1"  (1.854 m)   Wt (!) 401 lb (181.9 kg)   SpO2 98%   BMI 52.91 kg/m  Vitals with BMI 04/01/2020 03/12/2020 11/13/2019  Height 6\' 1"  6\' 1"  6\' 1"   Weight 401 lbs 406 lbs 10 oz 381 lbs 6 oz  BMI 52.92 37.62 83.15  Systolic 176 160 737  Diastolic 90 86 96  Pulse 97 96 94     Physical Exam  He is morbidly obese.  Blood pressure is elevated.  He is alert and orientated without any focal neurologic signs.     Assessment   1. Uncontrolled type 2 diabetes mellitus with hyperglycemia ([place])   2. Essential hypertension, benign   3. Mixed hyperlipidemia   4. Morbid obesity ([place])       Tests ordered No orders of the defined types were placed in this encounter.    Plan: 1. I recommended that he increase amlodipine to 10 mg daily and discontinue clonidine which she did not tolerate as it gave him dizziness constantly.  He will continue with losartan  also. 2. I reviewed his blood work with him and his main issue is the morbid obesity.  He needs to lose weight.  We discussed the concept of intermittent fasting combined with more of a plant-based diet.  He is willing to try this.  I told him that he must not take his diabetic medications when he is fasting and can take them when he does eat.  If he can start fasting for 16 hours every day, ensuring that he is well-hydrated.  He already drinks about a gallon of water a day.  He probably could do a drinking slightly more than that, maybe 1-1/2 gallons of water a day. 3. As far as his diabetes is concerned, he will continue with glipizide and Januvia for the time being.  I did mention to him the glipizide will increase insulin levels and eventually we would like to discontinue this. 4. I will see him in a month for close follow-up to see how he is doing.   Meds ordered this encounter  Medications  . amLODipine (NORVASC) 10 MG tablet    Sig: Take 1 tablet (10 mg total) by mouth daily.    Dispense:  90 tablet    Refill:  0    Terryn Rosenkranz Luther Parody, MD

## 2020-04-20 ENCOUNTER — Other Ambulatory Visit: Payer: Self-pay | Admitting: Family Medicine

## 2020-04-23 ENCOUNTER — Other Ambulatory Visit (INDEPENDENT_AMBULATORY_CARE_PROVIDER_SITE_OTHER): Payer: Self-pay

## 2020-04-23 MED ORDER — GLIPIZIDE ER 5 MG PO TB24: 5.0000 mg | ORAL_TABLET | Freq: Every day | ORAL | 1 refills | Status: DC

## 2020-04-23 MED ORDER — SITAGLIPTIN PHOSPHATE 50 MG PO TABS: ORAL_TABLET | ORAL | 1 refills | Status: DC

## 2020-04-23 MED ORDER — LOSARTAN POTASSIUM 100 MG PO TABS: 100.0000 mg | ORAL_TABLET | Freq: Every day | ORAL | 1 refills | Status: DC

## 2020-04-28 ENCOUNTER — Ambulatory Visit (INDEPENDENT_AMBULATORY_CARE_PROVIDER_SITE_OTHER): Payer: 59 | Admitting: Internal Medicine

## 2020-04-28 ENCOUNTER — Encounter (INDEPENDENT_AMBULATORY_CARE_PROVIDER_SITE_OTHER): Payer: Self-pay | Admitting: Internal Medicine

## 2020-04-28 ENCOUNTER — Other Ambulatory Visit: Payer: Self-pay

## 2020-04-28 ENCOUNTER — Encounter: Payer: Self-pay | Admitting: Family Medicine

## 2020-04-28 VITALS — BP 120/90 | Ht 73.0 in | Wt 397.0 lb

## 2020-04-28 DIAGNOSIS — I10 Essential (primary) hypertension: Secondary | ICD-10-CM | POA: Diagnosis not present

## 2020-04-28 DIAGNOSIS — D649 Anemia, unspecified: Secondary | ICD-10-CM | POA: Diagnosis not present

## 2020-04-28 DIAGNOSIS — E1165 Type 2 diabetes mellitus with hyperglycemia: Secondary | ICD-10-CM | POA: Diagnosis not present

## 2020-04-28 NOTE — Progress Notes (Signed)
Metrics: Intervention Frequency ACO  Documented Smoking Status Yearly  Screened one or more times in 24 months  Cessation Counseling or  Active cessation medication Past 24 months  Past 24 months   Guideline developer: UpToDate (See UpToDate for funding source) Date Released: 2014       Wellness Office Visit  Subjective:  Patient ID: Robert [name], male    DOB: [birth year]  Age: 64 y.o. MRN: [id number]  CC: This man comes in for follow-up of diabetes, morbid obesity hypertension and anemia. HPI  He had blood work done by his previous physician and was found to have a hemoglobin of 13.0 which is decreased.  He denies any history of any GI bleeding. As far as his morbid obesity is concerned, he has been trying hard with intermittent fasting and has managed to lose weight. He continues on the same medications for his diabetes.  I had increased his amlodipine on the last visit and his blood pressure has improved. Past Medical History:  Diagnosis Date  . Diabetes mellitus without complication ([place])   . GERD (gastroesophageal reflux disease)   . Headache   . Hypertension    Past Surgical History:  Procedure Laterality Date  . COLONOSCOPY N/A 03/17/2017   Procedure: COLONOSCOPY;  Surgeon: Daneil Dolin, MD;  [facility];  Service: Endoscopy;  Laterality: N/A;  10:30 AM  . NO PAST SURGERIES       Family History  Problem Relation Age of Onset  . Healthy Mother   . Healthy Father   . 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   Social History   Tobacco Use  . Smoking status: Never Smoker  . Smokeless tobacco: Never Used  Substance Use Topics  . Alcohol use: No    Current Meds  Medication Sig  . amLODipine (NORVASC) 10 MG tablet Take 1 tablet (10 mg total) by mouth daily.  Marland Kitchen glipiZIDE (GLUCOTROL XL) 5 MG 24 hr tablet Take 1 tablet (5 mg total) by mouth daily with lunch.   . hydrochlorothiazide (HYDRODIURIL) 25 MG tablet Take 1 tablet (25 mg total) by mouth daily.  . indomethacin (INDOCIN SR) 75 MG CR capsule Take 75 mg by mouth 3 (three) times daily.  Marland Kitchen losartan (COZAAR) 100 MG tablet Take 1 tablet (100 mg total) by mouth daily.  Marland Kitchen omeprazole (PRILOSEC) 40 MG capsule Take 40 mg by mouth daily.  . rosuvastatin (CRESTOR) 40 MG tablet Take 1 tablet (40 mg total) by mouth at bedtime.  . sitaGLIPtin (JANUVIA) 50 MG tablet TAKE ONE (1) TABLET BY MOUTH EVERY DAY WITH LUNCH  . tamsulosin (FLOMAX) 0.4 MG CAPS capsule TAKE ONE CAPSULE BY MOUTH DAILY       Depression screen [facility] 2/9 08/21/2019 08/21/2019 07/16/2019 03/18/2016  Decreased Interest 0 0 0 0  Down, Depressed, Hopeless 0 0 0 0  PHQ - 2 Score 0 0 0 0     Objective:   Today's Vitals: There were no vitals taken for this visit. Vitals with BMI 04/28/2020 04/01/2020 03/12/2020  Height 6\' 1"  6\' 1"  6\' 1"   Weight 397 lbs 13 oz 401 lbs 406 lbs 10 oz  BMI 52.49 29.92 42.68  Systolic 341 962 229  Diastolic 90 90 86  Pulse 89 97 96     Physical Exam   He looks systemically well.  He has lost 4 pounds since  the last visit and his blood pressure also has improved.    Assessment   1. Uncontrolled type 2 diabetes mellitus with hyperglycemia ([place])   2. Essential hypertension, benign   3. Morbid obesity ([place])   4. Anemia, unspecified type       Tests ordered Orders Placed This Encounter  Procedures  . CBC  . COMPLETE METABOLIC PANEL WITH GFR  . Ferritin  . B12 and Folate Panel  . Iron and TIBC     Plan: 1. He will continue on present diabetic medications. 2. He will continue with the higher dose of amlodipine, losartan and hydrochlorthiazide.  Clonidine had been discontinued. 3. I will check B12, folate, iron studies for his anemia and we will check the hemoglobin again. 4. Further recommendations will depend on blood results and I will see him in 6 weeks time for follow-up.   No orders of  the defined types were placed in this encounter.   Doree Albee, MD

## 2020-04-29 LAB — COMPLETE METABOLIC PANEL WITH GFR
AG Ratio: 1.3 (calc) (ref 1.0–2.5)
ALT: 17 U/L (ref 9–46)
AST: 20 U/L (ref 10–35)
Albumin: 3.9 g/dL (ref 3.6–5.1)
Alkaline phosphatase (APISO): 73 U/L (ref 35–144)
BUN/Creatinine Ratio: 11 (calc) (ref 6–22)
BUN: 16 mg/dL (ref 7–25)
CO2: 24 mmol/L (ref 20–32)
Calcium: 9.1 mg/dL (ref 8.6–10.3)
Chloride: 106 mmol/L (ref 98–110)
Creat: 1.5 mg/dL — ABNORMAL HIGH (ref 0.70–1.25)
GFR, Est African American: 57 mL/min/{1.73_m2} — ABNORMAL LOW (ref >=60)
GFR, Est Non African American: 49 mL/min/{1.73_m2} — ABNORMAL LOW (ref >=60)
Globulin: 3.1 g/dL (calc) (ref 1.9–3.7)
Glucose, Bld: 117 mg/dL — ABNORMAL HIGH (ref 65–99)
Potassium: 4.5 mmol/L (ref 3.5–5.3)
Sodium: 137 mmol/L (ref 135–146)
Total Bilirubin: 0.5 mg/dL (ref 0.2–1.2)
Total Protein: 7 g/dL (ref 6.1–8.1)

## 2020-04-29 LAB — CBC
HCT: 41.2 % (ref 38.5–50.0)
Hemoglobin: 13.4 g/dL (ref 13.2–17.1)
MCH: 28.4 pg (ref 27.0–33.0)
MCHC: 32.5 g/dL (ref 32.0–36.0)
MCV: 87.3 fL (ref 80.0–100.0)
MPV: 11.5 fL (ref 7.5–12.5)
Platelets: 172 10*3/uL (ref 140–400)
RBC: 4.72 10*6/uL (ref 4.20–5.80)
RDW: 13.6 % (ref 11.0–15.0)
WBC: 7.1 10*3/uL (ref 3.8–10.8)

## 2020-04-29 LAB — B12 AND FOLATE PANEL
Folate: 12.4 ng/mL
Vitamin B-12: 279 pg/mL (ref 200–1100)

## 2020-04-29 LAB — IRON, TOTAL/TOTAL IRON BINDING CAP: Iron: 66 ug/dL (ref 50–180)

## 2020-04-29 LAB — FERRITIN: Ferritin: 215 ng/mL (ref 24–380)

## 2020-04-30 ENCOUNTER — Telehealth (INDEPENDENT_AMBULATORY_CARE_PROVIDER_SITE_OTHER): Payer: Self-pay

## 2020-04-30 MED ORDER — GLIPIZIDE ER 10 MG PO TB24: 10.0000 mg | ORAL_TABLET | Freq: Every day | ORAL | 2 refills | Status: DC

## 2020-04-30 NOTE — Telephone Encounter (Signed)
When the refill for Robert [name] for Glipizide went in to [facility] it went as 5mg  and they are calling stating that Robert [name] was taking 10mg  and patient states he was taking 10mg  please advise?

## 2020-06-02 ENCOUNTER — Other Ambulatory Visit (INDEPENDENT_AMBULATORY_CARE_PROVIDER_SITE_OTHER): Payer: Self-pay

## 2020-06-02 ENCOUNTER — Other Ambulatory Visit (INDEPENDENT_AMBULATORY_CARE_PROVIDER_SITE_OTHER): Payer: Self-pay | Admitting: Internal Medicine

## 2020-06-02 MED ORDER — AMLODIPINE BESYLATE 10 MG PO TABS: 10.0000 mg | ORAL_TABLET | Freq: Every day | ORAL | 1 refills | Status: AC

## 2020-06-02 MED ORDER — LOSARTAN POTASSIUM 100 MG PO TABS: 100.0000 mg | ORAL_TABLET | Freq: Every day | ORAL | 1 refills | Status: AC

## 2020-06-02 MED ORDER — OMEPRAZOLE 40 MG PO CPDR: 40.0000 mg | DELAYED_RELEASE_CAPSULE | Freq: Every day | ORAL | 1 refills | Status: AC

## 2020-06-02 MED ORDER — TAMSULOSIN HCL 0.4 MG PO CAPS: 0.4000 mg | ORAL_CAPSULE | Freq: Every day | ORAL | 1 refills | Status: AC

## 2020-06-10 ENCOUNTER — Ambulatory Visit (INDEPENDENT_AMBULATORY_CARE_PROVIDER_SITE_OTHER): Payer: 59 | Admitting: Internal Medicine

## 2020-07-01 ENCOUNTER — Other Ambulatory Visit: Payer: Self-pay

## 2020-07-01 MED ORDER — LINAGLIPTIN 5 MG PO TABS: 5.0000 mg | ORAL_TABLET | Freq: Every day | ORAL | 0 refills | Status: AC

## 2020-07-02 ENCOUNTER — Telehealth: Payer: Self-pay | Admitting: "Endocrinology

## 2020-07-02 NOTE — Telephone Encounter (Signed)
Patient is calling and states that he has always been on Januvia and Tradjenta was called in. Would like to know why this was changed.

## 2020-07-02 NOTE — Telephone Encounter (Signed)
Returned call to patient and advised it is to take the place of the [employer] not covering it, Patient wife verbalized understanding.

## 2020-07-13 ENCOUNTER — Ambulatory Visit: Payer: 59 | Admitting: Nurse Practitioner

## 2020-07-16 ENCOUNTER — Encounter (INDEPENDENT_AMBULATORY_CARE_PROVIDER_SITE_OTHER): Payer: Self-pay | Admitting: Internal Medicine

## 2020-07-16 ENCOUNTER — Other Ambulatory Visit: Payer: Self-pay

## 2020-07-16 ENCOUNTER — Ambulatory Visit (INDEPENDENT_AMBULATORY_CARE_PROVIDER_SITE_OTHER): Payer: 59 | Admitting: Internal Medicine

## 2020-07-16 VITALS — BP 137/80 | HR 88 | Temp 97.8°F | Resp 18 | Ht 74.0 in | Wt >= 6400 oz

## 2020-07-16 DIAGNOSIS — I10 Essential (primary) hypertension: Secondary | ICD-10-CM

## 2020-07-16 DIAGNOSIS — E1165 Type 2 diabetes mellitus with hyperglycemia: Secondary | ICD-10-CM | POA: Diagnosis not present

## 2020-07-16 MED ORDER — RYBELSUS 3 MG PO TABS: 3.0000 mg | ORAL_TABLET | Freq: Every day | ORAL | 3 refills | Status: AC

## 2020-07-16 NOTE — Progress Notes (Signed)
Metrics: Intervention Frequency ACO  Documented Smoking Status Yearly  Screened one or more times in 24 months  Cessation Counseling or  Active cessation medication Past 24 months  Past 24 months   Guideline developer: UpToDate (See UpToDate for funding source) Date Released: 2014       Wellness Office Visit  Subjective:  Patient ID: Robert [name], male    DOB: 1956-09-15  Age: 64 y.o. MRN: [id number]  CC: This man comes in for follow-up of morbid obesity, diabetes. HPI  Unfortunately, he has not been very consistent with nutrition and he realizes this. He continues on glipizide and Tradjenta for his diabetes. He does not wish to follow-up with endocrinology and would like to follow-up with me to help him with his obesity and diabetes. He continues on amlodipine for hypertension as well as losartan. Past Medical History:  Diagnosis Date  . Diabetes mellitus without complication ([place])   . GERD (gastroesophageal reflux disease)   . Headache   . Hypertension    Past Surgical History:  Procedure Laterality Date  . COLONOSCOPY N/A 03/17/2017   Procedure: COLONOSCOPY;  Surgeon: Daneil Dolin, MD;  [facility];  Service: Endoscopy;  Laterality: N/A;  10:30 AM  . NO PAST SURGERIES       Family History  Problem Relation Age of Onset  . Healthy Mother   . Healthy Father   . 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   Social History   Tobacco Use  . Smoking status: Never Smoker  . Smokeless tobacco: Never Used  Substance Use Topics  . Alcohol use: No    Current Meds  Medication Sig  . amLODipine (NORVASC) 10 MG tablet Take 1 tablet (10 mg total) by mouth daily.  Marland Kitchen glipiZIDE (GLUCOTROL XL) 10 MG 24 hr tablet Take 1 tablet (10 mg total) by mouth daily with lunch.  . hydrochlorothiazide (HYDRODIURIL) 25 MG tablet Take 1 tablet (25 mg total) by mouth daily.  .  indomethacin (INDOCIN SR) 75 MG CR capsule Take 75 mg by mouth 3 (three) times daily.  Marland Kitchen linagliptin (TRADJENTA) 5 MG TABS tablet Take 1 tablet (5 mg total) by mouth daily.  Marland Kitchen losartan (COZAAR) 100 MG tablet Take 1 tablet (100 mg total) by mouth daily.  Marland Kitchen omeprazole (PRILOSEC) 40 MG capsule Take 1 capsule (40 mg total) by mouth daily.  . rosuvastatin (CRESTOR) 40 MG tablet Take 1 tablet (40 mg total) by mouth at bedtime.  . tamsulosin (FLOMAX) 0.4 MG CAPS capsule Take 1 capsule (0.4 mg total) by mouth daily.      Depression screen [facility] 2/9 08/21/2019 08/21/2019 07/16/2019 03/18/2016  Decreased Interest 0 0 0 0  Down, Depressed, Hopeless 0 0 0 0  PHQ - 2 Score 0 0 0 0     Objective:   Today's Vitals: BP 137/80 (BP Location: Left Arm, Patient Position: Sitting, Cuff Size: Normal)   Pulse 88   Temp 97.8 F (36.6 C) (Temporal)   Resp 18   Ht 6\' 2"  (1.88 m)   Wt (!) 405 lb 11.2 oz (184 kg)   BMI 52.09 kg/m  Vitals with BMI 07/16/2020 04/28/2020 04/28/2020  Height 6\' 2"  6\' 1"  6\' 1"   Weight 405 lbs 11 oz 397 lbs 397 lbs 13 oz  BMI 52.07 40.98 11.91  Systolic 478 295 621  Diastolic 80 90 90  Pulse 88 - 89     Physical Exam  He remains morbidly obese and has gained back 8 pounds.  Blood pressure is well controlled.  He is alert and orientated without any obvious focal neurological signs.     Assessment   1. Essential hypertension, benign   2. Morbid obesity ([place])   3. Uncontrolled type 2 diabetes mellitus with hyperglycemia ([place])       Tests ordered No orders of the defined types were placed in this encounter.    Plan: 1. He will continue with the same antihypertensive medication listed above. 2. As far as his diabetes and morbid obesity are concerned, I am going to switch him from glipizide to Rybelsus and he will continue with Tradjenta.  Glipizide tends to increase insulin levels and this will be detrimental to him losing weight.  I hope that the insurance company  will approve Rybelsus.  I emphasized the importance of consistency with nutrition that we had discussed previously. 3. Follow-up in 2 months to see how he is doing and we will repeat blood work then.   Meds ordered this encounter  Medications  . Semaglutide (RYBELSUS) 3 MG TABS    Sig: Take 3 mg by mouth daily.    Dispense:  30 tablet    Refill:  3    [place] Madding Luther Parody, MD

## 2020-07-24 ENCOUNTER — Other Ambulatory Visit (INDEPENDENT_AMBULATORY_CARE_PROVIDER_SITE_OTHER): Payer: Self-pay | Admitting: Nurse Practitioner

## 2020-07-27 ENCOUNTER — Ambulatory Visit: Payer: 59 | Admitting: Nurse Practitioner

## 2020-08-30 ENCOUNTER — Inpatient Hospital Stay (HOSPITAL_COMMUNITY)
Admission: EM | Admit: 2020-08-30 | Discharge: 2020-10-03 | DRG: 208 | Disposition: E | Payer: 59 | Attending: Pulmonary Disease | Admitting: Pulmonary Disease

## 2020-08-30 ENCOUNTER — Encounter (HOSPITAL_COMMUNITY): Payer: Self-pay

## 2020-08-30 ENCOUNTER — Ambulatory Visit: Admission: EM | Admit: 2020-08-30 | Discharge: 2020-08-30 | Discharge status: Absent without leave | Payer: 59

## 2020-08-30 ENCOUNTER — Emergency Department (HOSPITAL_COMMUNITY): Payer: 59

## 2020-08-30 DIAGNOSIS — N179 Acute kidney failure, unspecified: Secondary | ICD-10-CM | POA: Diagnosis present

## 2020-08-30 DIAGNOSIS — T380X5A Adverse effect of glucocorticoids and synthetic analogues, initial encounter: Secondary | ICD-10-CM | POA: Diagnosis not present

## 2020-08-30 DIAGNOSIS — R0602 Shortness of breath: Secondary | ICD-10-CM

## 2020-08-30 DIAGNOSIS — Z7984 Long term (current) use of oral hypoglycemic drugs: Secondary | ICD-10-CM

## 2020-08-30 DIAGNOSIS — U071 COVID-19: Principal | ICD-10-CM | POA: Diagnosis present

## 2020-08-30 DIAGNOSIS — E785 Hyperlipidemia, unspecified: Secondary | ICD-10-CM | POA: Diagnosis not present

## 2020-08-30 DIAGNOSIS — Z978 Presence of other specified devices: Secondary | ICD-10-CM

## 2020-08-30 DIAGNOSIS — I2699 Other pulmonary embolism without acute cor pulmonale: Secondary | ICD-10-CM | POA: Diagnosis not present

## 2020-08-30 DIAGNOSIS — J45909 Unspecified asthma, uncomplicated: Secondary | ICD-10-CM | POA: Diagnosis present

## 2020-08-30 DIAGNOSIS — Z880 Allergy status to penicillin: Secondary | ICD-10-CM | POA: Diagnosis not present

## 2020-08-30 DIAGNOSIS — J9600 Acute respiratory failure, unspecified whether with hypoxia or hypercapnia: Secondary | ICD-10-CM

## 2020-08-30 DIAGNOSIS — Z79899 Other long term (current) drug therapy: Secondary | ICD-10-CM | POA: Diagnosis not present

## 2020-08-30 DIAGNOSIS — R6521 Severe sepsis with septic shock: Secondary | ICD-10-CM | POA: Diagnosis not present

## 2020-08-30 DIAGNOSIS — K567 Ileus, unspecified: Secondary | ICD-10-CM

## 2020-08-30 DIAGNOSIS — E875 Hyperkalemia: Secondary | ICD-10-CM | POA: Diagnosis not present

## 2020-08-30 DIAGNOSIS — A4189 Other specified sepsis: Secondary | ICD-10-CM | POA: Diagnosis not present

## 2020-08-30 DIAGNOSIS — N182 Chronic kidney disease, stage 2 (mild): Secondary | ICD-10-CM | POA: Diagnosis not present

## 2020-08-30 DIAGNOSIS — M25551 Pain in right hip: Secondary | ICD-10-CM

## 2020-08-30 DIAGNOSIS — E872 Acidosis: Secondary | ICD-10-CM | POA: Diagnosis present

## 2020-08-30 DIAGNOSIS — K219 Gastro-esophageal reflux disease without esophagitis: Secondary | ICD-10-CM | POA: Diagnosis present

## 2020-08-30 DIAGNOSIS — J1282 Pneumonia due to coronavirus disease 2019: Secondary | ICD-10-CM | POA: Diagnosis present

## 2020-08-30 DIAGNOSIS — E1165 Type 2 diabetes mellitus with hyperglycemia: Secondary | ICD-10-CM | POA: Diagnosis present

## 2020-08-30 DIAGNOSIS — N400 Enlarged prostate without lower urinary tract symptoms: Secondary | ICD-10-CM | POA: Diagnosis present

## 2020-08-30 DIAGNOSIS — Z01818 Encounter for other preprocedural examination: Secondary | ICD-10-CM

## 2020-08-30 DIAGNOSIS — Z6841 Body Mass Index (BMI) 40.0 and over, adult: Secondary | ICD-10-CM | POA: Diagnosis not present

## 2020-08-30 DIAGNOSIS — J9601 Acute respiratory failure with hypoxia: Secondary | ICD-10-CM | POA: Diagnosis present

## 2020-08-30 DIAGNOSIS — R7989 Other specified abnormal findings of blood chemistry: Secondary | ICD-10-CM | POA: Diagnosis not present

## 2020-08-30 DIAGNOSIS — J80 Acute respiratory distress syndrome: Secondary | ICD-10-CM | POA: Diagnosis present

## 2020-08-30 DIAGNOSIS — N1832 Chronic kidney disease, stage 3b: Secondary | ICD-10-CM | POA: Diagnosis present

## 2020-08-30 DIAGNOSIS — I10 Essential (primary) hypertension: Secondary | ICD-10-CM | POA: Diagnosis not present

## 2020-08-30 DIAGNOSIS — D6489 Other specified anemias: Secondary | ICD-10-CM | POA: Diagnosis present

## 2020-08-30 DIAGNOSIS — E1122 Type 2 diabetes mellitus with diabetic chronic kidney disease: Secondary | ICD-10-CM | POA: Diagnosis present

## 2020-08-30 DIAGNOSIS — R0902 Hypoxemia: Secondary | ICD-10-CM

## 2020-08-30 DIAGNOSIS — I129 Hypertensive chronic kidney disease with stage 1 through stage 4 chronic kidney disease, or unspecified chronic kidney disease: Secondary | ICD-10-CM | POA: Diagnosis present

## 2020-08-30 DIAGNOSIS — R578 Other shock: Secondary | ICD-10-CM | POA: Diagnosis not present

## 2020-08-30 DIAGNOSIS — J9690 Respiratory failure, unspecified, unspecified whether with hypoxia or hypercapnia: Secondary | ICD-10-CM

## 2020-08-30 LAB — CBC
HCT: 42.4 % (ref 39.0–52.0)
Hemoglobin: 13.9 g/dL (ref 13.0–17.0)
MCH: 28 pg (ref 26.0–34.0)
MCHC: 32.8 g/dL (ref 30.0–36.0)
MCV: 85.5 fL (ref 80.0–100.0)
Platelets: 168 10*3/uL (ref 150–400)
RBC: 4.96 MIL/uL (ref 4.22–5.81)
RDW: 13.2 % (ref 11.5–15.5)
WBC: 7.5 10*3/uL (ref 4.0–10.5)
nRBC: 0 % (ref 0.0–0.2)

## 2020-08-30 LAB — CBC WITH DIFFERENTIAL/PLATELET
Abs Immature Granulocytes: 0.04 10*3/uL (ref 0.00–0.07)
Basophils Absolute: 0 10*3/uL (ref 0.0–0.1)
Basophils Relative: 0 %
Eosinophils Absolute: 0 10*3/uL (ref 0.0–0.5)
Eosinophils Relative: 0 %
HCT: 44.4 % (ref 39.0–52.0)
Hemoglobin: 14.1 g/dL (ref 13.0–17.0)
Immature Granulocytes: 1 %
Lymphocytes Relative: 8 %
Lymphs Abs: 0.7 10*3/uL (ref 0.7–4.0)
MCH: 28.3 pg (ref 26.0–34.0)
MCHC: 31.8 g/dL (ref 30.0–36.0)
MCV: 89.2 fL (ref 80.0–100.0)
Monocytes Absolute: 0.3 10*3/uL (ref 0.1–1.0)
Monocytes Relative: 4 %
Neutro Abs: 7.4 10*3/uL (ref 1.7–7.7)
Neutrophils Relative %: 87 %
Platelets: 168 10*3/uL (ref 150–400)
RBC: 4.98 MIL/uL (ref 4.22–5.81)
RDW: 13.3 % (ref 11.5–15.5)
WBC: 8.5 10*3/uL (ref 4.0–10.5)
nRBC: 0 % (ref 0.0–0.2)

## 2020-08-30 LAB — BLOOD GAS, ARTERIAL
Acid-base deficit: 2 mmol/L (ref 0.0–2.0)
Bicarbonate: 23.1 mmol/L (ref 20.0–28.0)
FIO2: 100
O2 Saturation: 86 %
Patient temperature: 37.6
pCO2 arterial: 31.9 mmHg — ABNORMAL LOW (ref 32.0–48.0)
pH, Arterial: 7.444 (ref 7.350–7.450)
pO2, Arterial: 55.2 mmHg — ABNORMAL LOW (ref 83.0–108.0)

## 2020-08-30 LAB — COMPREHENSIVE METABOLIC PANEL
ALT: 26 U/L (ref 0–44)
AST: 43 U/L — ABNORMAL HIGH (ref 15–41)
Albumin: 3.5 g/dL (ref 3.5–5.0)
Alkaline Phosphatase: 72 U/L (ref 38–126)
Anion gap: 11 (ref 5–15)
BUN: 17 mg/dL (ref 8–23)
CO2: 23 mmol/L (ref 22–32)
Calcium: 8.4 mg/dL — ABNORMAL LOW (ref 8.9–10.3)
Chloride: 101 mmol/L (ref 98–111)
Creatinine, Ser: 1.94 mg/dL — ABNORMAL HIGH (ref 0.61–1.24)
GFR, Estimated: 38 mL/min — ABNORMAL LOW (ref >60)
Glucose, Bld: 256 mg/dL — ABNORMAL HIGH (ref 70–99)
Potassium: 4.5 mmol/L (ref 3.5–5.1)
Sodium: 135 mmol/L (ref 135–145)
Total Bilirubin: 0.8 mg/dL (ref 0.3–1.2)
Total Protein: 7.9 g/dL (ref 6.5–8.1)

## 2020-08-30 LAB — C-REACTIVE PROTEIN: CRP: 20.1 mg/dL — ABNORMAL HIGH (ref <1.0)

## 2020-08-30 LAB — CREATININE, SERUM
Creatinine, Ser: 1.99 mg/dL — ABNORMAL HIGH (ref 0.61–1.24)
GFR, Estimated: 37 mL/min — ABNORMAL LOW (ref >60)

## 2020-08-30 LAB — FIBRINOGEN: Fibrinogen: 717 mg/dL — ABNORMAL HIGH (ref 210–475)

## 2020-08-30 LAB — GLUCOSE, CAPILLARY
Glucose-Capillary: 334 mg/dL — ABNORMAL HIGH (ref 70–99)
Glucose-Capillary: 365 mg/dL — ABNORMAL HIGH (ref 70–99)
Glucose-Capillary: 396 mg/dL — ABNORMAL HIGH (ref 70–99)

## 2020-08-30 LAB — LACTIC ACID, PLASMA
Lactic Acid, Venous: 2.4 mmol/L (ref 0.5–1.9)
Lactic Acid, Venous: 1.7 mmol/L (ref 0.5–1.9)

## 2020-08-30 LAB — RESP PANEL BY RT-PCR (FLU A&B, COVID) ARPGX2
Influenza A by PCR: NEGATIVE
Influenza B by PCR: NEGATIVE
SARS Coronavirus 2 by RT PCR: POSITIVE — AB

## 2020-08-30 LAB — D-DIMER, QUANTITATIVE: D-Dimer, Quant: 1.18 ug/mL-FEU — ABNORMAL HIGH (ref 0.00–0.50)

## 2020-08-30 LAB — BRAIN NATRIURETIC PEPTIDE: B Natriuretic Peptide: 104 pg/mL — ABNORMAL HIGH (ref 0.0–100.0)

## 2020-08-30 LAB — MRSA PCR SCREENING: MRSA by PCR: NEGATIVE

## 2020-08-30 LAB — TRIGLYCERIDES: Triglycerides: 155 mg/dL — ABNORMAL HIGH (ref <150)

## 2020-08-30 LAB — PROCALCITONIN: Procalcitonin: <0.1 ng/mL

## 2020-08-30 LAB — LACTATE DEHYDROGENASE: LDH: 343 U/L — ABNORMAL HIGH (ref 98–192)

## 2020-08-30 LAB — FERRITIN: Ferritin: 613 ng/mL — ABNORMAL HIGH (ref 24–336)

## 2020-08-30 LAB — HEMOGLOBIN A1C
Hgb A1c MFr Bld: 7.9 % — ABNORMAL HIGH (ref 4.8–5.6)
Mean Plasma Glucose: 180.03 mg/dL

## 2020-08-30 MED ORDER — METHYLPREDNISOLONE SODIUM SUCC 125 MG IJ SOLR
80.0000 mg | Freq: Two times a day (BID) | INTRAMUSCULAR | Status: DC
  Administered 2020-08-30: 80 mg via INTRAVENOUS
  Filled 2020-08-30: qty 2

## 2020-08-30 MED ORDER — ROSUVASTATIN CALCIUM 20 MG PO TABS
40.0000 mg | ORAL_TABLET | Freq: Every day | ORAL | Status: DC
  Administered 2020-08-30 – 2020-09-12 (×14): 40 mg via ORAL
  Filled 2020-08-30 (×16): qty 2

## 2020-08-30 MED ORDER — INSULIN ASPART 100 UNIT/ML ~~LOC~~ SOLN: 6.0000 [IU] | Freq: Three times a day (TID) | SUBCUTANEOUS | Status: DC

## 2020-08-30 MED ORDER — BARICITINIB 1 MG PO TABS
4.0000 mg | ORAL_TABLET | Freq: Every day | ORAL | Status: DC
  Administered 2020-08-30 – 2020-09-01 (×3): 4 mg via ORAL
  Filled 2020-08-30 (×2): qty 4
  Filled 2020-08-30: qty 2

## 2020-08-30 MED ORDER — METHYLPREDNISOLONE SODIUM SUCC 125 MG IJ SOLR: 0.5000 mg/kg | Freq: Two times a day (BID) | INTRAMUSCULAR | Status: DC

## 2020-08-30 MED ORDER — SODIUM CHLORIDE 0.9 % IV SOLN: 100.0000 mg | Freq: Every day | INTRAVENOUS | Status: DC

## 2020-08-30 MED ORDER — LINAGLIPTIN 5 MG PO TABS
5.0000 mg | ORAL_TABLET | Freq: Every day | ORAL | Status: DC
  Filled 2020-08-30: qty 1

## 2020-08-30 MED ORDER — FUROSEMIDE 10 MG/ML IJ SOLN: 40.0000 mg | Freq: Once | INTRAMUSCULAR | Status: AC

## 2020-08-30 MED ORDER — AMLODIPINE BESYLATE 10 MG PO TABS
10.0000 mg | ORAL_TABLET | Freq: Every day | ORAL | Status: DC
  Administered 2020-08-30 – 2020-09-13 (×15): 10 mg via ORAL
  Filled 2020-08-30 (×15): qty 1

## 2020-08-30 MED ORDER — INSULIN ASPART 100 UNIT/ML ~~LOC~~ SOLN
0.0000 [IU] | Freq: Three times a day (TID) | SUBCUTANEOUS | Status: DC
  Administered 2020-08-31: 20 [IU] via SUBCUTANEOUS

## 2020-08-30 MED ORDER — ONDANSETRON HCL 4 MG/2ML IJ SOLN
4.0000 mg | Freq: Four times a day (QID) | INTRAMUSCULAR | Status: DC | PRN
  Filled 2020-08-30: qty 2

## 2020-08-30 MED ORDER — ROSUVASTATIN CALCIUM 5 MG PO TABS
40.0000 mg | ORAL_TABLET | Freq: Every day | ORAL | Status: DC
  Filled 2020-08-30: qty 8

## 2020-08-30 MED ORDER — FUROSEMIDE 10 MG/ML IJ SOLN
INTRAMUSCULAR | Status: AC
  Administered 2020-08-30: 40 mg via INTRAVENOUS
  Filled 2020-08-30: qty 4

## 2020-08-30 MED ORDER — SODIUM CHLORIDE 0.9 % IV SOLN
2.0000 g | Freq: Once | INTRAVENOUS | Status: AC
  Administered 2020-08-30: 2 g via INTRAVENOUS
  Filled 2020-08-30: qty 20

## 2020-08-30 MED ORDER — SODIUM CHLORIDE 0.9 % IV SOLN
100.0000 mg | Freq: Every day | INTRAVENOUS | Status: AC
  Administered 2020-08-31 – 2020-09-03 (×4): 100 mg via INTRAVENOUS
  Filled 2020-08-30 (×5): qty 20

## 2020-08-30 MED ORDER — ONDANSETRON HCL 4 MG PO TABS: 4.0000 mg | ORAL_TABLET | Freq: Four times a day (QID) | ORAL | Status: DC | PRN

## 2020-08-30 MED ORDER — DOCUSATE SODIUM 100 MG PO CAPS: 100.0000 mg | ORAL_CAPSULE | Freq: Two times a day (BID) | ORAL | Status: DC | PRN

## 2020-08-30 MED ORDER — SODIUM CHLORIDE 0.9 % IV SOLN
100.0000 mg | INTRAVENOUS | Status: AC
  Administered 2020-08-30 (×2): 100 mg via INTRAVENOUS
  Filled 2020-08-30: qty 20

## 2020-08-30 MED ORDER — ASCORBIC ACID 500 MG PO TABS
500.0000 mg | ORAL_TABLET | Freq: Every day | ORAL | Status: DC
  Administered 2020-08-31 – 2020-09-13 (×14): 500 mg via ORAL
  Filled 2020-08-30 (×14): qty 1

## 2020-08-30 MED ORDER — FAMOTIDINE 20 MG PO TABS
20.0000 mg | ORAL_TABLET | Freq: Two times a day (BID) | ORAL | Status: DC
  Administered 2020-08-30 – 2020-09-13 (×28): 20 mg via ORAL
  Filled 2020-08-30 (×28): qty 1

## 2020-08-30 MED ORDER — POLYETHYLENE GLYCOL 3350 17 G PO PACK
17.0000 g | PACK | Freq: Every day | ORAL | Status: DC | PRN
  Administered 2020-08-31 – 2020-09-08 (×4): 17 g via ORAL
  Filled 2020-08-30 (×4): qty 1

## 2020-08-30 MED ORDER — SODIUM CHLORIDE 0.9 % IV SOLN: INTRAVENOUS | Status: DC

## 2020-08-30 MED ORDER — SODIUM CHLORIDE 0.9 % IV SOLN
500.0000 mg | INTRAVENOUS | Status: DC
  Administered 2020-08-30: 500 mg via INTRAVENOUS
  Filled 2020-08-30 (×2): qty 500

## 2020-08-30 MED ORDER — TAMSULOSIN HCL 0.4 MG PO CAPS: 0.4000 mg | ORAL_CAPSULE | Freq: Every day | ORAL | Status: DC

## 2020-08-30 MED ORDER — INSULIN ASPART 100 UNIT/ML ~~LOC~~ SOLN: 0.0000 [IU] | Freq: Three times a day (TID) | SUBCUTANEOUS | Status: DC

## 2020-08-30 MED ORDER — INSULIN DETEMIR 100 UNIT/ML ~~LOC~~ SOLN
0.1000 [IU]/kg | Freq: Two times a day (BID) | SUBCUTANEOUS | Status: DC
  Administered 2020-08-30: 18 [IU] via SUBCUTANEOUS
  Filled 2020-08-30 (×2): qty 0.18

## 2020-08-30 MED ORDER — INSULIN ASPART 100 UNIT/ML ~~LOC~~ SOLN
0.0000 [IU] | Freq: Every day | SUBCUTANEOUS | Status: DC
  Administered 2020-08-30: 5 [IU] via SUBCUTANEOUS

## 2020-08-30 MED ORDER — ENOXAPARIN SODIUM 40 MG/0.4ML ~~LOC~~ SOLN: 40.0000 mg | SUBCUTANEOUS | Status: DC

## 2020-08-30 MED ORDER — SODIUM CHLORIDE 0.9 % IV SOLN: 200.0000 mg | Freq: Once | INTRAVENOUS | Status: DC

## 2020-08-30 MED ORDER — METHYLPREDNISOLONE SODIUM SUCC 125 MG IJ SOLR
0.5000 mg/kg | Freq: Two times a day (BID) | INTRAMUSCULAR | Status: AC
  Administered 2020-08-31 – 2020-09-02 (×6): 91.25 mg via INTRAVENOUS
  Filled 2020-08-30 (×6): qty 2

## 2020-08-30 MED ORDER — INSULIN ASPART 100 UNIT/ML ~~LOC~~ SOLN: 0.0000 [IU] | Freq: Every day | SUBCUTANEOUS | Status: DC

## 2020-08-30 MED ORDER — TAMSULOSIN HCL 0.4 MG PO CAPS
0.4000 mg | ORAL_CAPSULE | Freq: Every day | ORAL | Status: DC
  Administered 2020-08-30 – 2020-09-13 (×15): 0.4 mg via ORAL
  Filled 2020-08-30 (×14): qty 1

## 2020-08-30 MED ORDER — ENOXAPARIN SODIUM 40 MG/0.4ML ~~LOC~~ SOLN
40.0000 mg | SUBCUTANEOUS | Status: DC
  Filled 2020-08-30: qty 0.4

## 2020-08-30 MED ORDER — METHYLPREDNISOLONE SODIUM SUCC 125 MG IJ SOLR
125.0000 mg | Freq: Once | INTRAMUSCULAR | Status: AC
  Administered 2020-08-30: 125 mg via INTRAVENOUS
  Filled 2020-08-30: qty 2

## 2020-08-30 MED ORDER — ZINC SULFATE 220 (50 ZN) MG PO CAPS
220.0000 mg | ORAL_CAPSULE | Freq: Every day | ORAL | Status: DC
  Administered 2020-08-31 – 2020-09-13 (×14): 220 mg via ORAL
  Filled 2020-08-30 (×14): qty 1

## 2020-08-30 MED ORDER — AMLODIPINE BESYLATE 10 MG PO TABS: 10.0000 mg | ORAL_TABLET | Freq: Every day | ORAL | Status: DC

## 2020-08-30 MED ORDER — PREDNISONE 5 MG PO TABS
50.0000 mg | ORAL_TABLET | Freq: Every day | ORAL | Status: DC
  Administered 2020-09-03 – 2020-09-09 (×7): 50 mg via ORAL
  Filled 2020-08-30 (×7): qty 2
  Filled 2020-08-30: qty 1

## 2020-08-30 MED ORDER — FUROSEMIDE 10 MG/ML IJ SOLN
40.0000 mg | Freq: Once | INTRAMUSCULAR | Status: AC
  Administered 2020-08-30: 40 mg via INTRAVENOUS
  Filled 2020-08-30: qty 4

## 2020-08-30 MED ORDER — PREDNISONE 20 MG PO TABS: 50.0000 mg | ORAL_TABLET | Freq: Every day | ORAL | Status: DC

## 2020-08-30 NOTE — H&P (Signed)
NAME:  Robert [name], MRN:  [id number], DOB:  1956-09-26, LOS: 0 ADMISSION DATE:  08/31/2020, CONSULTATION DATE: 11/28 REFERRING MD:  Dr. Darrick Meigs, CHIEF COMPLAINT:  SOB  Brief History   64 y/o vaccinated male, who presented to [facility] on 11/28 with reports of shortness of breath. Found to be COVID  positive. Tx to [facility] for evaluation.   History of present illness   64 y/o vaccinated male, who presented to [facility] on 11/28 with reports of shortness of breath for 1 week.  As per patient he was in usual state of health until about 2week ago when he attended a funeral, after 1 week of that he started feeling difficulty breathing, he has been using albuterol nebulizer at home without relief. When he presented to the emergency department at any pain, his O2 sat was 67% on room air, he was placed on 50 L and 100% heated high flow nasal cannula with improvement in oxygen saturation to 85%. Patient denies headache, dizziness, chest pain, palpitation, abdominal pain, nausea, vomiting or diarrhea.  Past Medical History  HTN GERD  DM II  BMI 54.64 BPH  Significant Hospital Events   Transfer to ICU from any pain  Consults:    Procedures:    Significant Diagnostic Tests:  11/28 x-ray chest: Bilateral airspace disease  Micro Data:  11/28 influenza PCR negative 11/28 Covid PCR positive 11/28 blood cultures >> 11/28 MRSA PCR >>  Antimicrobials:  Azithromycin 11/28 >> Ceftriaxone 11/28 >> Baricitinib 11/28 >> Remdesivir 11/28 >>  Interim history/subjective:  Patient is treated with feeling little bit better since morning  Objective   Blood pressure (!) 167/98, pulse (!) 113, temperature 99.6 F (37.6 C), temperature source Oral, resp. rate (!) 40, height 6' (1.829 m), weight (!) 182.8 kg, SpO2 (!) 89 %.    FiO2 (%):  [100 %] 100 %   Intake/Output Summary (Last 24 hours) at 08/18/2020 1738 Last data filed at 08/23/2020 1611 Gross per 24 hour  Intake 450 ml  Output --  Net 450 ml    Filed Weights   08/22/2020 1029  Weight: (!) 182.8 kg    Examination:   Physical exam: General: Chronically ill-appearing morbidly obese male, on heated high flow and nonrebreather mask HEENT: [place]/AT, eyes anicteric.    Dry mucous membranes Neuro: Alert, awake, oriented x3, eyes open, following commands, moving all 4 extremities spontaneously Chest: Coarse breath sounds, no wheezes or rhonchi Heart: Regular rate and rhythm, no murmurs or gallops Abdomen: Soft, nontender, nondistended, bowel sounds present Skin: No rash   Resolved Hospital Problem list     Assessment & Plan:  Acute hypoxic respiratory failure due to ARDS from COVID-19 pneumonia AKI on CKD stage IIIb Diabetes type 2 with hyperglycemia due to steroid Hypertension Morbid obesity BPH  Continue heated high flow with a nonrebreather facemask, trial of BiPAP at night Continue Solu-Medrol 80 mg every 12 hours Continue remdesivir and baricitinib Patient received 40 mg of IV Lasix at [facility], will give him 40 mg IV Lasix x1 now, will reassess fluid status in the morning to see if he needs to be diuresed again O2 sat goal > 85% Patient baseline serum creatinine is around 1.5, he presented with serum creatinine of 1.9 Monitor closely Monitor intake and output Patient's hemoglobin A1c is 6.5, he is hyperglycemic due to steroid He takes oral hypoglycemic agents at home, will keep him on basal and bolus insulin for now Fingerstick goal 140-180 Resume amlodipine 10 mg once daily Holding  losartan and HCTZ Continue tamsulosin  Best practice (evaluated daily)   Diet: Diabetic diet Pain/Anxiety/Delirium protocol (if indicated): N/A VAP protocol (if indicated): N/A DVT prophylaxis: Subcu Lovenox GI prophylaxis: Famotidine Glucose control: Basal and bolus insulin Mobility: As tolerated last date of multidisciplinary goals of care discussion Family and staff present  Summary of discussion  Follow up goals of care  discussion due Code Status: Full code Disposition: ICU  Labs   CBC: Recent Labs  Lab 08/08/2020 1050  WBC 8.5  NEUTROABS 7.4  HGB 14.1  HCT 44.4  MCV 89.2  PLT 116    Basic Metabolic Panel: Recent Labs  Lab 08/10/2020 1050  NA 135  K 4.5  CL 101  CO2 23  GLUCOSE 256*  BUN 17  CREATININE 1.94*  CALCIUM 8.4*   GFR: Estimated Creatinine Clearance: 66.8 mL/min (A) (by C-G formula based on SCr of 1.94 mg/dL (H)). Recent Labs  Lab 08/29/2020 1050 08/20/2020 1238  PROCALCITON <0.10  --   WBC 8.5  --   LATICACIDVEN 2.4* 1.7    Liver Function Tests: Recent Labs  Lab 08/17/2020 1050  AST 43*  ALT 26  ALKPHOS 72  BILITOT 0.8  PROT 7.9  ALBUMIN 3.5   No results for input(s): LIPASE, AMYLASE in the last 168 hours. No results for input(s): AMMONIA in the last 168 hours.  ABG    Component Value Date/Time   PHART 7.444 08/09/2020 1105   PCO2ART 31.9 (L) 08/19/2020 1105   PO2ART 55.2 (L) 08/15/2020 1105   HCO3 23.1 08/19/2020 1105   ACIDBASEDEF 2.0 09/01/2020 1105   O2SAT 86.0 08/07/2020 1105     Coagulation Profile: No results for input(s): INR, PROTIME in the last 168 hours.  Cardiac Enzymes: No results for input(s): CKTOTAL, CKMB, CKMBINDEX, TROPONINI in the last 168 hours.  HbA1C: Hemoglobin A1C  Date/Time Value Ref Range Status  11/13/2019 08:55 AM 7.3 (A) 4.0 - 5.6 % Final  07/16/2019 08:56 AM 8.6 (A) 4.0 - 5.6 % Final  12/04/2018 12:00 AM 6.9  Final   Hgb A1c MFr Bld  Date/Time Value Ref Range Status  02/03/2020 07:49 AM 6.5 (H) <5.7 % of total Hgb Final    Comment:    For someone without known diabetes, a hemoglobin A1c value of 6.5% or greater indicates that they may have  diabetes and this should be confirmed with a follow-up  test. . For someone with known diabetes, a value <7% indicates  that their diabetes is well controlled and a value  greater than or equal to 7% indicates suboptimal  control. A1c targets should be individualized based on   duration of diabetes, age, comorbid conditions, and  other considerations. . Currently, no consensus exists regarding use of hemoglobin A1c for diagnosis of diabetes for children. .     CBG: No results for input(s): GLUCAP in the last 168 hours.  Review of Systems:   12 point review of system is significant for complaint mentioned in HPI, rest is negative  Past Medical History  He,  has a past medical history of Diabetes mellitus without complication ([place]), GERD (gastroesophageal reflux disease), Headache, and Hypertension.   Surgical History    Past Surgical History:  Procedure Laterality Date  . COLONOSCOPY N/A 03/17/2017   Procedure: COLONOSCOPY;  Surgeon: Daneil Dolin, MD;  [facility];  Service: Endoscopy;  Laterality: N/A;  10:30 AM  . NO PAST SURGERIES       Social History   reports that  he has never smoked. He has never used smokeless tobacco. He reports that he does not drink alcohol and does not use drugs.   Family History   His family history includes Healthy in his father and mother. There is no history of Colon cancer.   Allergies Allergies  Allergen Reactions  . Penicillins      Home Medications  Prior to Admission medications   Medication Sig Start Date End Date Taking? Authorizing Provider  amLODipine (NORVASC) 10 MG tablet Take 1 tablet (10 mg total) by mouth daily. 06/02/20  Yes Gosrani, Nimish C, MD  glipiZIDE (GLUCOTROL XL) 10 MG 24 hr tablet TAKE ONE TABLET (10MG  TOTAL) BY MOUTH DAILY WITH LUNCH Patient taking differently: Take 10 mg by mouth daily with breakfast.  07/27/20  Yes Gosrani, Nimish C, MD  hydrochlorothiazide (HYDRODIURIL) 25 MG tablet Take 1 tablet (25 mg total) by mouth daily. 07/16/19  Yes Nida, Marella Chimes, MD  indomethacin (INDOCIN SR) 75 MG CR capsule Take 75 mg by mouth 3 (three) times daily.   Yes [provider]  linagliptin (TRADJENTA) 5 MG TABS tablet Take 1 tablet (5 mg total) by mouth daily.  07/01/20  Yes Nida, Marella Chimes, MD  losartan (COZAAR) 100 MG tablet Take 1 tablet (100 mg total) by mouth daily. 06/02/20  Yes Gosrani, Nimish C, MD  omeprazole (PRILOSEC) 40 MG capsule Take 1 capsule (40 mg total) by mouth daily. 06/02/20  Yes Gosrani, Nimish C, MD  rosuvastatin (CRESTOR) 10 MG tablet TAKE ONE (1) TABLET BY MOUTH EVERY DAY Patient taking differently: Take 10 mg by mouth daily.  07/27/20  Yes Gosrani, Nimish C, MD  Semaglutide (RYBELSUS) 3 MG TABS Take 3 mg by mouth daily. 07/16/20  Yes Gosrani, Nimish C, MD  tamsulosin (FLOMAX) 0.4 MG CAPS capsule Take 1 capsule (0.4 mg total) by mouth daily. 06/02/20  Yes Gosrani, Nimish C, MD  rosuvastatin (CRESTOR) 40 MG tablet Take 1 tablet (40 mg total) by mouth at bedtime. Patient not taking: Reported on 08/26/2020 07/16/19   Cassandria Anger, MD     Total critical care time: 45 minutes  Performed by: [place] care time was exclusive of separately billable procedures and treating other patients.   Critical care was necessary to treat or prevent imminent or life-threatening deterioration.   Critical care was time spent personally by me on the following activities: development of treatment plan with patient and/or surrogate as well as nursing, discussions with consultants, evaluation of patient's response to treatment, examination of patient, obtaining history from patient or surrogate, ordering and performing treatments and interventions, ordering and review of laboratory studies, ordering and review of radiographic studies, pulse oximetry and re-evaluation of patient's condition.   Jacky Kindle MD [facility] Pager: [phone] Mobile: [phone]

## 2020-08-30 NOTE — ED Triage Notes (Signed)
Pt reports that he went to a funeral a week ago and has been sick since. Body aches, dizzy, feels confused at times. Cough, sob dry mouth. sats 67% on room air O2 initiated and RT called

## 2020-08-30 NOTE — ED Provider Notes (Signed)
I spoke with Dr. Valeta Harms critical care and he stated the patient can be transferred to [facility] under his care to the ICU.  He stated as long as his O2 stats stay 85% then we can hold off on intubation.   Milton Ferguson, MD 08/27/2020 1454

## 2020-08-30 NOTE — ED Notes (Signed)
Spoke with Abbe Amsterdam at [facility] will page critical care for MD.

## 2020-08-30 NOTE — ED Notes (Signed)
Respiratory & Tammy, PA notified of pts sats.

## 2020-08-30 NOTE — Consult Note (Addendum)
Patient Demographics:    Robert [name], is a 65 y.o. male  MRN: [id number]  DOB - April 15, 1956  Admit Date - 08/03/2020  Referring MD/NP/PA: Dr. Roderic Palau  Outpatient Primary MD for the patient is Doree Albee, MD  Patient coming from: Home  Chief complaint-shortness of breath   HPI:    Robert [name]  is a 64 y.o. male, with past medical history of diabetes mellitus type 2, hypertension, morbid obesity who came to ED with complaints of generalized body aches, dizziness upon standing, cough, shortness of breath. Patient said the symptoms have been there for 1 week and has been gradually getting worse. Patient has history of asthma and has been using albuterol nebulizer at home without relief. He attended a funeral few weeks ago and has been feeling sick since then. On arrival to ED his O2 sats was  67% on room air  When I saw patient he was on 50 L oxygen 100% heated high flow nasal cannula, with O2 sats hovering around 82 to 85%. He denies nausea vomiting or diarrhea. Denies chest pain. Denies abdominal pain. Patient states that he was vaccinated with COVID-19 vaccine and received two doses few months back. Chest x-ray showed bilateral infiltrates, and patient was started on ceftriaxone and Zithromax. He also received one dose of Solu-Medrol 125 mg IV.  Patient found to have positive SARS-2 RT-PCR.    Review of systems:    In addition to the HPI above,    All other systems reviewed and are negative.    Past History of the following :    Past Medical History:  Diagnosis Date  . Diabetes mellitus without complication ([place])   . GERD (gastroesophageal reflux disease)   . Headache   . Hypertension       Past Surgical History:  Procedure Laterality Date  . COLONOSCOPY N/A 03/17/2017   Procedure: COLONOSCOPY;  Surgeon: Daneil Dolin, MD;  [facility];  Service: Endoscopy;  Laterality: N/A;  10:30  AM  . NO PAST SURGERIES        Social History:      Social History   Tobacco Use  . Smoking status: Never Smoker  . Smokeless tobacco: Never Used  Substance Use Topics  . Alcohol use: No       Family History :     Family History  Problem Relation Age of Onset  . Healthy Mother   . Healthy Father   . Colon cancer Neg Hx       Home Medications:   Prior to Admission medications   Medication Sig Start Date End Date Taking? Authorizing Provider  amLODipine (NORVASC) 10 MG tablet Take 1 tablet (10 mg total) by mouth daily. 06/02/20  Yes Gosrani, Nimish C, MD  glipiZIDE (GLUCOTROL XL) 10 MG 24 hr tablet TAKE ONE TABLET (10MG  TOTAL) BY MOUTH DAILY WITH LUNCH Patient taking differently: Take 10 mg by mouth daily with breakfast.  07/27/20  Yes Gosrani, Nimish C, MD  hydrochlorothiazide (HYDRODIURIL) 25 MG tablet Take 1 tablet (25 mg total) by mouth daily. 07/16/19  Yes Nida, Marella Chimes, MD  indomethacin (  INDOCIN SR) 75 MG CR capsule Take 75 mg by mouth 3 (three) times daily.   Yes [provider]  linagliptin (TRADJENTA) 5 MG TABS tablet Take 1 tablet (5 mg total) by mouth daily. 07/01/20  Yes Nida, Marella Chimes, MD  losartan (COZAAR) 100 MG tablet Take 1 tablet (100 mg total) by mouth daily. 06/02/20  Yes Gosrani, Nimish C, MD  omeprazole (PRILOSEC) 40 MG capsule Take 1 capsule (40 mg total) by mouth daily. 06/02/20  Yes Gosrani, Nimish C, MD  rosuvastatin (CRESTOR) 10 MG tablet TAKE ONE (1) TABLET BY MOUTH EVERY DAY Patient taking differently: Take 10 mg by mouth daily.  07/27/20  Yes Gosrani, Nimish C, MD  Semaglutide (RYBELSUS) 3 MG TABS Take 3 mg by mouth daily. 07/16/20  Yes Gosrani, Nimish C, MD  tamsulosin (FLOMAX) 0.4 MG CAPS capsule Take 1 capsule (0.4 mg total) by mouth daily. 06/02/20  Yes Gosrani, Nimish C, MD  rosuvastatin (CRESTOR) 40 MG tablet Take 1 tablet (40 mg total) by mouth at bedtime. Patient not taking: Reported on 08/29/2020 07/16/19   Cassandria Anger, MD     Allergies:     Allergies  Allergen Reactions  . Penicillins      Physical Exam:   Vitals  Blood pressure (!) 145/76, pulse (!) 120, temperature 99.6 F (37.6 C), temperature source Oral, resp. rate (!) 39, height 6' (1.829 m), weight (!) 182.8 kg, SpO2 (!) 84 %.  1.  General: Appears in mild respiratory distress  2. Psychiatric: Alert, oriented x4, intact insight and judgment  3. Neurologic: Cranial two through grossly intact, no focal deficit noted  4. HEENMT:  Atraumatic normocephalic, extraocular muscles are intact  5. Respiratory : Decreased breath sounds bilaterally  6. Cardiovascular : S1-S2, regular, no murmur auscultated, no edema in the lower extremities noted  7. Gastrointestinal:  Abdomen is soft, nontender, no organomegaly     Data Review:    CBC Recent Labs  Lab 08/10/2020 1050  WBC 8.5  HGB 14.1  HCT 44.4  PLT 168  MCV 89.2  MCH 28.3  MCHC 31.8  RDW 13.3  LYMPHSABS 0.7  MONOABS 0.3  EOSABS 0.0  BASOSABS 0.0   ------------------------------------------------------------------------------------------------------------------  Results for orders placed or performed during the hospital encounter of 08/12/2020 (from the past 48 hour(s))  Resp Panel by RT-PCR (Flu A&B, Covid) Nasopharyngeal Swab     Status: Abnormal   Collection Time: 08/20/2020 10:41 AM   Specimen: Nasopharyngeal Swab; Nasopharyngeal(NP) swabs in vial transport medium  Result Value Ref Range   SARS Coronavirus 2 by RT PCR POSITIVE (A) NEGATIVE    Comment: RESULT CALLED TO, READ BACK BY AND VERIFIED WITH: GANNT E. @ [id number] ON [id number] BY HENDERSON L. (NOTE) SARS-CoV-2 target nucleic acids are DETECTED.  The SARS-CoV-2 RNA is generally detectable in upper respiratory specimens during the acute phase of infection. Positive results are indicative of the presence of the identified virus, but do not rule out bacterial infection or co-infection with other  pathogens not detected by the test. Clinical correlation with patient history and other diagnostic information is necessary to determine patient infection status. The expected result is Negative.  Fact Sheet for Patients: EntrepreneurPulse.com.au  Fact Sheet for Healthcare Providers: IncredibleEmployment.be  This test is not yet approved or cleared by the [place] FDA and  has been authorized for detection and/or diagnosis of SARS-CoV-2 by FDA under an Emergency Use Authorization (EUA).  This EUA will remain in effect (meaning this tes t  can be used) for the duration of  the COVID-19 declaration under Section 564(b)(1) of the Act, 21 U.S.C. section 360bbb-3(b)(1), unless the authorization is terminated or revoked sooner.     Influenza A by PCR NEGATIVE NEGATIVE   Influenza B by PCR NEGATIVE NEGATIVE    Comment: (NOTE) The Xpert Xpress SARS-CoV-2/FLU/RSV plus assay is intended as an aid in the diagnosis of influenza from Nasopharyngeal swab specimens and should not be used as a sole basis for treatment. Nasal washings and aspirates are unacceptable for Xpert Xpress SARS-CoV-2/FLU/RSV testing.  Fact Sheet for Patients: EntrepreneurPulse.com.au  Fact Sheet for Healthcare Providers: IncredibleEmployment.be  This test is not yet approved or cleared by the [place] FDA and has been authorized for detection and/or diagnosis of SARS-CoV-2 by FDA under an Emergency Use Authorization (EUA). This EUA will remain in effect (meaning this test can be used) for the duration of the COVID-19 declaration under Section 564(b)(1) of the Act, 21 U.S.C. section 360bbb-3(b)(1), unless the authorization is terminated or revoked.  Performed at [facility], [address], [place], [place] [address]   Lactic acid, plasma     Status: Abnormal   Collection Time: 08/17/2020 10:50 AM  Result Value Ref Range   Lactic Acid,  Venous 2.4 (HH) 0.5 - 1.9 mmol/L    Comment: CRITICAL RESULT CALLED TO, READ BACK BY AND VERIFIED WITH: ANNE TUTTLE,RN @1201  08/24/2020 KAY Performed at [facility], [address], [place], [place] [address]   CBC WITH DIFFERENTIAL     Status: None   Collection Time: 08/06/2020 10:50 AM  Result Value Ref Range   WBC 8.5 4.0 - 10.5 K/uL   RBC 4.98 4.22 - 5.81 MIL/uL   Hemoglobin 14.1 13.0 - 17.0 g/dL   HCT 44.4 39 - 52 %   MCV 89.2 80.0 - 100.0 fL   MCH 28.3 26.0 - 34.0 pg   MCHC 31.8 30.0 - 36.0 g/dL   RDW 13.3 11.5 - 15.5 %   Platelets 168 150 - 400 K/uL   nRBC 0.0 0.0 - 0.2 %   Neutrophils Relative % 87 %   Neutro Abs 7.4 1.7 - 7.7 K/uL   Lymphocytes Relative 8 %   Lymphs Abs 0.7 0.7 - 4.0 K/uL   Monocytes Relative 4 %   Monocytes Absolute 0.3 0.1 - 1.0 K/uL   Eosinophils Relative 0 %   Eosinophils Absolute 0.0 0.0 - 0.5 K/uL   Basophils Relative 0 %   Basophils Absolute 0.0 0.0 - 0.1 K/uL   Immature Granulocytes 1 %   Abs Immature Granulocytes 0.04 0.00 - 0.07 K/uL    Comment: Performed at [facility], [address], [place], [place] [address]  Comprehensive metabolic panel     Status: Abnormal   Collection Time: 08/19/2020 10:50 AM  Result Value Ref Range   Sodium 135 135 - 145 mmol/L   Potassium 4.5 3.5 - 5.1 mmol/L   Chloride 101 98 - 111 mmol/L   CO2 23 22 - 32 mmol/L   Glucose, Bld 256 (H) 70 - 99 mg/dL    Comment: Glucose reference range applies only to samples taken after fasting for at least 8 hours.   BUN 17 8 - 23 mg/dL   Creatinine, Ser 1.94 (H) 0.61 - 1.24 mg/dL   Calcium 8.4 (L) 8.9 - 10.3 mg/dL   Total Protein 7.9 6.5 - 8.1 g/dL   Albumin 3.5 3.5 - 5.0 g/dL   AST 43 (H) 15 - 41 U/L  ALT 26 0 - 44 U/L   Alkaline Phosphatase 72 38 - 126 U/L   Total Bilirubin 0.8 0.3 - 1.2 mg/dL   GFR, Estimated 38 (L) >60 mL/min    Comment: (NOTE) Calculated using the CKD-EPI Creatinine Equation (2021)    Anion gap 11 5 - 15    Comment: Performed at [facility], [address], [place], [place] [address]  D-dimer, quantitative     Status: Abnormal   Collection Time: 08/03/2020 10:50 AM  Result Value Ref Range   D-Dimer, Quant 1.18 (H) 0.00 - 0.50 ug/mL-FEU    Comment: (NOTE) At the manufacturer cut-off value of 0.5 g/mL FEU, this assay has a negative predictive value of 95-100%.This assay is intended for use in conjunction with a clinical pretest probability (PTP) assessment model to exclude pulmonary embolism (PE) and deep venous thrombosis (DVT) in outpatients suspected of PE or DVT. Results should be correlated with clinical presentation. Performed at [facility], [address], [place], [place] [address]   Procalcitonin     Status: None   Collection Time: 08/16/2020 10:50 AM  Result Value Ref Range   Procalcitonin <0.10 ng/mL    Comment:        Interpretation: PCT (Procalcitonin) <= 0.5 ng/mL: Systemic infection (sepsis) is not likely. Local bacterial infection is possible. (NOTE)       Sepsis PCT Algorithm           Lower Respiratory Tract                                      Infection PCT Algorithm    ----------------------------     ----------------------------         PCT < 0.25 ng/mL                PCT < 0.10 ng/mL          Strongly encourage             Strongly discourage   discontinuation of antibiotics    initiation of antibiotics    ----------------------------     -----------------------------       PCT 0.25 - 0.50 ng/mL            PCT 0.10 - 0.25 ng/mL               OR       >80% decrease in PCT            Discourage initiation of                                            antibiotics      Encourage discontinuation           of antibiotics    ----------------------------     -----------------------------         PCT >= 0.50 ng/mL              PCT 0.26 - 0.50 ng/mL               AND        <80% decrease in PCT             Encourage initiation of  antibiotics       Encourage  continuation           of antibiotics    ----------------------------     -----------------------------        PCT >= 0.50 ng/mL                  PCT > 0.50 ng/mL               AND         increase in PCT                  Strongly encourage                                      initiation of antibiotics    Strongly encourage escalation           of antibiotics                                     -----------------------------                                           PCT <= 0.25 ng/mL                                                 OR                                        > 80% decrease in PCT                                      Discontinue / Do not initiate                                             antibiotics  Performed at [facility], [address] [place], [place] [address]   Lactate dehydrogenase     Status: Abnormal   Collection Time: 08/10/2020 10:50 AM  Result Value Ref Range   LDH 343 (H) 98 - 192 U/L    Comment: Performed at [facility], [address] [place], [place] [address]  Ferritin     Status: Abnormal   Collection Time: 08/12/2020 10:50 AM  Result Value Ref Range   Ferritin 613 (H) 24 - 336 ng/mL    Comment: Performed at [facility], [address], [place], [place] [address]  Triglycerides     Status: Abnormal   Collection Time: 08/18/2020 10:50 AM  Result Value Ref Range   Triglycerides 155 (H) <150 mg/dL    Comment: Performed at [facility], [address] [place], [place] [address]  Fibrinogen     Status: Abnormal   Collection Time: 08/05/2020 10:50 AM  Result Value Ref Range   Fibrinogen 717 (H) 210 -  475 mg/dL    Comment: Performed at [facility], [address], [place], [place] [address]  C-reactive protein     Status: Abnormal   Collection Time: 08/06/2020 10:50 AM  Result Value Ref Range   CRP 20.1 (H) <1.0 mg/dL    Comment: Performed at [facility], [address] [place], [place] [address]  Brain natriuretic peptide     Status: Abnormal    Collection Time: 08/06/2020 10:54 AM  Result Value Ref Range   B Natriuretic Peptide 104.0 (H) 0.0 - 100.0 pg/mL    Comment: Performed at [facility], [address], [place], [place] [address]  Blood gas, arterial     Status: Abnormal   Collection Time: 08/19/2020 11:05 AM  Result Value Ref Range   FIO2 100.00    pH, Arterial 7.444 7.35 - 7.45   pCO2 arterial 31.9 (L) 32 - 48 mmHg   pO2, Arterial 55.2 (L) 83 - 108 mmHg   Bicarbonate 23.1 20.0 - 28.0 mmol/L   Acid-base deficit 2.0 0.0 - 2.0 mmol/L   O2 Saturation 86.0 %   Patient temperature 37.6    Allens test (pass/fail) PASS PASS    Comment: Performed at [facility], [address], [place], [place] [address]  Lactic acid, plasma     Status: None   Collection Time: 08/12/2020 12:38 PM  Result Value Ref Range   Lactic Acid, Venous 1.7 0.5 - 1.9 mmol/L    Comment: Performed at [facility], [address] [place], [place] [address]    Chemistries  Recent Labs  Lab 08/03/2020 1050  NA 135  K 4.5  CL 101  CO2 23  GLUCOSE 256*  BUN 17  CREATININE 1.94*  CALCIUM 8.4*  AST 43*  ALT 26  ALKPHOS 72  BILITOT 0.8   ------------------------------------------------------------------------------------------------------------------  ------------------------------------------------------------------------------------------------------------------ GFR: Estimated Creatinine Clearance: 66.8 mL/min (A) (by C-G formula based on SCr of 1.94 mg/dL (H)). Liver Function Tests: Recent Labs  Lab 08/06/2020 1050  AST 43*  ALT 26  ALKPHOS 72  BILITOT 0.8  PROT 7.9  ALBUMIN 3.5   Lipid Profile: Recent Labs    08/10/2020 1050  TRIG 155*   Thyroid Function Tests: No results for input(s): TSH, T4TOTAL, FREET4, T3FREE, THYROIDAB in the last 72 hours. Anemia Panel: Recent Labs    08/14/2020 1050  FERRITIN 613*    --------------------------------------------------------------------------------------------------------------- Urine  analysis:    Component Value Date/Time   BILIRUBINUR negative 08/21/2019 1213   KETONESUR negative 08/21/2019 1213   UROBILINOGEN 0.2 08/21/2019 1213   NITRITE Negative 08/21/2019 1213   LEUKOCYTESUR Negative 08/21/2019 1213      Imaging Results:    DG Chest Port 1 View  Result Date: 08/28/2020 CLINICAL DATA:  Shortness of breath. EXAM: PORTABLE CHEST 1 VIEW COMPARISON:  None. FINDINGS: Cardiomediastinal silhouette is accentuated by low lung volumes and AP portable technique. Low lung volumes with bilateral peripheral predominant airspace opacities. No visible pleural effusions or pneumothorax. No acute osseous abnormality. IMPRESSION: Low lung volumes with bilateral peripheral predominant airspace opacities, concerning for multifocal pneumonia. Recommend correlation with COVID testing. Electronically Signed   By: Margaretha Sheffield MD   On: 08/29/2020 11:59    My personal review of EKG: Rhythm NSR, no ST changes   Assessment & Plan:    Active Problems:   Pneumonia due to COVID-19 virus   Acute hypoxemic respiratory failure ([place])   1. Acute hypoxemic respiratory failure-secondary to COVID-19 pneumonia, patient is currently requiring 50 L 100% oxygen HFNC.  Labs showed CRP 20.1, lactic acid 2.4, procalcitonin less than 0.10, triglyceride 155, ferritin 613, BNP 104.0.  We will start remdesivir per pharmacy consultation, baricitinib 4 mg daily, will also give one dose of Lasix 40 mg IV x1.  This patient is at high risk for getting intubated considering high oxygen need.  He will also need round-the-clock critical care services.  I recommend this patient to be transferred to [facility] critical care.  I called and discussed with critical care physician Dr. Valeta Harms, who was accepted the patient at [facility].  Discussed above  with ED physician Dr. Roderic Palau who agrees with the plan.    Code Status:  Full code    Oswald Hillock M.D

## 2020-08-30 NOTE — ED Notes (Signed)
Critical from lab  Lactic acid 2.4  PA and RN informed

## 2020-08-30 NOTE — ED Provider Notes (Signed)
[facility] EMERGENCY DEPARTMENT Provider Note   CSN: [id number] Arrival date & time: 08/13/2020  8295     History Chief Complaint  Patient presents with  . hxpoxia    Robert [name] is a 64 y.o. male.  HPI      Robert [name] is a 64 y.o. male with past medical history of type 2 diabetes, hypertension, and obesity who presents to the Emergency Department complaining of generalized body aches, dizziness upon standing, intermittent confusion, cough, shortness of breath, and diarrhea.  Symptoms have been present for 1 week.  Gradually worsening shortness of breath.  Robert [name] endorses history of asthma and states that Robert [name] has been using albuterol nebulizer at home without relief.  Robert [name] attended a funeral a week ago and has been feeling ill ever since.  On arrival Robert [name] was found to have O2 sat of 67% on room air.  Robert [name] was placed on 4 L oxygen by nasal cannula with no improvement.  Respiratory was contacted and patient placed on high flow oxygen at 15 L sats in the mid 80s at present.  Patient has been fully vaccinated for COVID-19.   Past Medical History:  Diagnosis Date  . Diabetes mellitus without complication ([place])   . GERD (gastroesophageal reflux disease)   . Headache   . Hypertension     Patient Active Problem List   Diagnosis Date Noted  . Uncontrolled type 2 diabetes mellitus with hyperglycemia ([place]) 07/16/2019  . Essential hypertension, benign 07/16/2019  . Mixed hyperlipidemia 07/16/2019  . Morbid obesity ([place]) 07/16/2019  . Aortitis ([place]) 12/25/2017    Past Surgical History:  Procedure Laterality Date  . COLONOSCOPY N/A 03/17/2017   Procedure: COLONOSCOPY;  Surgeon: Daneil Dolin, MD;  Location: [facility]O SUITE;  Service: Endoscopy;  Laterality: N/A;  10:30 AM  . NO PAST SURGERIES         Family History  Problem Relation Age of Onset  . Healthy Mother   . Healthy Father   . Colon cancer Neg Hx     Social History   Tobacco Use  . Smoking status: Never Smoker  .  Smokeless tobacco: Never Used  Vaping Use  . Vaping Use: Never used  Substance Use Topics  . Alcohol use: No  . Drug use: No    Home Medications Prior to Admission medications   Medication Sig Start Date End Date Taking? Authorizing Provider  amLODipine (NORVASC) 10 MG tablet Take 1 tablet (10 mg total) by mouth daily. 06/02/20   Gosrani, Nimish C, MD  glipiZIDE (GLUCOTROL XL) 10 MG 24 hr tablet TAKE ONE TABLET (10MG  TOTAL) BY MOUTH DAILY WITH LUNCH 07/27/20   Gosrani, Nimish C, MD  hydrochlorothiazide (HYDRODIURIL) 25 MG tablet Take 1 tablet (25 mg total) by mouth daily. 07/16/19   Cassandria Anger, MD  indomethacin (INDOCIN SR) 75 MG CR capsule Take 75 mg by mouth 3 (three) times daily.    [provider]  linagliptin (TRADJENTA) 5 MG TABS tablet Take 1 tablet (5 mg total) by mouth daily. 07/01/20   Cassandria Anger, MD  losartan (COZAAR) 100 MG tablet Take 1 tablet (100 mg total) by mouth daily. 06/02/20   Doree Albee, MD  omeprazole (PRILOSEC) 40 MG capsule Take 1 capsule (40 mg total) by mouth daily. 06/02/20   Gosrani, Nimish C, MD  rosuvastatin (CRESTOR) 10 MG tablet TAKE ONE (1) TABLET BY MOUTH EVERY DAY 07/27/20   Hurshel Party C, MD  rosuvastatin (CRESTOR) 40 MG  tablet Take 1 tablet (40 mg total) by mouth at bedtime. 07/16/19   Cassandria Anger, MD  Semaglutide (RYBELSUS) 3 MG TABS Take 3 mg by mouth daily. 07/16/20   Doree Albee, MD  tamsulosin (FLOMAX) 0.4 MG CAPS capsule Take 1 capsule (0.4 mg total) by mouth daily. 06/02/20   Doree Albee, MD    Allergies    Penicillins  Review of Systems   Review of Systems  Constitutional: Negative for appetite change, chills and fever.  HENT: Negative for congestion, sore throat and trouble swallowing.   Respiratory: Positive for cough and shortness of breath. Negative for chest tightness and wheezing.   Cardiovascular: Negative for chest pain.  Gastrointestinal: Positive for diarrhea. Negative  for abdominal pain, nausea and vomiting.  Genitourinary: Negative for dysuria.  Musculoskeletal: Positive for myalgias. Negative for arthralgias.  Skin: Negative for rash.  Neurological: Negative for dizziness, weakness and numbness.  Hematological: Negative for adenopathy.  Psychiatric/Behavioral: Positive for confusion.    Physical Exam Updated Vital Signs BP (!) 154/79   Pulse (!) 116   Temp 99.6 F (37.6 C) (Oral)   Resp (!) 31   Ht 6' (1.829 m)   Wt (!) 182.8 kg   SpO2 (!) 83%   BMI 54.64 kg/m   Physical Exam Vitals and nursing note reviewed.  Constitutional:      Appearance: Robert [name] is obese. Robert [name] is not toxic-appearing.  HENT:     Nose: No rhinorrhea.  Eyes:     Conjunctiva/sclera: Conjunctivae normal.  Cardiovascular:     Rate and Rhythm: Normal rate and regular rhythm.     Pulses: Normal pulses.  Pulmonary:     Effort: Respiratory distress present.     Breath sounds: No wheezing.     Comments: Patient currently on nonrebreather,  increased work of breathing.  Scattered crackles heard at both lung bases. Abdominal:     General: There is no distension.     Palpations: Abdomen is soft.     Tenderness: There is no abdominal tenderness.  Musculoskeletal:     Cervical back: Normal range of motion.     Right lower leg: No edema.     Left lower leg: No edema.  Skin:    General: Skin is warm.     Capillary Refill: Capillary refill takes less than 2 seconds.     Findings: No rash.  Neurological:     Mental Status: Robert [name] is alert.  Psychiatric:        Mood and Affect: Mood normal.     ED Results / Procedures / Treatments   Labs (all labs ordered are listed, but only abnormal results are displayed) Labs Reviewed  RESP PANEL BY RT-PCR (FLU A&B, COVID) ARPGX2 - Abnormal; Notable for the following components:      Result Value   SARS Coronavirus 2 by RT PCR POSITIVE (*)    All other components within normal limits  BLOOD GAS, ARTERIAL - Abnormal; Notable for the  following components:   pCO2 arterial 31.9 (*)    pO2, Arterial 55.2 (*)    All other components within normal limits  LACTIC ACID, PLASMA - Abnormal; Notable for the following components:   Lactic Acid, Venous 2.4 (*)    All other components within normal limits  COMPREHENSIVE METABOLIC PANEL - Abnormal; Notable for the following components:   Glucose, Bld 256 (*)    Creatinine, Ser 1.94 (*)    Calcium 8.4 (*)    AST 43 (*)  GFR, Estimated 38 (*)    All other components within normal limits  D-DIMER, QUANTITATIVE (NOT AT [facility]) - Abnormal; Notable for the following components:   D-Dimer, Quant 1.18 (*)    All other components within normal limits  LACTATE DEHYDROGENASE - Abnormal; Notable for the following components:   LDH 343 (*)    All other components within normal limits  FERRITIN - Abnormal; Notable for the following components:   Ferritin 613 (*)    All other components within normal limits  TRIGLYCERIDES - Abnormal; Notable for the following components:   Triglycerides 155 (*)    All other components within normal limits  FIBRINOGEN - Abnormal; Notable for the following components:   Fibrinogen 717 (*)    All other components within normal limits  C-REACTIVE PROTEIN - Abnormal; Notable for the following components:   CRP 20.1 (*)    All other components within normal limits  BRAIN NATRIURETIC PEPTIDE - Abnormal; Notable for the following components:   B Natriuretic Peptide 104.0 (*)    All other components within normal limits  CULTURE, BLOOD (ROUTINE X 2)  CULTURE, BLOOD (ROUTINE X 2)  CBC WITH DIFFERENTIAL/PLATELET  PROCALCITONIN  LACTIC ACID, PLASMA    EKG None  Radiology DG Chest Port 1 View  Result Date: 08/28/2020 CLINICAL DATA:  Shortness of breath. EXAM: PORTABLE CHEST 1 VIEW COMPARISON:  None. FINDINGS: Cardiomediastinal silhouette is accentuated by low lung volumes and AP portable technique. Low lung volumes with bilateral peripheral predominant  airspace opacities. No visible pleural effusions or pneumothorax. No acute osseous abnormality. IMPRESSION: Low lung volumes with bilateral peripheral predominant airspace opacities, concerning for multifocal pneumonia. Recommend correlation with COVID testing. Electronically Signed   By: Margaretha Sheffield MD   On: 08/09/2020 11:59    Procedures Procedures (including critical care time)   CRITICAL CARE Performed by: Maybell Misenheimer Total critical care time: 35  minutes Critical care time was exclusive of separately billable procedures and treating other patients. Critical care was necessary to treat or prevent imminent or life-threatening deterioration. Critical care was time spent personally by me on the following activities: development of treatment plan with patient and/or surrogate as well as nursing, discussions with consultants, evaluation of patient's response to treatment, examination of patient, obtaining history from patient or surrogate, ordering and performing treatments and interventions, ordering and review of laboratory studies, ordering and review of radiographic studies, pulse oximetry and re-evaluation of patient's condition.  Medications Ordered in ED Medications  cefTRIAXone (ROCEPHIN) 2 g in sodium chloride 0.9 % 100 mL IVPB (2 g Intravenous New Bag/Given 08/16/2020 1232)  azithromycin (ZITHROMAX) 500 mg in sodium chloride 0.9 % 250 mL IVPB (500 mg Intravenous New Bag/Given 08/24/2020 1231)  methylPREDNISolone sodium succinate (SOLU-MEDROL) 125 mg/2 mL injection 125 mg (125 mg Intravenous Given 08/17/2020 1228)    ED Course  I have reviewed the triage vital signs and the nursing notes.  Pertinent labs & imaging results that were available during my care of the patient were reviewed by me and considered in my medical decision making (see chart for details).    MDM Rules/Calculators/A&P                          Pt here with clinical exam concerning for Covid.  Symptomatic  for 7 days.  Robert [name] has been vaccinated for COVID-19.  Vaccinations in May and June 2021  On arrival, patient hypoxic at 67% on room air.  Robert [name] was  placed on 4 L oxygen by nasal cannula without improvement.  Robert [name] was then switched to nonrebreather mask and respiratory was contacted.  Patient was placed on high flow oxygen at 15 L currently satting in the mid 40s.  Labs obtained along with chest x-ray that shows multifocal pneumonia.  Patient will be given antibiotics and IV steroids.  Covid test pending.  1300 patient Covid positive.  Robert [name] continues to have declining saturations.  Currently at 82%  Patient is critically ill.  I will consult hospitalist for admission.   BING DUFFEY was evaluated in Emergency Department on 08/16/2020 for the symptoms described in the history of present illness. Robert [name] was evaluated in the context of the global COVID-19 pandemic, which necessitated consideration that the patient might be at risk for infection with the SARS-CoV-2 virus that causes COVID-19. Institutional protocols and algorithms that pertain to the evaluation of patients at risk for COVID-19 are in a state of rapid change based on information released by regulatory bodies including the CDC and federal and state organizations. These policies and algorithms were followed during the patient's care in the ED.   [place] Dr. Darrick Meigs who agrees to admit.     Final Clinical Impression(s) / ED Diagnoses Final diagnoses:  Acute respiratory failure due to COVID-19 [facility])    Rx / DC Orders ED Discharge Orders    None       Kem Parkinson, PA-C 08/13/2020 1323    Milton Ferguson, MD 09/01/20 [phone]

## 2020-08-30 NOTE — Plan of Care (Signed)

## 2020-08-30 NOTE — ED Notes (Signed)
Pt placed on NRB at 15 L. sats remain 85 % RT notified and PA notified

## 2020-08-30 NOTE — ED Notes (Signed)
Date and time results received: 08/08/2020 1247 (use smartphrase ".now" to insert current time)  Test: covid Critical Value: positive  Name of Provider Notified: Tammy Tripplett  Orders Received? Or Actions Taken?:

## 2020-08-30 NOTE — ED Notes (Signed)
15L NRB placed over top of 60L high flow nasal canula. Pt stats or 89% at this time. Pt is A/O x4.

## 2020-08-31 ENCOUNTER — Inpatient Hospital Stay (HOSPITAL_COMMUNITY): Payer: 59

## 2020-08-31 DIAGNOSIS — J9601 Acute respiratory failure with hypoxia: Secondary | ICD-10-CM

## 2020-08-31 DIAGNOSIS — J9600 Acute respiratory failure, unspecified whether with hypoxia or hypercapnia: Secondary | ICD-10-CM

## 2020-08-31 DIAGNOSIS — U071 COVID-19: Secondary | ICD-10-CM | POA: Diagnosis not present

## 2020-08-31 LAB — CBC WITH DIFFERENTIAL/PLATELET
Abs Immature Granulocytes: 0.02 10*3/uL (ref 0.00–0.07)
Basophils Absolute: 0 10*3/uL (ref 0.0–0.1)
Basophils Relative: 0 %
Eosinophils Absolute: 0 10*3/uL (ref 0.0–0.5)
Eosinophils Relative: 0 %
HCT: 41.2 % (ref 39.0–52.0)
Hemoglobin: 13.5 g/dL (ref 13.0–17.0)
Immature Granulocytes: 0 %
Lymphocytes Relative: 15 %
Lymphs Abs: 1 10*3/uL (ref 0.7–4.0)
MCH: 28.2 pg (ref 26.0–34.0)
MCHC: 32.8 g/dL (ref 30.0–36.0)
MCV: 86 fL (ref 80.0–100.0)
Monocytes Absolute: 0.2 10*3/uL (ref 0.1–1.0)
Monocytes Relative: 3 %
Neutro Abs: 5.2 10*3/uL (ref 1.7–7.7)
Neutrophils Relative %: 82 %
Platelets: 183 10*3/uL (ref 150–400)
RBC: 4.79 MIL/uL (ref 4.22–5.81)
RDW: 13.2 % (ref 11.5–15.5)
WBC: 6.4 10*3/uL (ref 4.0–10.5)
nRBC: 0 % (ref 0.0–0.2)

## 2020-08-31 LAB — GLUCOSE, CAPILLARY
Glucose-Capillary: 390 mg/dL — ABNORMAL HIGH (ref 70–99)
Glucose-Capillary: 405 mg/dL — ABNORMAL HIGH (ref 70–99)
Glucose-Capillary: 379 mg/dL — ABNORMAL HIGH (ref 70–99)
Glucose-Capillary: 327 mg/dL — ABNORMAL HIGH (ref 70–99)
Glucose-Capillary: 362 mg/dL — ABNORMAL HIGH (ref 70–99)
Glucose-Capillary: 227 mg/dL — ABNORMAL HIGH (ref 70–99)
Glucose-Capillary: 278 mg/dL — ABNORMAL HIGH (ref 70–99)
Glucose-Capillary: 213 mg/dL — ABNORMAL HIGH (ref 70–99)
Glucose-Capillary: 190 mg/dL — ABNORMAL HIGH (ref 70–99)
Glucose-Capillary: 193 mg/dL — ABNORMAL HIGH (ref 70–99)
Glucose-Capillary: 208 mg/dL — ABNORMAL HIGH (ref 70–99)
Glucose-Capillary: 208 mg/dL — ABNORMAL HIGH (ref 70–99)
Glucose-Capillary: 207 mg/dL — ABNORMAL HIGH (ref 70–99)
Glucose-Capillary: 171 mg/dL — ABNORMAL HIGH (ref 70–99)
Glucose-Capillary: 170 mg/dL — ABNORMAL HIGH (ref 70–99)

## 2020-08-31 LAB — ECHOCARDIOGRAM LIMITED
Area-P 1/2: 5.62 cm2
Calc EF: 69.2 %
Height: 73 in
S' Lateral: 3.4 cm
Single Plane A2C EF: 76.6 %
Single Plane A4C EF: 67.4 %
Weight: 5827.2 oz

## 2020-08-31 LAB — COMPREHENSIVE METABOLIC PANEL
ALT: 27 U/L (ref 0–44)
AST: 41 U/L (ref 15–41)
Albumin: 2.8 g/dL — ABNORMAL LOW (ref 3.5–5.0)
Alkaline Phosphatase: 57 U/L (ref 38–126)
Anion gap: 14 (ref 5–15)
BUN: 23 mg/dL (ref 8–23)
CO2: 21 mmol/L — ABNORMAL LOW (ref 22–32)
Calcium: 8.6 mg/dL — ABNORMAL LOW (ref 8.9–10.3)
Chloride: 99 mmol/L (ref 98–111)
Creatinine, Ser: 2.06 mg/dL — ABNORMAL HIGH (ref 0.61–1.24)
GFR, Estimated: 36 mL/min — ABNORMAL LOW (ref >60)
Glucose, Bld: 439 mg/dL — ABNORMAL HIGH (ref 70–99)
Potassium: 4 mmol/L (ref 3.5–5.1)
Sodium: 134 mmol/L — ABNORMAL LOW (ref 135–145)
Total Bilirubin: 1.1 mg/dL (ref 0.3–1.2)
Total Protein: 7.3 g/dL (ref 6.5–8.1)

## 2020-08-31 LAB — BASIC METABOLIC PANEL
Anion gap: 14 (ref 5–15)
BUN: 36 mg/dL — ABNORMAL HIGH (ref 8–23)
CO2: 22 mmol/L (ref 22–32)
Calcium: 8.9 mg/dL (ref 8.9–10.3)
Chloride: 103 mmol/L (ref 98–111)
Creatinine, Ser: 2.09 mg/dL — ABNORMAL HIGH (ref 0.61–1.24)
GFR, Estimated: 35 mL/min — ABNORMAL LOW (ref >60)
Glucose, Bld: 212 mg/dL — ABNORMAL HIGH (ref 70–99)
Potassium: 3.8 mmol/L (ref 3.5–5.1)
Sodium: 139 mmol/L (ref 135–145)

## 2020-08-31 LAB — MAGNESIUM: Magnesium: 2 mg/dL (ref 1.7–2.4)

## 2020-08-31 LAB — D-DIMER, QUANTITATIVE: D-Dimer, Quant: 1.84 ug/mL-FEU — ABNORMAL HIGH (ref 0.00–0.50)

## 2020-08-31 LAB — C-REACTIVE PROTEIN: CRP: 24.8 mg/dL — ABNORMAL HIGH (ref <1.0)

## 2020-08-31 LAB — PHOSPHORUS: Phosphorus: 2.3 mg/dL — ABNORMAL LOW (ref 2.5–4.6)

## 2020-08-31 LAB — HIV ANTIBODY (ROUTINE TESTING W REFLEX): HIV Screen 4th Generation wRfx: NONREACTIVE

## 2020-08-31 LAB — FERRITIN: Ferritin: 744 ng/mL — ABNORMAL HIGH (ref 24–336)

## 2020-08-31 MED ORDER — SALINE SPRAY 0.65 % NA SOLN
1.0000 | NASAL | Status: DC | PRN
  Filled 2020-08-31 (×2): qty 44

## 2020-08-31 MED ORDER — ENOXAPARIN SODIUM 80 MG/0.8ML ~~LOC~~ SOLN
80.0000 mg | SUBCUTANEOUS | Status: DC
  Administered 2020-08-31 – 2020-09-05 (×6): 80 mg via SUBCUTANEOUS
  Filled 2020-08-31 (×6): qty 0.8

## 2020-08-31 MED ORDER — DOCUSATE SODIUM 100 MG PO CAPS
100.0000 mg | ORAL_CAPSULE | Freq: Two times a day (BID) | ORAL | Status: DC
  Administered 2020-08-31 – 2020-09-13 (×26): 100 mg via ORAL
  Filled 2020-08-31 (×26): qty 1

## 2020-08-31 MED ORDER — LACTATED RINGERS IV SOLN: INTRAVENOUS | Status: DC

## 2020-08-31 MED ORDER — INSULIN REGULAR(HUMAN) IN NACL 100-0.9 UT/100ML-% IV SOLN
INTRAVENOUS | Status: DC
  Administered 2020-08-31: 20 [IU]/h via INTRAVENOUS
  Administered 2020-08-31: 17 [IU]/h via INTRAVENOUS
  Filled 2020-08-31 (×3): qty 100

## 2020-08-31 MED ORDER — DEXTROSE IN LACTATED RINGERS 5 % IV SOLN: INTRAVENOUS | Status: DC

## 2020-08-31 MED ORDER — CHLORHEXIDINE GLUCONATE CLOTH 2 % EX PADS
6.0000 | MEDICATED_PAD | Freq: Every day | CUTANEOUS | Status: DC
  Administered 2020-08-31 – 2020-09-14 (×14): 6 via TOPICAL

## 2020-08-31 MED ORDER — INSULIN ASPART 100 UNIT/ML ~~LOC~~ SOLN
10.0000 [IU] | Freq: Once | SUBCUTANEOUS | Status: AC
  Administered 2020-08-31: 10 [IU] via SUBCUTANEOUS

## 2020-08-31 MED ORDER — DEXTROSE 50 % IV SOLN: 0.0000 mL | INTRAVENOUS | Status: DC | PRN

## 2020-08-31 MED ORDER — INSULIN DETEMIR 100 UNIT/ML ~~LOC~~ SOLN
23.0000 [IU] | Freq: Two times a day (BID) | SUBCUTANEOUS | Status: DC
  Filled 2020-08-31 (×3): qty 0.23

## 2020-08-31 MED ORDER — PERFLUTREN LIPID MICROSPHERE
1.0000 mL | INTRAVENOUS | Status: AC | PRN
  Administered 2020-08-31: 2 mL via INTRAVENOUS
  Filled 2020-08-31: qty 10

## 2020-08-31 NOTE — Progress Notes (Signed)
  Echocardiogram 2D Echocardiogram has been performed.  Michiel Cowboy 08/31/2020, 9:23 AM

## 2020-08-31 NOTE — Progress Notes (Signed)
Inpatient Diabetes Program Recommendations  AACE/ADA: New Consensus Statement on Inpatient Glycemic Control (2015)  Target Ranges:  Prepandial:   less than 140 mg/dL      Peak postprandial:   less than 180 mg/dL (1-2 hours)      Critically ill patients:  140 - 180 mg/dL   Lab Results  Component Value Date   GLUCAP 405 (H) 08/31/2020   HGBA1C 7.9 (H) 08/13/2020    Review of Glycemic Control Results for Robert [name], Robert [name] (MRN [id number]) as of 08/31/2020 10:17  Ref. Range 08/03/2020 17:43 08/12/2020 21:10 08/20/2020 23:29 08/31/2020 03:27 08/31/2020 08:08  Glucose-Capillary Latest Ref Range: 70 - 99 mg/dL 334 (H) 365 (H) 396 (H) 390 (H) 405 (H)   Diabetes history: DM 2 Outpatient Diabetes medications:  Glucotrol XL 10 mg daily Tradjenta 5 mg daily Rybelsus 3 mg daily Current orders for Inpatient glycemic control:  IV insulin/Phase 2 ICU order set Solumedrol 91 mg bid Inpatient Diabetes Program Recommendations:   Agree with IV insulin.    Thanks,  Adah Perl, RN, BC-ADM Inpatient Diabetes Coordinator Pager [phone] (8a-5p)

## 2020-08-31 NOTE — Progress Notes (Signed)
eLink Physician-Brief Progress Note Patient Name: CLAUD GOWAN DOB: 1956-05-29 MRN: [id number]   Date of Service  08/31/2020  HPI/Events of Note  Notified of nasal congestion. Also of constipation.  eICU Interventions  Nasal saline spray and colace ordered as scheduled instead of prn     Intervention Category Minor Interventions: Other:  Judd Lien 08/31/2020, 9:39 PM

## 2020-08-31 NOTE — Therapy (Signed)
Pt was desaturating into the mid 80's got the pt to lay on his side was vast improvement in WOB and oxygenation. Pt unable to completely prone.

## 2020-08-31 NOTE — Progress Notes (Signed)
eLink Physician-Brief Progress Note Patient Name: Robert [name] DOB: Dec 25, 1955 MRN: [id number]   Date of Service  08/31/2020  HPI/Events of Note  CBG 396 at 2330 check. Was 365 at 2100 check, at which time he received 5u aspart and 18u levemir. RN asks about increasing the dose.  T2DM, morbid obesity, on steroids for COVID PNA.   eICU Interventions  Give Aspart 10u [place] now to correct.  Increase Levemir to 23u BID starting in AM.     Intervention Category Intermediate Interventions: Hyperglycemia - evaluation and treatment  Charlott Rakes 08/31/2020, 12:19 AM

## 2020-08-31 NOTE — Progress Notes (Signed)
Assisted tele visit to patient with family members.  Thomas, Pamla Pangle Renee, RN  

## 2020-08-31 NOTE — Progress Notes (Signed)
NAME:  Robert [name], MRN:  [id number], DOB:  01-31-1956, LOS: 1 ADMISSION DATE:  08/19/2020, CONSULTATION DATE: 11/28 REFERRING MD:  Dr. Darrick Meigs, CHIEF COMPLAINT:  SOB  Brief History   64 y/o vaccinated male, who presented to [facility] on 11/28 with reports of shortness of breath. Found to be COVID  positive. Tx to [facility] for evaluation.   History of present illness   64 y/o vaccinated male, who presented to [facility] on 11/28 with reports of shortness of breath for 1 week.  As per patient he was in usual state of health until about 2week ago when he attended a funeral, after 1 week of that he started feeling difficulty breathing, he has been using albuterol nebulizer at home without relief. When he presented to the emergency department at any pain, his O2 sat was 67% on room air, he was placed on 50 L and 100% heated high flow nasal cannula with improvement in oxygen saturation to 85%. Patient denies headache, dizziness, chest pain, palpitation, abdominal pain, nausea, vomiting or diarrhea.  Past Medical History  HTN GERD  DM II  BMI 54.64 BPH  Significant Hospital Events   Transfer to ICU from any pain  Consults:    Procedures:    Significant Diagnostic Tests:  11/28 x-ray chest: Bilateral airspace disease  Micro Data:  11/28 influenza PCR negative 11/28 Covid PCR positive 11/28 blood cultures >> 11/28 MRSA PCR >>  Antimicrobials:  Azithromycin 11/28  Ceftriaxone 11/28 Baricitinib 11/28 >> Remdesivir 11/28 >>  Interim history/subjective:  Overall feels and breathing better.  No SOB.  No complaints.  Was on BiPAP prn overnight, now on HHFNC at 40L and NRB CBG remains in 400's tmax 100.3 Net -868 ml  Objective   Blood pressure (!) 141/84, pulse 98, temperature 99.9 F (37.7 C), temperature source Axillary, resp. rate 18, height 6\' 1"  (1.854 m), weight (!) 165.2 kg, SpO2 94 %.    Vent Mode: BIPAP FiO2 (%):  [50 %-100 %] 100 % PEEP:  [8 cmH20] 8 cmH20   Intake/Output  Summary (Last 24 hours) at 08/31/2020 0936 Last data filed at 08/31/2020 0600 Gross per 24 hour  Intake 731.82 ml  Output 1600 ml  Net -868.18 ml   Filed Weights   08/14/2020 1029 08/03/2020 1740 08/31/20 0500  Weight: (!) 182.8 kg (!) 175.5 kg (!) 165.2 kg   Examination: General:  Morbidly obese, older adult male sitting upright in bed in NAD HEENT: MM pink/moist, pupils 4/reactive Neuro: AO x4, MAE CV: rr, NSR- ST, no murmur PULM:  Non labored, mildly tachpneic, remains on HHNC/ NRB, clear anteriorly, diminished in bases GI: soft, ND/ NT,  bs+ Extremities: warm/dry, no LE edema  Skin: no rashes  Labs reviewed, sCr 1.50-> 1.94-> 2.06, AG 11-> 14,  Inflammatory markers ferritin 613-> 744, CRP 20-> 24.8 PCT < 0.1, CBC remains normal   Resolved Hospital Problem list     Assessment & Plan:  Acute hypoxic respiratory failure due to ARDS from COVID-19 pneumonia AKI on CKD stage IIIb Diabetes type 2 with hyperglycemia due to steroid Hypertension Morbid obesity BPH P:  Currently in no distress and happy hypoxic with acceptable saturations on HHFNC and NRB.  Continue BiPAP as needed for increased WOB/ and while sleeping Maintain O2 saturations for goal SpO2 > 88% Monitor closely for signs of ventilatory failure/ intubation risk  Self proning as tolerated Trend inflammatory markers Ongoing aggressive pulmonary hygiene, progress activity as tolerated  Hold further diuresis; goal even balance  Will start insulin gtt for glucose control, exacerbated by steroids w/ LR at 125 ml/hr Recheck BMET this afternoon Continue amlodipine, continue holding losartan and HCTZ Continue tamsulosin   Best practice (evaluated daily)   Diet: NPO for now Pain/Anxiety/Delirium protocol (if indicated): N/A VAP protocol (if indicated): N/A DVT prophylaxis: Subcu Lovenox- dose adjusted for weight; monitoring sCr GI prophylaxis: Famotidine Glucose control: insulin gtt Mobility: As tolerated Code  Status: Full code Disposition: continue in ICU   Labs   CBC: Recent Labs  Lab 08/20/2020 1050 08/29/2020 2032 08/31/20 0214  WBC 8.5 7.5 6.4  NEUTROABS 7.4  --  5.2  HGB 14.1 13.9 13.5  HCT 44.4 42.4 41.2  MCV 89.2 85.5 86.0  PLT 168 168 945    Basic Metabolic Panel: Recent Labs  Lab 08/10/2020 1050 08/11/2020 2032 08/31/20 0214  NA 135  --  134*  K 4.5  --  4.0  CL 101  --  99  CO2 23  --  21*  GLUCOSE 256*  --  439*  BUN 17  --  23  CREATININE 1.94* 1.99* 2.06*  CALCIUM 8.4*  --  8.6*  MG  --   --  2.0  PHOS  --   --  2.3*   GFR: Estimated Creatinine Clearance: 60 mL/min (A) (by C-G formula based on SCr of 2.06 mg/dL (H)). Recent Labs  Lab 08/05/2020 1050 08/20/2020 1238 08/15/2020 2032 08/31/20 0214  PROCALCITON <0.10  --   --   --   WBC 8.5  --  7.5 6.4  LATICACIDVEN 2.4* 1.7  --   --     Liver Function Tests: Recent Labs  Lab 08/05/2020 1050 08/31/20 0214  AST 43* 41  ALT 26 27  ALKPHOS 72 57  BILITOT 0.8 1.1  PROT 7.9 7.3  ALBUMIN 3.5 2.8*   No results for input(s): LIPASE, AMYLASE in the last 168 hours. No results for input(s): AMMONIA in the last 168 hours.  ABG    Component Value Date/Time   PHART 7.444 08/18/2020 1105   PCO2ART 31.9 (L) 08/18/2020 1105   PO2ART 55.2 (L) 08/28/2020 1105   HCO3 23.1 08/19/2020 1105   ACIDBASEDEF 2.0 08/17/2020 1105   O2SAT 86.0 08/17/2020 1105     Coagulation Profile: No results for input(s): INR, PROTIME in the last 168 hours.  Cardiac Enzymes: No results for input(s): CKTOTAL, CKMB, CKMBINDEX, TROPONINI in the last 168 hours.  HbA1C: Hemoglobin A1C  Date/Time Value Ref Range Status  12/04/2018 12:00 AM 6.9  Final   Hgb A1c MFr Bld  Date/Time Value Ref Range Status  08/11/2020 08:32 PM 7.9 (H) 4.8 - 5.6 % Final    Comment:    (NOTE) Pre diabetes:          5.7%-6.4%  Diabetes:              >6.4%  Glycemic control for   <7.0% adults with diabetes   02/03/2020 07:49 AM 6.5 (H) <5.7 % of total Hgb  Final    Comment:    For someone without known diabetes, a hemoglobin A1c value of 6.5% or greater indicates that they may have  diabetes and this should be confirmed with a follow-up  test. . For someone with known diabetes, a value <7% indicates  that their diabetes is well controlled and a value  greater than or equal to 7% indicates suboptimal  control. A1c targets should be individualized based on  duration of diabetes, age, comorbid conditions, and  other considerations. . Currently, no consensus exists regarding use of hemoglobin A1c for diagnosis of diabetes for children. .     CBG: Recent Labs  Lab 08/23/2020 1743 08/27/2020 2110 08/25/2020 2329 08/31/20 0327 08/31/20 0808  GLUCAP 334* 365* 396* 390* 405*     Total critical care time: 35 minutes   Kennieth Rad, ACNP  Pulmonary & Critical Care 08/31/2020, 9:36 AM

## 2020-09-01 ENCOUNTER — Inpatient Hospital Stay (HOSPITAL_COMMUNITY): Payer: 59

## 2020-09-01 DIAGNOSIS — J9600 Acute respiratory failure, unspecified whether with hypoxia or hypercapnia: Secondary | ICD-10-CM | POA: Diagnosis not present

## 2020-09-01 DIAGNOSIS — U071 COVID-19: Secondary | ICD-10-CM | POA: Diagnosis not present

## 2020-09-01 LAB — COMPREHENSIVE METABOLIC PANEL
ALT: 31 U/L (ref 0–44)
AST: 37 U/L (ref 15–41)
Albumin: 2.7 g/dL — ABNORMAL LOW (ref 3.5–5.0)
Alkaline Phosphatase: 53 U/L (ref 38–126)
Anion gap: 11 (ref 5–15)
BUN: 40 mg/dL — ABNORMAL HIGH (ref 8–23)
CO2: 22 mmol/L (ref 22–32)
Calcium: 8.5 mg/dL — ABNORMAL LOW (ref 8.9–10.3)
Chloride: 101 mmol/L (ref 98–111)
Creatinine, Ser: 2.05 mg/dL — ABNORMAL HIGH (ref 0.61–1.24)
GFR, Estimated: 36 mL/min — ABNORMAL LOW (ref >60)
Glucose, Bld: 292 mg/dL — ABNORMAL HIGH (ref 70–99)
Potassium: 4.1 mmol/L (ref 3.5–5.1)
Sodium: 134 mmol/L — ABNORMAL LOW (ref 135–145)
Total Bilirubin: 0.7 mg/dL (ref 0.3–1.2)
Total Protein: 6.7 g/dL (ref 6.5–8.1)

## 2020-09-01 LAB — CBC WITH DIFFERENTIAL/PLATELET
Abs Immature Granulocytes: 0.12 10*3/uL — ABNORMAL HIGH (ref 0.00–0.07)
Basophils Absolute: 0 10*3/uL (ref 0.0–0.1)
Basophils Relative: 0 %
Eosinophils Absolute: 0 10*3/uL (ref 0.0–0.5)
Eosinophils Relative: 0 %
HCT: 40 % (ref 39.0–52.0)
Hemoglobin: 13 g/dL (ref 13.0–17.0)
Immature Granulocytes: 1 %
Lymphocytes Relative: 11 %
Lymphs Abs: 1.6 10*3/uL (ref 0.7–4.0)
MCH: 28 pg (ref 26.0–34.0)
MCHC: 32.5 g/dL (ref 30.0–36.0)
MCV: 86 fL (ref 80.0–100.0)
Monocytes Absolute: 0.5 10*3/uL (ref 0.1–1.0)
Monocytes Relative: 4 %
Neutro Abs: 12.2 10*3/uL — ABNORMAL HIGH (ref 1.7–7.7)
Neutrophils Relative %: 84 %
Platelets: 231 10*3/uL (ref 150–400)
RBC: 4.65 MIL/uL (ref 4.22–5.81)
RDW: 13.3 % (ref 11.5–15.5)
WBC: 14.4 10*3/uL — ABNORMAL HIGH (ref 4.0–10.5)
nRBC: 0 % (ref 0.0–0.2)

## 2020-09-01 LAB — FERRITIN: Ferritin: 820 ng/mL — ABNORMAL HIGH (ref 24–336)

## 2020-09-01 LAB — GLUCOSE, CAPILLARY
Glucose-Capillary: 154 mg/dL — ABNORMAL HIGH (ref 70–99)
Glucose-Capillary: 166 mg/dL — ABNORMAL HIGH (ref 70–99)
Glucose-Capillary: 171 mg/dL — ABNORMAL HIGH (ref 70–99)
Glucose-Capillary: 282 mg/dL — ABNORMAL HIGH (ref 70–99)
Glucose-Capillary: 333 mg/dL — ABNORMAL HIGH (ref 70–99)
Glucose-Capillary: 342 mg/dL — ABNORMAL HIGH (ref 70–99)
Glucose-Capillary: 378 mg/dL — ABNORMAL HIGH (ref 70–99)

## 2020-09-01 LAB — C-REACTIVE PROTEIN: CRP: 8.8 mg/dL — ABNORMAL HIGH (ref <1.0)

## 2020-09-01 LAB — D-DIMER, QUANTITATIVE: D-Dimer, Quant: 1.27 ug/mL-FEU — ABNORMAL HIGH (ref 0.00–0.50)

## 2020-09-01 MED ORDER — INSULIN GLARGINE 100 UNIT/ML ~~LOC~~ SOLN
18.0000 [IU] | Freq: Every day | SUBCUTANEOUS | Status: DC
  Administered 2020-09-01: 18 [IU] via SUBCUTANEOUS
  Filled 2020-09-01 (×2): qty 0.18

## 2020-09-01 MED ORDER — INSULIN GLARGINE 100 UNIT/ML ~~LOC~~ SOLN
24.0000 [IU] | Freq: Every day | SUBCUTANEOUS | Status: DC
  Administered 2020-09-01: 24 [IU] via SUBCUTANEOUS
  Filled 2020-09-01 (×2): qty 0.24

## 2020-09-01 MED ORDER — INSULIN ASPART 100 UNIT/ML ~~LOC~~ SOLN
0.0000 [IU] | Freq: Every day | SUBCUTANEOUS | Status: DC
  Administered 2020-09-01: 4 [IU] via SUBCUTANEOUS

## 2020-09-01 MED ORDER — BARICITINIB 2 MG PO TABS
2.0000 mg | ORAL_TABLET | Freq: Every day | ORAL | Status: AC
  Administered 2020-09-02 – 2020-09-12 (×11): 2 mg via ORAL
  Filled 2020-09-01 (×2): qty 1
  Filled 2020-09-01: qty 2
  Filled 2020-09-01 (×5): qty 1
  Filled 2020-09-01: qty 2
  Filled 2020-09-01 (×2): qty 1

## 2020-09-01 MED ORDER — INSULIN ASPART 100 UNIT/ML ~~LOC~~ SOLN
0.0000 [IU] | Freq: Three times a day (TID) | SUBCUTANEOUS | Status: DC
  Administered 2020-09-01: 7 [IU] via SUBCUTANEOUS
  Administered 2020-09-01: 9 [IU] via SUBCUTANEOUS
  Administered 2020-09-01: 5 [IU] via SUBCUTANEOUS

## 2020-09-01 MED ORDER — INSULIN ASPART 100 UNIT/ML ~~LOC~~ SOLN
4.0000 [IU] | Freq: Three times a day (TID) | SUBCUTANEOUS | Status: DC
  Administered 2020-09-01 (×2): 4 [IU] via SUBCUTANEOUS

## 2020-09-01 NOTE — Progress Notes (Signed)
Inpatient Diabetes Program Recommendations  AACE/ADA: New Consensus Statement on Inpatient Glycemic Control (2015)  Target Ranges:  Prepandial:   less than 140 mg/dL      Peak postprandial:   less than 180 mg/dL (1-2 hours)      Critically ill patients:  140 - 180 mg/dL   Lab Results  Component Value Date   GLUCAP 378 (H) 09/01/2020   HGBA1C 7.9 (H) 08/15/2020    Review of Glycemic Control  Diabetes history: DM2  Outpatient Diabetes medications:   Glucotrol 10 mg QD Tradjenta 5 mg QD Rybelsus 3 mg   Current orders for Inpatient glycemic control:   Lantus 24 units QHS Novolog 0-9 units tidwc and hs + 4 units tidwc  Solumedrol 91.25 mg Q12H Prednisone 50 mg QD - to start on 12/02 CBGs 282, 378 mg/dL  Inpatient Diabetes Program Recommendations:     If FBS > 180 mg/dL, increase Lantus to 28 units QHS Increase Novolog to 6 units tidwc for meal coverage insulin  Continue to follow.  Thank you. Lorenda Peck, RD, LDN, CDE Inpatient Diabetes Coordinator [phone]

## 2020-09-01 NOTE — Progress Notes (Signed)
eLink Physician-Brief Progress Note Patient Name: Robert [name] DOB: 21-Oct-1955 MRN: [id number]   Date of Service  09/01/2020  HPI/Events of Note  CBG 170 request for transition of endotool Tolerating PO intake and was able to have dinner  eICU Interventions  Start Lantus at 18 unit tonight and continue to titrate down insulin drip     Intervention Category Major Interventions: Hyperglycemia - active titration of insulin therapy  Judd Lien 09/01/2020, 12:05 AM

## 2020-09-01 NOTE — Progress Notes (Signed)
RT attempted to place pt on Bipap for the night.  Pt stated he felt like he couldn't breath and pulled the mask off.  RT attempted to calm pt down and talk to him about trying the Bipap on again.  Pt refused.  Pt placed back on  [place] /100% + NRB.  RR 22 Sp02 94%

## 2020-09-01 NOTE — Progress Notes (Signed)
Updated pt's wife Lucheia at length by phone.   Nickolas Madrid, NP Pulmonary/Critical Care Medicine  09/01/2020  12:08 PM

## 2020-09-01 NOTE — Progress Notes (Signed)
NAME:  Robert [name], MRN:  [id number], DOB:  Dec 12, 1955, LOS: 2 ADMISSION DATE:  08/12/2020, CONSULTATION DATE: 11/28 REFERRING MD:  Dr. Darrick Meigs, CHIEF COMPLAINT:  SOB  Brief History   64 y/o vaccinated male, who presented to [facility] on 11/28 with reports of shortness of breath. Found to be COVID  positive. Tx to [facility] for evaluation.   History of present illness   64 y/o vaccinated male, who presented to [facility] on 11/28 with reports of shortness of breath for 1 week.  As per patient he was in usual state of health until about 2week ago when he attended a funeral, after 1 week of that he started feeling difficulty breathing, he has been using albuterol nebulizer at home without relief. When he presented to the emergency department at any pain, his O2 sat was 67% on room air, he was placed on 50 L and 100% heated high flow nasal cannula with improvement in oxygen saturation to 85%. Patient denies headache, dizziness, chest pain, palpitation, abdominal pain, nausea, vomiting or diarrhea.  Past Medical History  HTN GERD  DM II  BMI 54.64 BPH  Significant Hospital Events   Transfer to ICU from any pain  Consults:    Procedures:    Significant Diagnostic Tests:  11/28 x-ray chest: Bilateral airspace disease  Micro Data:  11/28 influenza PCR negative 11/28 Covid PCR positive 11/28 blood cultures >> 11/28 MRSA PCR >>  Antimicrobials:  Azithromycin 11/28  Ceftriaxone 11/28 Baricitinib 11/28 >> Remdesivir 11/28 >>  Interim history/subjective:  No sig change overnight.  Wore bipap overnight. On 45L HFNC this am.   Objective   Blood pressure (!) 142/71, pulse (!) 105, temperature 99 F (37.2 C), temperature source Axillary, resp. rate (!) 29, height 6\' 1"  (1.854 m), weight (!) 165.5 kg, SpO2 (!) 85 %.    Vent Mode: BIPAP FiO2 (%):  [60 %-100 %] 100 % PEEP:  [8 cmH20] 8 cmH20   Intake/Output Summary (Last 24 hours) at 09/01/2020 0902 Last data filed at 09/01/2020 0500 Gross per  24 hour  Intake 1819.52 ml  Output 1875 ml  Net -55.48 ml   Filed Weights   08/25/2020 1740 08/31/20 0500 09/01/20 0500  Weight: (!) 175.5 kg (!) 165.2 kg (!) 165.5 kg   Examination: General:  Pleasant, obese male, NAD  HEENT: MM pink/moist, pupils 4/reactive Neuro: AO x4, MAE CV: rr, NSR- ST, no murmur PULM:  resps even non labored on HFNC, NRB, diminished throughout  GI: soft, ND/ NT,  bs+ Extremities: warm/dry, no LE edema  Skin: no rashes   Resolved Hospital Problem list     Assessment & Plan:  Acute hypoxic respiratory failure due to ARDS from COVID-19 pneumonia PLAN -  Continue HFNC, bipap PRN and qhs Pulmonary hygiene  solumdedrol 1-2mg /kg for 3-5 -- transition to po pred ordered starting 12/2 remdesivir D3, trend LFT's  Baricitinib D3 F/u CXR in am Self proning as tol  Trend inflammatory markers  Holding further diuresis    AKI on CKD stage IIIb PLAN - Holding further diuresis  F/u chem  Holding ARB, hctz    Diabetes type 2 with hyperglycemia due to steroid PLAN -  SSI  Increase lantus to 24units SQ daily  Add 3 units meal coverage    Hypertension PLAN -  Continue amlodipine  Holding losartan, hctz   Morbid obesity PLAN -  bipap qhs for now   BPH  PLAN -  Continue tamsulosin    PT/OT if resp status  tolerates   Best practice (evaluated daily)   Diet: NPO for now Pain/Anxiety/Delirium protocol (if indicated): N/A VAP protocol (if indicated): N/A DVT prophylaxis: Subcu Lovenox- dose adjusted for weight; monitoring sCr GI prophylaxis: Famotidine Glucose control: SSI Mobility: As tolerated Code Status: Full code Disposition: continue in ICU   Labs   CBC: Recent Labs  Lab 08/12/2020 1050 08/05/2020 2032 08/31/20 0214 09/01/20 0744  WBC 8.5 7.5 6.4 14.4*  NEUTROABS 7.4  --  5.2 12.2*  HGB 14.1 13.9 13.5 13.0  HCT 44.4 42.4 41.2 40.0  MCV 89.2 85.5 86.0 86.0  PLT 168 168 183 124    Basic Metabolic Panel: Recent Labs  Lab  08/20/2020 1050 08/15/2020 2032 08/31/20 0214 08/31/20 2053 09/01/20 0744  NA 135  --  134* 139 134*  K 4.5  --  4.0 3.8 4.1  CL 101  --  99 103 101  CO2 23  --  21* 22 22  GLUCOSE 256*  --  439* 212* 292*  BUN 17  --  23 36* 40*  CREATININE 1.94* 1.99* 2.06* 2.09* 2.05*  CALCIUM 8.4*  --  8.6* 8.9 8.5*  MG  --   --  2.0  --   --   PHOS  --   --  2.3*  --   --    GFR: Estimated Creatinine Clearance: 60.3 mL/min (A) (by C-G formula based on SCr of 2.05 mg/dL (H)). Recent Labs  Lab 08/08/2020 1050 08/28/2020 1238 08/05/2020 2032 08/31/20 0214 09/01/20 0744  PROCALCITON <0.10  --   --   --   --   WBC 8.5  --  7.5 6.4 14.4*  LATICACIDVEN 2.4* 1.7  --   --   --     Liver Function Tests: Recent Labs  Lab 08/14/2020 1050 08/31/20 0214 09/01/20 0744  AST 43* 41 37  ALT 26 27 31   ALKPHOS 72 57 53  BILITOT 0.8 1.1 0.7  PROT 7.9 7.3 6.7  ALBUMIN 3.5 2.8* 2.7*   No results for input(s): LIPASE, AMYLASE in the last 168 hours. No results for input(s): AMMONIA in the last 168 hours.  ABG    Component Value Date/Time   PHART 7.444 08/09/2020 1105   PCO2ART 31.9 (L) 08/12/2020 1105   PO2ART 55.2 (L) 08/15/2020 1105   HCO3 23.1 08/19/2020 1105   ACIDBASEDEF 2.0 08/27/2020 1105   O2SAT 86.0 09/01/2020 1105     Coagulation Profile: No results for input(s): INR, PROTIME in the last 168 hours.  Cardiac Enzymes: No results for input(s): CKTOTAL, CKMB, CKMBINDEX, TROPONINI in the last 168 hours.  HbA1C: Hemoglobin A1C  Date/Time Value Ref Range Status  12/04/2018 12:00 AM 6.9  Final   Hgb A1c MFr Bld  Date/Time Value Ref Range Status  08/06/2020 08:32 PM 7.9 (H) 4.8 - 5.6 % Final    Comment:    (NOTE) Pre diabetes:          5.7%-6.4%  Diabetes:              >6.4%  Glycemic control for   <7.0% adults with diabetes   02/03/2020 07:49 AM 6.5 (H) <5.7 % of total Hgb Final    Comment:    For someone without known diabetes, a hemoglobin A1c value of 6.5% or greater indicates  that they may have  diabetes and this should be confirmed with a follow-up  test. . For someone with known diabetes, a value <7% indicates  that their diabetes is well controlled and  a value  greater than or equal to 7% indicates suboptimal  control. A1c targets should be individualized based on  duration of diabetes, age, comorbid conditions, and  other considerations. . Currently, no consensus exists regarding use of hemoglobin A1c for diagnosis of diabetes for children. .     CBG: Recent Labs  Lab 08/31/20 2225 08/31/20 2322 09/01/20 0041 09/01/20 0326 09/01/20 0741  GLUCAP 171* 170* 154* 166* 282*     Total critical care time: 35 minutes   Nickolas Madrid, NP Pulmonary/Critical Care Medicine  09/01/2020  9:02 AM

## 2020-09-02 ENCOUNTER — Inpatient Hospital Stay (HOSPITAL_COMMUNITY): Payer: 59

## 2020-09-02 DIAGNOSIS — J9601 Acute respiratory failure with hypoxia: Secondary | ICD-10-CM

## 2020-09-02 LAB — C-REACTIVE PROTEIN: CRP: 4 mg/dL — ABNORMAL HIGH (ref <1.0)

## 2020-09-02 LAB — COMPREHENSIVE METABOLIC PANEL
ALT: 30 U/L (ref 0–44)
AST: 28 U/L (ref 15–41)
Albumin: 2.7 g/dL — ABNORMAL LOW (ref 3.5–5.0)
Alkaline Phosphatase: 55 U/L (ref 38–126)
Anion gap: 11 (ref 5–15)
BUN: 47 mg/dL — ABNORMAL HIGH (ref 8–23)
CO2: 23 mmol/L (ref 22–32)
Calcium: 8.4 mg/dL — ABNORMAL LOW (ref 8.9–10.3)
Chloride: 99 mmol/L (ref 98–111)
Creatinine, Ser: 1.94 mg/dL — ABNORMAL HIGH (ref 0.61–1.24)
GFR, Estimated: 38 mL/min — ABNORMAL LOW (ref >60)
Glucose, Bld: 419 mg/dL — ABNORMAL HIGH (ref 70–99)
Potassium: 4.8 mmol/L (ref 3.5–5.1)
Sodium: 133 mmol/L — ABNORMAL LOW (ref 135–145)
Total Bilirubin: 0.7 mg/dL (ref 0.3–1.2)
Total Protein: 6.6 g/dL (ref 6.5–8.1)

## 2020-09-02 LAB — CBC WITH DIFFERENTIAL/PLATELET
Abs Immature Granulocytes: 0.13 10*3/uL — ABNORMAL HIGH (ref 0.00–0.07)
Basophils Absolute: 0 10*3/uL (ref 0.0–0.1)
Basophils Relative: 0 %
Eosinophils Absolute: 0 10*3/uL (ref 0.0–0.5)
Eosinophils Relative: 0 %
HCT: 39.1 % (ref 39.0–52.0)
Hemoglobin: 12.8 g/dL — ABNORMAL LOW (ref 13.0–17.0)
Immature Granulocytes: 1 %
Lymphocytes Relative: 8 %
Lymphs Abs: 1.3 10*3/uL (ref 0.7–4.0)
MCH: 28.2 pg (ref 26.0–34.0)
MCHC: 32.7 g/dL (ref 30.0–36.0)
MCV: 86.1 fL (ref 80.0–100.0)
Monocytes Absolute: 0.7 10*3/uL (ref 0.1–1.0)
Monocytes Relative: 5 %
Neutro Abs: 13.7 10*3/uL — ABNORMAL HIGH (ref 1.7–7.7)
Neutrophils Relative %: 86 %
Platelets: 251 10*3/uL (ref 150–400)
RBC: 4.54 MIL/uL (ref 4.22–5.81)
RDW: 13.1 % (ref 11.5–15.5)
WBC: 15.9 10*3/uL — ABNORMAL HIGH (ref 4.0–10.5)
nRBC: 0 % (ref 0.0–0.2)

## 2020-09-02 LAB — GLUCOSE, CAPILLARY
Glucose-Capillary: 405 mg/dL — ABNORMAL HIGH (ref 70–99)
Glucose-Capillary: 415 mg/dL — ABNORMAL HIGH (ref 70–99)
Glucose-Capillary: 394 mg/dL — ABNORMAL HIGH (ref 70–99)
Glucose-Capillary: 356 mg/dL — ABNORMAL HIGH (ref 70–99)
Glucose-Capillary: 327 mg/dL — ABNORMAL HIGH (ref 70–99)
Glucose-Capillary: 299 mg/dL — ABNORMAL HIGH (ref 70–99)
Glucose-Capillary: 281 mg/dL — ABNORMAL HIGH (ref 70–99)
Glucose-Capillary: 236 mg/dL — ABNORMAL HIGH (ref 70–99)
Glucose-Capillary: 202 mg/dL — ABNORMAL HIGH (ref 70–99)
Glucose-Capillary: 179 mg/dL — ABNORMAL HIGH (ref 70–99)
Glucose-Capillary: 229 mg/dL — ABNORMAL HIGH (ref 70–99)
Glucose-Capillary: 215 mg/dL — ABNORMAL HIGH (ref 70–99)
Glucose-Capillary: 196 mg/dL — ABNORMAL HIGH (ref 70–99)
Glucose-Capillary: 155 mg/dL — ABNORMAL HIGH (ref 70–99)
Glucose-Capillary: 152 mg/dL — ABNORMAL HIGH (ref 70–99)

## 2020-09-02 LAB — FERRITIN: Ferritin: 621 ng/mL — ABNORMAL HIGH (ref 24–336)

## 2020-09-02 LAB — D-DIMER, QUANTITATIVE: D-Dimer, Quant: 1.41 ug/mL-FEU — ABNORMAL HIGH (ref 0.00–0.50)

## 2020-09-02 MED ORDER — INSULIN REGULAR(HUMAN) IN NACL 100-0.9 UT/100ML-% IV SOLN
INTRAVENOUS | Status: DC
  Administered 2020-09-02: 17 [IU]/h via INTRAVENOUS
  Administered 2020-09-02: 30 [IU]/h via INTRAVENOUS
  Administered 2020-09-02: 18 [IU]/h via INTRAVENOUS
  Filled 2020-09-02 (×5): qty 100

## 2020-09-02 MED ORDER — DEXTROSE 50 % IV SOLN: 0.0000 mL | INTRAVENOUS | Status: DC | PRN

## 2020-09-02 NOTE — Progress Notes (Signed)
NAME:  Robert [name], MRN:  [id number], DOB:  1956/02/20, LOS: 3 ADMISSION DATE:  08/18/2020, CONSULTATION DATE: 11/28 REFERRING MD:  Dr. Darrick Meigs, CHIEF COMPLAINT:  SOB  Brief History   64 y/o vaccinated male, who presented to [facility] on 11/28 with reports of shortness of breath. Found to be COVID  positive. Tx to [facility] for evaluation.   History of present illness   64 y/o vaccinated male, who presented to [facility] on 11/28 with reports of shortness of breath for 1 week.  As per patient he was in usual state of health until about 2week ago when he attended a funeral, after 1 week of that he started feeling difficulty breathing, he has been using albuterol nebulizer at home without relief. When he presented to the emergency department at any pain, his O2 sat was 67% on room air, he was placed on 50 L and 100% heated high flow nasal cannula with improvement in oxygen saturation to 85%. Patient denies headache, dizziness, chest pain, palpitation, abdominal pain, nausea, vomiting or diarrhea.  Past Medical History  HTN GERD  DM II  BMI 54.64 BPH  Significant Hospital Events   Transfer to ICU from any pain  Consults:    Procedures:    Significant Diagnostic Tests:  11/28 x-ray chest: Bilateral airspace disease  Micro Data:  11/28 influenza PCR negative 11/28 Covid PCR positive 11/28 blood cultures >> 11/28 MRSA PCR >>  Antimicrobials:  Azithromycin 11/28  Ceftriaxone 11/28 Baricitinib 11/28 >> Remdesivir 11/28 >>  Interim history/subjective:  Doing well on HFNC and NRB. Weaned to FIO2 90% and 35L/min.      Objective   Blood pressure 134/75, pulse 91, temperature 98.6 F (37 C), temperature source Axillary, resp. rate (!) 36, height 6\' 1"  (1.854 m), weight (!) 165.5 kg, SpO2 (!) 89 %.    FiO2 (%):  [90 %-100 %] 90 %   Intake/Output Summary (Last 24 hours) at 09/02/2020 0913 Last data filed at 09/02/2020 0500 Gross per 24 hour  Intake 1107.61 ml  Output 3300 ml  Net -2192.39  ml   Filed Weights   08/31/20 0500 09/01/20 0500 09/02/20 0500  Weight: (!) 165.2 kg (!) 165.5 kg (!) 165.5 kg   Physical Exam: General: Obese, well-appearing, no acute distress HENT: [place], AT, OP clear, MMM Eyes: EOMI, no scleral icterus Respiratory: Diminished breath sounds bilaterally.  No crackles, wheezing or rales Cardiovascular: RRR, -M/R/G, no JVD GI: BS+, soft, nontender Extremities:-Edema,-tenderness Neuro: AAO x4, CNII-XII grossly intact  CXR 09/02/20 - Worsening bilateral opacities R>L Resolved Hospital Problem list     Assessment & Plan:  Acute hypoxic respiratory failure due to ARDS from COVID-19 pneumonia PLAN -  Continue HFNC and NRB, bipap PRN and qhs Monitor for s/sx respiratory distress Target SpO2 >85% Pulmonary hygiene  Transition to po pred ordered starting 12/2 remdesivir D3, trend LFT's  Baricitinib D3 Self proning as tol  Trend inflammatory markers  Diuresis holiday. Currently net negative 3L  AKI on CKD stage IIIb PLAN - Holding further diuresis  F/u chem  Holding ARB, hctz   Diabetes type 2 with hyperglycemia due to steroid PLAN -  SSI  Insulin gtt restarted Will re-evaluate needs and restart scheduled insulin tomorrow  Hypertension PLAN -  Continue amlodipine  Holding losartan, hctz   Morbid obesity PLAN -  bipap qhs for now   BPH  PLAN -  Continue tamsulosin    PT/OT if resp status tolerates   Best practice (evaluated daily)  Diet: Yes Pain/Anxiety/Delirium protocol (if indicated): N/A VAP protocol (if indicated): N/A DVT prophylaxis: Subcu Lovenox- dose adjusted for weight; monitoring sCr GI prophylaxis: Famotidine Glucose control: SSI Mobility: As tolerated Code Status: Full code Family Communication: Updated patient at bedside Disposition: Remain in ICU  Labs   CBC: Recent Labs  Lab 08/19/2020 1050 08/06/2020 2032 08/31/20 0214 09/01/20 0744 09/02/20 0442  WBC 8.5 7.5 6.4 14.4* 15.9*  NEUTROABS 7.4  --  5.2  12.2* 13.7*  HGB 14.1 13.9 13.5 13.0 12.8*  HCT 44.4 42.4 41.2 40.0 39.1  MCV 89.2 85.5 86.0 86.0 86.1  PLT 168 168 183 231 124    Basic Metabolic Panel: Recent Labs  Lab 08/11/2020 1050 08/22/2020 1050 08/20/2020 2032 08/31/20 0214 08/31/20 2053 09/01/20 0744 09/02/20 0442  NA 135  --   --  134* 139 134* 133*  K 4.5  --   --  4.0 3.8 4.1 4.8  CL 101  --   --  99 103 101 99  CO2 23  --   --  21* 22 22 23   GLUCOSE 256*  --   --  439* 212* 292* 419*  BUN 17  --   --  23 36* 40* 47*  CREATININE 1.94*   < > 1.99* 2.06* 2.09* 2.05* 1.94*  CALCIUM 8.4*  --   --  8.6* 8.9 8.5* 8.4*  MG  --   --   --  2.0  --   --   --   PHOS  --   --   --  2.3*  --   --   --    < > = values in this interval not displayed.   GFR: Estimated Creatinine Clearance: 63.7 mL/min (A) (by C-G formula based on SCr of 1.94 mg/dL (H)). Recent Labs  Lab 08/23/2020 1050 08/16/2020 1050 08/19/2020 1238 09/01/2020 2032 08/31/20 0214 09/01/20 0744 09/02/20 0442  PROCALCITON <0.10  --   --   --   --   --   --   WBC 8.5   < >  --  7.5 6.4 14.4* 15.9*  LATICACIDVEN 2.4*  --  1.7  --   --   --   --    < > = values in this interval not displayed.    Liver Function Tests: Recent Labs  Lab 08/03/2020 1050 08/31/20 0214 09/01/20 0744 09/02/20 0442  AST 43* 41 37 28  ALT 26 27 31 30   ALKPHOS 72 57 53 55  BILITOT 0.8 1.1 0.7 0.7  PROT 7.9 7.3 6.7 6.6  ALBUMIN 3.5 2.8* 2.7* 2.7*   No results for input(s): LIPASE, AMYLASE in the last 168 hours. No results for input(s): AMMONIA in the last 168 hours.  ABG    Component Value Date/Time   PHART 7.444 08/13/2020 1105   PCO2ART 31.9 (L) 08/05/2020 1105   PO2ART 55.2 (L) 08/12/2020 1105   HCO3 23.1 08/15/2020 1105   ACIDBASEDEF 2.0 08/05/2020 1105   O2SAT 86.0 08/15/2020 1105     Coagulation Profile: No results for input(s): INR, PROTIME in the last 168 hours.  Cardiac Enzymes: No results for input(s): CKTOTAL, CKMB, CKMBINDEX, TROPONINI in the last 168  hours.  HbA1C: Hemoglobin A1C  Date/Time Value Ref Range Status  12/04/2018 12:00 AM 6.9  Final   Hgb A1c MFr Bld  Date/Time Value Ref Range Status  08/19/2020 08:32 PM 7.9 (H) 4.8 - 5.6 % Final    Comment:    (NOTE) Pre diabetes:  5.7%-6.4%  Diabetes:              >6.4%  Glycemic control for   <7.0% adults with diabetes   02/03/2020 07:49 AM 6.5 (H) <5.7 % of total Hgb Final    Comment:    For someone without known diabetes, a hemoglobin A1c value of 6.5% or greater indicates that they may have  diabetes and this should be confirmed with a follow-up  test. . For someone with known diabetes, a value <7% indicates  that their diabetes is well controlled and a value  greater than or equal to 7% indicates suboptimal  control. A1c targets should be individualized based on  duration of diabetes, age, comorbid conditions, and  other considerations. . Currently, no consensus exists regarding use of hemoglobin A1c for diagnosis of diabetes for children. .     CBG: Recent Labs  Lab 09/01/20 1209 09/01/20 1737 09/01/20 2131 09/02/20 0801 09/02/20 0900  GLUCAP 378* 333* 342* 405* 415*     Total critical care time: 36 minutes   Rodman Pickle, M.D. [facility] Pulmonary/Critical Care Medicine 09/02/2020 9:13 AM

## 2020-09-02 NOTE — Progress Notes (Addendum)
Inpatient Diabetes Program Recommendations  AACE/ADA: New Consensus Statement on Inpatient Glycemic Control (2015)  Target Ranges:  Prepandial:   less than 140 mg/dL      Peak postprandial:   less than 180 mg/dL (1-2 hours)      Critically ill patients:  140 - 180 mg/dL   Lab Results  Component Value Date   GLUCAP 236 (H) 09/02/2020   HGBA1C 7.9 (H) 08/27/2020    Review of Glycemic Control Results for Robert [name], Robert [name] (MRN [id number]) as of 09/02/2020 15:45  Ref. Range 09/02/2020 11:03 09/02/2020 12:17 09/02/2020 13:41 09/02/2020 14:43 09/02/2020 15:43  Glucose-Capillary Latest Ref Range: 70 - 99 mg/dL 356 (H) 327 (H) 299 (H) 281 (H) 236 (H)   Diabetes history: DM 2 Outpatient Diabetes medications:  Glucotrol XL 10 mg daily, Tradjenta 5 mg daily, Rybelsus 3 mg daily Current orders for Inpatient glycemic control:  IV insulin- Solumedrol 91 mg bid  Inpatient Diabetes Program Recommendations:   Agree with restart of insulin drip.  Will follow.  May consider transitioning based on insulin drip rate?  Will follow- up with recommendations on 09/03/20.   Thanks  Adah Perl, RN, BC-ADM Inpatient Diabetes Coordinator Pager [phone] (8a-5p)

## 2020-09-02 NOTE — Therapy (Signed)
Pt turned on his side at this time with significant increase in saturation see flow sheet. Will continue to monitor.

## 2020-09-02 NOTE — Progress Notes (Signed)
Notified E-Link about 419 CBG. Will initiate hyperglycemic protocol.

## 2020-09-02 DEATH — deceased

## 2020-09-03 DIAGNOSIS — U071 COVID-19: Secondary | ICD-10-CM | POA: Diagnosis not present

## 2020-09-03 DIAGNOSIS — J9600 Acute respiratory failure, unspecified whether with hypoxia or hypercapnia: Secondary | ICD-10-CM | POA: Diagnosis not present

## 2020-09-03 LAB — GLUCOSE, CAPILLARY
Glucose-Capillary: 124 mg/dL — ABNORMAL HIGH (ref 70–99)
Glucose-Capillary: 125 mg/dL — ABNORMAL HIGH (ref 70–99)
Glucose-Capillary: 128 mg/dL — ABNORMAL HIGH (ref 70–99)
Glucose-Capillary: 137 mg/dL — ABNORMAL HIGH (ref 70–99)
Glucose-Capillary: 142 mg/dL — ABNORMAL HIGH (ref 70–99)
Glucose-Capillary: 145 mg/dL — ABNORMAL HIGH (ref 70–99)
Glucose-Capillary: 156 mg/dL — ABNORMAL HIGH (ref 70–99)
Glucose-Capillary: 175 mg/dL — ABNORMAL HIGH (ref 70–99)
Glucose-Capillary: 198 mg/dL — ABNORMAL HIGH (ref 70–99)
Glucose-Capillary: 223 mg/dL — ABNORMAL HIGH (ref 70–99)
Glucose-Capillary: 262 mg/dL — ABNORMAL HIGH (ref 70–99)
Glucose-Capillary: 315 mg/dL — ABNORMAL HIGH (ref 70–99)
Glucose-Capillary: 333 mg/dL — ABNORMAL HIGH (ref 70–99)

## 2020-09-03 LAB — CBC WITH DIFFERENTIAL/PLATELET
Abs Immature Granulocytes: 0.21 10*3/uL — ABNORMAL HIGH (ref 0.00–0.07)
Basophils Absolute: 0 10*3/uL (ref 0.0–0.1)
Basophils Relative: 0 %
Eosinophils Absolute: 0 10*3/uL (ref 0.0–0.5)
Eosinophils Relative: 0 %
HCT: 41.3 % (ref 39.0–52.0)
Hemoglobin: 13.7 g/dL (ref 13.0–17.0)
Immature Granulocytes: 1 %
Lymphocytes Relative: 6 %
Lymphs Abs: 1 10*3/uL (ref 0.7–4.0)
MCH: 28.2 pg (ref 26.0–34.0)
MCHC: 33.2 g/dL (ref 30.0–36.0)
MCV: 85 fL (ref 80.0–100.0)
Monocytes Absolute: 0.7 10*3/uL (ref 0.1–1.0)
Monocytes Relative: 4 %
Neutro Abs: 15.4 10*3/uL — ABNORMAL HIGH (ref 1.7–7.7)
Neutrophils Relative %: 89 %
Platelets: 272 10*3/uL (ref 150–400)
RBC: 4.86 MIL/uL (ref 4.22–5.81)
RDW: 12.6 % (ref 11.5–15.5)
WBC: 17.4 10*3/uL — ABNORMAL HIGH (ref 4.0–10.5)
nRBC: 0 % (ref 0.0–0.2)

## 2020-09-03 LAB — COMPREHENSIVE METABOLIC PANEL
ALT: 28 U/L (ref 0–44)
AST: 27 U/L (ref 15–41)
Albumin: 2.6 g/dL — ABNORMAL LOW (ref 3.5–5.0)
Alkaline Phosphatase: 54 U/L (ref 38–126)
Anion gap: 10 (ref 5–15)
BUN: 45 mg/dL — ABNORMAL HIGH (ref 8–23)
CO2: 26 mmol/L (ref 22–32)
Calcium: 8.8 mg/dL — ABNORMAL LOW (ref 8.9–10.3)
Chloride: 102 mmol/L (ref 98–111)
Creatinine, Ser: 1.97 mg/dL — ABNORMAL HIGH (ref 0.61–1.24)
GFR, Estimated: 38 mL/min — ABNORMAL LOW (ref >60)
Glucose, Bld: 131 mg/dL — ABNORMAL HIGH (ref 70–99)
Potassium: 4.6 mmol/L (ref 3.5–5.1)
Sodium: 138 mmol/L (ref 135–145)
Total Bilirubin: 0.6 mg/dL (ref 0.3–1.2)
Total Protein: 6.5 g/dL (ref 6.5–8.1)

## 2020-09-03 LAB — FERRITIN: Ferritin: 519 ng/mL — ABNORMAL HIGH (ref 24–336)

## 2020-09-03 LAB — C-REACTIVE PROTEIN: CRP: 1.7 mg/dL — ABNORMAL HIGH (ref <1.0)

## 2020-09-03 LAB — D-DIMER, QUANTITATIVE: D-Dimer, Quant: 2.68 ug/mL-FEU — ABNORMAL HIGH (ref 0.00–0.50)

## 2020-09-03 MED ORDER — INSULIN DETEMIR 100 UNIT/ML ~~LOC~~ SOLN
30.0000 [IU] | Freq: Two times a day (BID) | SUBCUTANEOUS | Status: DC
  Administered 2020-09-03 – 2020-09-09 (×13): 30 [IU] via SUBCUTANEOUS
  Filled 2020-09-03 (×15): qty 0.3

## 2020-09-03 MED ORDER — INSULIN ASPART 100 UNIT/ML ~~LOC~~ SOLN
0.0000 [IU] | Freq: Every day | SUBCUTANEOUS | Status: DC
  Administered 2020-09-03: 4 [IU] via SUBCUTANEOUS
  Administered 2020-09-04 – 2020-09-06 (×3): 2 [IU] via SUBCUTANEOUS
  Administered 2020-09-09: 4 [IU] via SUBCUTANEOUS
  Administered 2020-09-10: 2 [IU] via SUBCUTANEOUS
  Administered 2020-09-11: 3 [IU] via SUBCUTANEOUS

## 2020-09-03 MED ORDER — INSULIN ASPART 100 UNIT/ML ~~LOC~~ SOLN
10.0000 [IU] | Freq: Three times a day (TID) | SUBCUTANEOUS | Status: DC
  Administered 2020-09-03 – 2020-09-08 (×16): 10 [IU] via SUBCUTANEOUS

## 2020-09-03 MED ORDER — INSULIN ASPART 100 UNIT/ML ~~LOC~~ SOLN
0.0000 [IU] | Freq: Three times a day (TID) | SUBCUTANEOUS | Status: DC
  Administered 2020-09-03: 4 [IU] via SUBCUTANEOUS
  Administered 2020-09-03: 15 [IU] via SUBCUTANEOUS
  Administered 2020-09-04 – 2020-09-05 (×4): 4 [IU] via SUBCUTANEOUS
  Administered 2020-09-05: 7 [IU] via SUBCUTANEOUS
  Administered 2020-09-05: 11 [IU] via SUBCUTANEOUS
  Administered 2020-09-06: 7 [IU] via SUBCUTANEOUS
  Administered 2020-09-06: 4 [IU] via SUBCUTANEOUS
  Administered 2020-09-06: 3 [IU] via SUBCUTANEOUS
  Administered 2020-09-07: 7 [IU] via SUBCUTANEOUS
  Administered 2020-09-07: 3 [IU] via SUBCUTANEOUS
  Administered 2020-09-07: 15 [IU] via SUBCUTANEOUS
  Administered 2020-09-08: 3 [IU] via SUBCUTANEOUS
  Administered 2020-09-08: 7 [IU] via SUBCUTANEOUS
  Administered 2020-09-08: 3 [IU] via SUBCUTANEOUS
  Administered 2020-09-09: 15 [IU] via SUBCUTANEOUS
  Administered 2020-09-09: 7 [IU] via SUBCUTANEOUS
  Administered 2020-09-10: 3 [IU] via SUBCUTANEOUS
  Administered 2020-09-10 (×2): 7 [IU] via SUBCUTANEOUS
  Administered 2020-09-11: 4 [IU] via SUBCUTANEOUS
  Administered 2020-09-11: 11 [IU] via SUBCUTANEOUS
  Administered 2020-09-12 (×2): 7 [IU] via SUBCUTANEOUS
  Administered 2020-09-12: 09:00:00 3 [IU] via SUBCUTANEOUS
  Administered 2020-09-13: 12:00:00 11 [IU] via SUBCUTANEOUS
  Administered 2020-09-13: 09:00:00 3 [IU] via SUBCUTANEOUS

## 2020-09-03 NOTE — Progress Notes (Signed)
Inpatient Diabetes Program Recommendations  AACE/ADA: New Consensus Statement on Inpatient Glycemic Control (2015)  Target Ranges:  Prepandial:   less than 140 mg/dL      Peak postprandial:   less than 180 mg/dL (1-2 hours)      Critically ill patients:  140 - 180 mg/dL   Lab Results  Component Value Date   GLUCAP 223 (H) 09/03/2020   HGBA1C 7.9 (H) 08/19/2020    Review of Glycemic Control Results for Robert [name], USELMAN (MRN [id number]) as of 09/03/2020 10:02  Ref. Range 09/03/2020 04:18 09/03/2020 05:44 09/03/2020 07:50 09/03/2020 08:43  Glucose-Capillary Latest Ref Range: 70 - 99 mg/dL 145 (H) 142 (H) 137 (H) 223 (H)   Diabetes history: DM2  Outpatient Diabetes medications:  Glucotrol XL 10 mg daily, Tradjenta 5 mg daily, Rybelsus 3 mg daily Current orders for Inpatient glycemic control:  IV insulin/Prednisone 50 mg daily Inpatient Diabetes Program Recommendations:    Insulin drip rate overnight was approximately 5 units/hr.  Steroids have been tapered today.  May consider adding Levemir 30 units bid and Novolog 10 units tid with meals (to cover meals).  Also consider Novolog resistant q 4 hours.    Thanks,  Adah Perl, RN, BC-ADM Inpatient Diabetes Coordinator Pager [phone]

## 2020-09-03 NOTE — Progress Notes (Signed)
Transported pt from 7M to 5W on HFNC and NRB and Pt did well on that so RT kept [place] on S/B on placed pt on 15L HFNC and 15L NRB and will titrate O2 as tolerated. VS WNL. Spo2 94% upon RT leaving room. Receiving RN aware.

## 2020-09-03 NOTE — Progress Notes (Signed)
NAME:  Robert [name], MRN:  [id number], DOB:  04-14-1956, LOS: 4 ADMISSION DATE:  08/09/2020, CONSULTATION DATE: 11/28 REFERRING MD:  Dr. Darrick Meigs, CHIEF COMPLAINT:  SOB  Brief History   64 y/o vaccinated male, who presented to [facility] on 11/28 with a 1 week hx of of shortness of breath (recently attended a funeral). Found to be COVID positive with RA sats of 67%. Placed on 50L HFNC + NRB. Tx to [facility] for evaluation.   Past Medical History  HTN GERD  DM II  BMI 54.64 BPH  Significant Hospital Events   11/28 Admit from [facility] with COVID  12/01 Doing well on HFNC and NRB. Weaned to FIO2 90% and 35L/min.  12/02 O2 needs improved to 30L flow / 70%  Consults:  Diabetes Coordinator   Procedures:    Significant Diagnostic Tests:  CXR 11/28 >> bilateral airspace disease  Micro Data:  COVID 11/28 >> positive  Influenza 11/28 >> negative  BCx2 11/28 >>  MRSA PCR 11/28 >> negative   Antimicrobials:  Azithromycin 11/28  Ceftriaxone 11/28 Baricitinib 11/28 >> Remdesivir 11/28 >>  Interim history/subjective:  Pt with NRB in place, [place] on cheek / sats 92% Afebrile  Pt denies SOB, chest pain  I/O 2L UOP, -1.6L in last 24 hours    Objective   Blood pressure (!) 157/82, pulse 84, temperature 98.8 F (37.1 C), temperature source Axillary, resp. rate (!) 24, height 6\' 1"  (1.854 m), weight (!) 166.9 kg, SpO2 94 %.    FiO2 (%):  [80 %-85 %] 80 %   Intake/Output Summary (Last 24 hours) at 09/03/2020 1145 Last data filed at 09/03/2020 0900 Gross per 24 hour  Intake 377.29 ml  Output 1780 ml  Net -1402.71 ml   Filed Weights   09/01/20 0500 09/02/20 0500 09/03/20 0500  Weight: (!) 165.5 kg (!) 165.5 kg (!) 166.9 kg   Physical Exam: General: obese male sitting up in bed in NAD HEENT: MM pink/moist, NRB + HFNC in place but [place] on cheek / sats 92% on NRB, anicteric Neuro: AAOx4, speech clear, MAE CV: s1s2 rrr, no m/r/g PULM: non-labored on O2, lungs bilaterally diminished but clear GI:  soft, bsx4 active  Extremities: warm/dry, no overt edema  Skin: no rashes or lesions  Resolved Hospital Problem list     Assessment & Plan:   Acute hypoxic respiratory failure due to ARDS from COVID-19 pneumonia -wean O2 for sats >85% -continue BiPAP QHS if patient will wear  -pulmonary hygiene -IS, mobilize  -encourage prone positioning as patient tolerates, reinforced need with patient  -follow inflammatory markers >  Improving  -intermittent lasix dosing, hold 12/2, consider 12/3  -follow intermittent CXR  -continue remdesivir, D4 -follow LFT's with remdesivir  -Baricitinib D4, renal dose -prednisone 50 mg QD, taper to off pending O2 response    AKI on CKD Stage IIIb -hold diuresis 12/2 -Trend BMP / urinary output -Replace electrolytes as indicated -Avoid nephrotoxic agents, ensure adequate renal perfusion -hold home ARB, HCTZ   Diabetes Type 2 with Hyperglycemia due to Steroids -transition to SSI, resistant scale  -10 units novolog TID with meals -HS SSI -levemir 30 units BID   Hypertension -continue amlodipine  -hold home ARB, HCTZ  Morbid Obesity -BiPAP QHS if patient will wear   BPH  -tamsulosin   Best practice (evaluated daily)  Diet: Yes Pain/Anxiety/Delirium protocol (if indicated): N/A VAP protocol (if indicated): N/A DVT prophylaxis: Subcu Lovenox- dose adjusted for weight; monitoring sCr GI prophylaxis: Famotidine Glucose  control: SSI Mobility: As tolerated Code Status: Full code Family Communication: Updated patient at bedside 12/2 on plan of care Disposition: Transfer to 5w, to [facility] as of 12/3  Labs   CBC: Recent Labs  Lab 08/28/2020 1050 08/12/2020 1050 08/11/2020 2032 08/31/20 0214 09/01/20 0744 09/02/20 0442 09/03/20 0147  WBC 8.5   < > 7.5 6.4 14.4* 15.9* 17.4*  NEUTROABS 7.4  --   --  5.2 12.2* 13.7* 15.4*  HGB 14.1   < > 13.9 13.5 13.0 12.8* 13.7  HCT 44.4   < > 42.4 41.2 40.0 39.1 41.3  MCV 89.2   < > 85.5 86.0 86.0 86.1 85.0   PLT 168   < > 168 183 231 251 272   < > = values in this interval not displayed.    Basic Metabolic Panel: Recent Labs  Lab 08/31/20 0214 08/31/20 2053 09/01/20 0744 09/02/20 0442 09/03/20 0147  NA 134* 139 134* 133* 138  K 4.0 3.8 4.1 4.8 4.6  CL 99 103 101 99 102  CO2 21* 22 22 23 26   GLUCOSE 439* 212* 292* 419* 131*  BUN 23 36* 40* 47* 45*  CREATININE 2.06* 2.09* 2.05* 1.94* 1.97*  CALCIUM 8.6* 8.9 8.5* 8.4* 8.8*  MG 2.0  --   --   --   --   PHOS 2.3*  --   --   --   --    GFR: Estimated Creatinine Clearance: 63.1 mL/min (A) (by C-G formula based on SCr of 1.97 mg/dL (H)). Recent Labs  Lab 08/10/2020 1050 08/24/2020 1238 08/04/2020 2032 08/31/20 0214 09/01/20 0744 09/02/20 0442 09/03/20 0147  PROCALCITON <0.10  --   --   --   --   --   --   WBC 8.5  --    < > 6.4 14.4* 15.9* 17.4*  LATICACIDVEN 2.4* 1.7  --   --   --   --   --    < > = values in this interval not displayed.    Liver Function Tests: Recent Labs  Lab 08/27/2020 1050 08/31/20 0214 09/01/20 0744 09/02/20 0442 09/03/20 0147  AST 43* 41 37 28 27  ALT 26 27 31 30 28   ALKPHOS 72 57 53 55 54  BILITOT 0.8 1.1 0.7 0.7 0.6  PROT 7.9 7.3 6.7 6.6 6.5  ALBUMIN 3.5 2.8* 2.7* 2.7* 2.6*   No results for input(s): LIPASE, AMYLASE in the last 168 hours. No results for input(s): AMMONIA in the last 168 hours.  ABG    Component Value Date/Time   PHART 7.444 08/26/2020 1105   PCO2ART 31.9 (L) 08/10/2020 1105   PO2ART 55.2 (L) 08/29/2020 1105   HCO3 23.1 08/18/2020 1105   ACIDBASEDEF 2.0 08/25/2020 1105   O2SAT 86.0 08/16/2020 1105     Coagulation Profile: No results for input(s): INR, PROTIME in the last 168 hours.  Cardiac Enzymes: No results for input(s): CKTOTAL, CKMB, CKMBINDEX, TROPONINI in the last 168 hours.  HbA1C: Hemoglobin A1C  Date/Time Value Ref Range Status  12/04/2018 12:00 AM 6.9  Final   Hgb A1c MFr Bld  Date/Time Value Ref Range Status  08/04/2020 08:32 PM 7.9 (H) 4.8 - 5.6 %  Final    Comment:    (NOTE) Pre diabetes:          5.7%-6.4%  Diabetes:              >6.4%  Glycemic control for   <7.0% adults with diabetes   02/03/2020 07:49  AM 6.5 (H) <5.7 % of total Hgb Final    Comment:    For someone without known diabetes, a hemoglobin A1c value of 6.5% or greater indicates that they may have  diabetes and this should be confirmed with a follow-up  test. . For someone with known diabetes, a value <7% indicates  that their diabetes is well controlled and a value  greater than or equal to 7% indicates suboptimal  control. A1c targets should be individualized based on  duration of diabetes, age, comorbid conditions, and  other considerations. . Currently, no consensus exists regarding use of hemoglobin A1c for diagnosis of diabetes for children. .     CBG: Recent Labs  Lab 09/03/20 0544 09/03/20 0750 09/03/20 0843 09/03/20 1017 09/03/20 1128  GLUCAP 142* 137* 223* 262* 198*     Total critical care time: n/a   Noe Gens, MSN, NP-C, AGACNP-BC [facility] 09/03/2020, 11:45 AM   Please see [email or web address] for pager details.

## 2020-09-04 DIAGNOSIS — E1122 Type 2 diabetes mellitus with diabetic chronic kidney disease: Secondary | ICD-10-CM

## 2020-09-04 DIAGNOSIS — I10 Essential (primary) hypertension: Secondary | ICD-10-CM

## 2020-09-04 DIAGNOSIS — U071 COVID-19: Secondary | ICD-10-CM | POA: Diagnosis not present

## 2020-09-04 DIAGNOSIS — J9600 Acute respiratory failure, unspecified whether with hypoxia or hypercapnia: Secondary | ICD-10-CM | POA: Diagnosis not present

## 2020-09-04 DIAGNOSIS — J1282 Pneumonia due to coronavirus disease 2019: Secondary | ICD-10-CM

## 2020-09-04 DIAGNOSIS — N182 Chronic kidney disease, stage 2 (mild): Secondary | ICD-10-CM

## 2020-09-04 LAB — CBC WITH DIFFERENTIAL/PLATELET
Abs Immature Granulocytes: 0.29 10*3/uL — ABNORMAL HIGH (ref 0.00–0.07)
Basophils Absolute: 0 10*3/uL (ref 0.0–0.1)
Basophils Relative: 0 %
Eosinophils Absolute: 0 10*3/uL (ref 0.0–0.5)
Eosinophils Relative: 0 %
HCT: 43.9 % (ref 39.0–52.0)
Hemoglobin: 14.7 g/dL (ref 13.0–17.0)
Immature Granulocytes: 2 %
Lymphocytes Relative: 10 %
Lymphs Abs: 1.5 10*3/uL (ref 0.7–4.0)
MCH: 28.3 pg (ref 26.0–34.0)
MCHC: 33.5 g/dL (ref 30.0–36.0)
MCV: 84.4 fL (ref 80.0–100.0)
Monocytes Absolute: 0.8 10*3/uL (ref 0.1–1.0)
Monocytes Relative: 5 %
Neutro Abs: 12.7 10*3/uL — ABNORMAL HIGH (ref 1.7–7.7)
Neutrophils Relative %: 83 %
Platelets: 296 10*3/uL (ref 150–400)
RBC: 5.2 MIL/uL (ref 4.22–5.81)
RDW: 12.6 % (ref 11.5–15.5)
WBC: 15.3 10*3/uL — ABNORMAL HIGH (ref 4.0–10.5)
nRBC: 0 % (ref 0.0–0.2)

## 2020-09-04 LAB — CULTURE, BLOOD (ROUTINE X 2)
Culture: NO GROWTH
Culture: NO GROWTH
Special Requests: ADEQUATE

## 2020-09-04 LAB — D-DIMER, QUANTITATIVE: D-Dimer, Quant: 3.89 ug/mL-FEU — ABNORMAL HIGH (ref 0.00–0.50)

## 2020-09-04 LAB — COMPREHENSIVE METABOLIC PANEL
ALT: 29 U/L (ref 0–44)
AST: 29 U/L (ref 15–41)
Albumin: 2.6 g/dL — ABNORMAL LOW (ref 3.5–5.0)
Alkaline Phosphatase: 61 U/L (ref 38–126)
Anion gap: 11 (ref 5–15)
BUN: 34 mg/dL — ABNORMAL HIGH (ref 8–23)
CO2: 25 mmol/L (ref 22–32)
Calcium: 8.6 mg/dL — ABNORMAL LOW (ref 8.9–10.3)
Chloride: 101 mmol/L (ref 98–111)
Creatinine, Ser: 1.65 mg/dL — ABNORMAL HIGH (ref 0.61–1.24)
GFR, Estimated: 47 mL/min — ABNORMAL LOW (ref >60)
Glucose, Bld: 156 mg/dL — ABNORMAL HIGH (ref 70–99)
Potassium: 4.3 mmol/L (ref 3.5–5.1)
Sodium: 137 mmol/L (ref 135–145)
Total Bilirubin: 0.7 mg/dL (ref 0.3–1.2)
Total Protein: 6.4 g/dL — ABNORMAL LOW (ref 6.5–8.1)

## 2020-09-04 LAB — GLUCOSE, CAPILLARY
Glucose-Capillary: 161 mg/dL — ABNORMAL HIGH (ref 70–99)
Glucose-Capillary: 158 mg/dL — ABNORMAL HIGH (ref 70–99)
Glucose-Capillary: 199 mg/dL — ABNORMAL HIGH (ref 70–99)
Glucose-Capillary: 202 mg/dL — ABNORMAL HIGH (ref 70–99)

## 2020-09-04 LAB — FERRITIN: Ferritin: 579 ng/mL — ABNORMAL HIGH (ref 24–336)

## 2020-09-04 LAB — C-REACTIVE PROTEIN: CRP: 1.2 mg/dL — ABNORMAL HIGH (ref <1.0)

## 2020-09-04 MED ORDER — PANTOPRAZOLE SODIUM 40 MG PO TBEC
40.0000 mg | DELAYED_RELEASE_TABLET | Freq: Every day | ORAL | Status: DC
  Administered 2020-09-04 – 2020-09-13 (×10): 40 mg via ORAL
  Filled 2020-09-04 (×10): qty 1

## 2020-09-04 MED ORDER — INFLUENZA VAC SPLIT QUAD 0.5 ML IM SUSY
0.5000 mL | PREFILLED_SYRINGE | INTRAMUSCULAR | Status: DC
  Filled 2020-09-04: qty 0.5

## 2020-09-04 NOTE — Progress Notes (Addendum)
PROGRESS NOTE                                                                                                                                                                                                             Patient Demographics:    Robert [name], is a 64 y.o. male, DOB - 03-01-1956, ENI:[id number]  Outpatient Primary MD for the patient is Doree Albee, MD   Admit date - 08/27/2020   LOS - 5  Chief Complaint  Patient presents with  . hxpoxia       Brief Narrative: Patient is a 64 y.o. male with PMHx of CKD stage IIIb, DM-2, HTN, morbid obesity, BPH-who was admitted by PCCM on 11/28 due to severe hypoxic respiratory failure due to COVID-19 pneumonia.   Patient apparently had attended a funeral-where he apparently may have been exposed to COVID-19 infection.  COVID-19 vaccinated status: Vaccinated (second Pfizer vaccine dose> 6 months back-no booster yet)  Significant Events: 11/28>> Admit to [facility] for severe hypoxia due to COVID-19 pneumonia  Significant studies: 11/28>>Chest x-ray: Bilateral airspace opacities 11/29>> Echo: EF 61-44%, grade 1 diastolic dysfunction. 11/30>> chest x-ray: Diffuse bilateral pulmonary infiltrates. 12/1>> chest x-ray: Progressive multifocal pulmonary infiltrates.  COVID-19 medications: Steroids: 11/28>> Remdesivir: 11/28>> 12/2 Baricitinib: 11/28>>  Antibiotics: Rocephin: 11/28 x 1 Zithromax: 11/28 x 1  Microbiology data: 11/28 >>blood culture: No growth  Procedures: None  Consults: None  DVT prophylaxis: Prophylactic Lovenox    Subjective:    Robert [name] today feels somewhat better-Down to 13 L of HFNC this morning.   Assessment  & Plan :   Acute Hypoxic Resp Failure due to Covid 19 Viral pneumonia: Severe hypoxemia continues-some minimal improvement this morning-continue steroids and baricitinib.  Has finished a course of Remdesivir.  He does not  appear to have any volume overload today-does not require diuretics-we will reassess daily.  Continue supportive care-attempt to titrate down FiO2  Fever: afebrile O2 requirements:  SpO2: 97 % O2 Flow Rate (L/min): 15 L/min (nrb) FiO2 (%): 60 %   COVID-19 Labs: Recent Labs    09/02/20 0442 09/03/20 0147 09/04/20 0452  DDIMER 1.41* 2.68* 3.89*  FERRITIN 621* 519* 579*  CRP 4.0* 1.7* 1.2*       Component Value Date/Time   BNP 104.0 (H) 08/11/2020 1054    Recent Labs  Lab 08/29/2020  [place] <0.10    Lab Results  Component Value Date   SARSCOV2NAA POSITIVE (A) 08/09/2020    Prone/Incentive Spirometry: encouraged patient to lie prone for 3-4 hours at a time for a total of 16 hours a day, and to encourage incentive spirometry use 3-4/hour.  Elevated D-dimer: Secondary to COVID-19 related infection-check Doppler.  Maintain on prophylactic Lovenox for now.  AKI on CKD stage IIIb: AKI likely hemodynamically mediated-improving with supportive care.  Follow electrolytes closely.  DM-2 (A1c 7.9 on 11/28) with uncontrolled hyperglycemia due to steroids: CBGs improving-continue Levemir 30 units twice daily, 10 units of NovoLog with meals and resistant SSI.  Follow and adjust.  Recent Labs    09/03/20 2119 09/04/20 0736 09/04/20 1141  GLUCAP 333* 161* 158*   HTN: BP controlled-continue amlodipine.  HLD: Continue statin  BPH: Continue Flomax  Presumed OSA: On HFNC-we will need outpatient sleep study.  Morbid obesity Estimated body mass index is 50.32 kg/m as calculated from the following:   Height as of this encounter: 6\' 1"  (1.854 m).   Weight as of this encounter: 173 kg.    GI prophylaxis: PPI  ABG:    Component Value Date/Time   PHART 7.444 08/15/2020 1105   PCO2ART 31.9 (L) 08/08/2020 1105   PO2ART 55.2 (L) 08/22/2020 1105   HCO3 23.1 08/09/2020 1105   ACIDBASEDEF 2.0 08/26/2020 1105   O2SAT 86.0 08/03/2020 1105    Vent Settings:  FiO2 (%):   [60 %] 60 %  Condition - Extremely Guarded  Family Communication  :  Spouse Briscoe Burns [phone]) left voicemail on 12/20 Addendum-spouse called back-updated-all questions answered.  Code Status :  Full Code  Diet :  Diet Order            Diet Carb Modified Fluid consistency: Thin; Room service appropriate? Yes  Diet effective now                  Disposition Plan  :   Status is: Inpatient  Remains inpatient appropriate because:Inpatient level of care appropriate due to severity of illness   Dispo: The patient is from: Home              Anticipated d/c is to: Home              Anticipated d/c date is: > 3 days              Patient currently is not medically stable to d/c.   Barriers to discharge: Hypoxia requiring O2 supplementation/complete 5 days of IV Remdesivir  Antimicorbials  :    Anti-infectives (From admission, onward)   Start     Dose/Rate Route Frequency Ordered Stop   08/31/20 1000  remdesivir 100 mg in sodium chloride 0.9 % 100 mL IVPB  Status:  Discontinued       "Followed by" Linked Group Details   100 mg 200 mL/hr over 30 Minutes Intravenous Daily 08/25/2020 2023 08/17/2020 2113   08/31/20 1000  remdesivir 100 mg in sodium chloride 0.9 % 100 mL IVPB        100 mg 200 mL/hr over 30 Minutes Intravenous Daily 08/09/2020 1428 09/03/20 1057   08/29/2020 2022  remdesivir 200 mg in sodium chloride 0.9% 250 mL IVPB  Status:  Discontinued       "Followed by" Linked Group Details   200 mg 580 mL/hr over 30 Minutes Intravenous Once 08/25/2020 2023 08/08/2020 2113   08/09/2020 1430  remdesivir 100 mg in sodium  chloride 0.9 % 100 mL IVPB        100 mg 200 mL/hr over 30 Minutes Intravenous Every 30 min 08/22/2020 1428 08/19/2020 1639   08/13/2020 1145  cefTRIAXone (ROCEPHIN) 2 g in sodium chloride 0.9 % 100 mL IVPB        2 g 200 mL/hr over 30 Minutes Intravenous  Once 08/16/2020 1136 08/15/2020 1409   08/06/2020 1145  azithromycin (ZITHROMAX) 500 mg in sodium chloride 0.9 % 250  mL IVPB  Status:  Discontinued        500 mg 250 mL/hr over 60 Minutes Intravenous Every 24 hours 08/29/2020 1136 08/31/20 0853      Inpatient Medications  Scheduled Meds: . amLODipine  10 mg Oral Daily  . vitamin C  500 mg Oral Daily  . baricitinib  2 mg Oral Daily  . Chlorhexidine Gluconate Cloth  6 each Topical Daily  . docusate sodium  100 mg Oral BID  . enoxaparin (LOVENOX) injection  80 mg Subcutaneous Q24H  . famotidine  20 mg Oral BID  . [START ON 09/05/2020] influenza vac split quadrivalent PF  0.5 mL Intramuscular Tomorrow-1000  . insulin aspart  0-20 Units Subcutaneous TID WC  . insulin aspart  0-5 Units Subcutaneous QHS  . insulin aspart  10 Units Subcutaneous TID WC  . insulin detemir  30 Units Subcutaneous BID  . predniSONE  50 mg Oral Daily  . rosuvastatin  40 mg Oral QHS  . tamsulosin  0.4 mg Oral Daily  . zinc sulfate  220 mg Oral Daily   Continuous Infusions: . insulin Stopped (09/03/20 1401)   PRN Meds:.dextrose, ondansetron **OR** ondansetron (ZOFRAN) IV, polyethylene glycol, sodium chloride   Time Spent in minutes  35   See all Orders from today for further details   Oren Binet M.D on 09/04/2020 at 11:46 AM  To page go to [email or web address] - use universal password  [facility] -  Office  [phone]    Objective:   Vitals:   09/03/20 1943 09/03/20 2200 09/03/20 2342 09/04/20 0355  BP: (!) 173/79  (!) 146/56 (!) 165/77  Pulse: 99 90 99 91  Resp: 20 (!) 21 14 19   Temp: 97.9 F (36.6 C)  97.6 F (36.4 C) 98.9 F (37.2 C)  TempSrc: Oral  Oral Axillary  SpO2: 92% 99% 91% 97%  Weight:    (!) 173 kg  Height:        Wt Readings from Last 3 Encounters:  09/04/20 (!) 173 kg  07/16/20 (!) 184 kg  04/28/20 (!) 180.1 kg     Intake/Output Summary (Last 24 hours) at 09/04/2020 1146 Last data filed at 09/04/2020 0849 Gross per 24 hour  Intake 869.18 ml  Output 1925 ml  Net -1055.82 ml     Physical Exam Gen Exam:Alert awake-not in  any distress HEENT:atraumatic, normocephalic Chest: B/L clear to auscultation anteriorly CVS:S1S2 regular Abdomen:soft non tender, non distended Extremities:no edema Neurology: Non focal Skin: no rash   Data Review:    CBC Recent Labs  Lab 08/31/20 0214 09/01/20 0744 09/02/20 0442 09/03/20 0147 09/04/20 0452  WBC 6.4 14.4* 15.9* 17.4* 15.3*  HGB 13.5 13.0 12.8* 13.7 14.7  HCT 41.2 40.0 39.1 41.3 43.9  PLT 183 231 251 272 296  MCV 86.0 86.0 86.1 85.0 84.4  MCH 28.2 28.0 28.2 28.2 28.3  MCHC 32.8 32.5 32.7 33.2 33.5  RDW 13.2 13.3 13.1 12.6 12.6  LYMPHSABS 1.0 1.6 1.3 1.0 1.5  MONOABS 0.2 0.5 0.7 0.7  0.8  EOSABS 0.0 0.0 0.0 0.0 0.0  BASOSABS 0.0 0.0 0.0 0.0 0.0    Chemistries  Recent Labs  Lab 08/31/20 0214 08/31/20 0214 08/31/20 2053 09/01/20 0744 09/02/20 0442 09/03/20 0147 09/04/20 0452  NA 134*   < > 139 134* 133* 138 137  K 4.0   < > 3.8 4.1 4.8 4.6 4.3  CL 99   < > 103 101 99 102 101  CO2 21*   < > 22 22 23 26 25   GLUCOSE 439*   < > 212* 292* 419* 131* 156*  BUN 23   < > 36* 40* 47* 45* 34*  CREATININE 2.06*   < > 2.09* 2.05* 1.94* 1.97* 1.65*  CALCIUM 8.6*   < > 8.9 8.5* 8.4* 8.8* 8.6*  MG 2.0  --   --   --   --   --   --   AST 41  --   --  37 28 27 29   ALT 27  --   --  31 30 28 29   ALKPHOS 57  --   --  53 55 54 61  BILITOT 1.1  --   --  0.7 0.7 0.6 0.7   < > = values in this interval not displayed.   ------------------------------------------------------------------------------------------------------------------ No results for input(s): CHOL, HDL, LDLCALC, TRIG, CHOLHDL, LDLDIRECT in the last 72 hours.  Lab Results  Component Value Date   HGBA1C 7.9 (H) 08/31/2020   ------------------------------------------------------------------------------------------------------------------ No results for input(s): TSH, T4TOTAL, T3FREE, THYROIDAB in the last 72 hours.  Invalid input(s):  FREET3 ------------------------------------------------------------------------------------------------------------------ Recent Labs    09/03/20 0147 09/04/20 0452  FERRITIN 519* 579*    Coagulation profile No results for input(s): INR, PROTIME in the last 168 hours.  Recent Labs    09/03/20 0147 09/04/20 0452  DDIMER 2.68* 3.89*    Cardiac Enzymes No results for input(s): CKMB, TROPONINI, MYOGLOBIN in the last 168 hours.  Invalid input(s): CK ------------------------------------------------------------------------------------------------------------------    Component Value Date/Time   BNP 104.0 (H) 08/11/2020 1054    Micro Results Recent Results (from the past 240 hour(s))  Resp Panel by RT-PCR (Flu A&B, Covid) Nasopharyngeal Swab     Status: Abnormal   Collection Time: 08/20/2020 10:41 AM   Specimen: Nasopharyngeal Swab; Nasopharyngeal(NP) swabs in vial transport medium  Result Value Ref Range Status   SARS Coronavirus 2 by RT PCR POSITIVE (A) NEGATIVE Final    Comment: RESULT CALLED TO, READ BACK BY AND VERIFIED WITH: GANNT E. @ [id number] ON [id number] BY HENDERSON L. (NOTE) SARS-CoV-2 target nucleic acids are DETECTED.  The SARS-CoV-2 RNA is generally detectable in upper respiratory specimens during the acute phase of infection. Positive results are indicative of the presence of the identified virus, but do not rule out bacterial infection or co-infection with other pathogens not detected by the test. Clinical correlation with patient history and other diagnostic information is necessary to determine patient infection status. The expected result is Negative.  Fact Sheet for Patients: EntrepreneurPulse.com.au  Fact Sheet for Healthcare Providers: IncredibleEmployment.be  This test is not yet approved or cleared by the [place] FDA and  has been authorized for detection and/or diagnosis of SARS-CoV-2 by FDA under an Emergency  Use Authorization (EUA).  This EUA will remain in effect (meaning this tes t can be used) for the duration of  the COVID-19 declaration under Section 564(b)(1) of the Act, 21 U.S.C. section 360bbb-3(b)(1), unless the authorization is terminated or revoked sooner.     Influenza  A by PCR NEGATIVE NEGATIVE Final   Influenza B by PCR NEGATIVE NEGATIVE Final    Comment: (NOTE) The Xpert Xpress SARS-CoV-2/FLU/RSV plus assay is intended as an aid in the diagnosis of influenza from Nasopharyngeal swab specimens and should not be used as a sole basis for treatment. Nasal washings and aspirates are unacceptable for Xpert Xpress SARS-CoV-2/FLU/RSV testing.  Fact Sheet for Patients: EntrepreneurPulse.com.au  Fact Sheet for Healthcare Providers: IncredibleEmployment.be  This test is not yet approved or cleared by the [place] FDA and has been authorized for detection and/or diagnosis of SARS-CoV-2 by FDA under an Emergency Use Authorization (EUA). This EUA will remain in effect (meaning this test can be used) for the duration of the COVID-19 declaration under Section 564(b)(1) of the Act, 21 U.S.C. section 360bbb-3(b)(1), unless the authorization is terminated or revoked.  Performed at [facility], [address], [place], [place] [address]   Blood Culture (routine x 2)     Status: None   Collection Time: 08/04/2020 10:50 AM   Specimen: BLOOD RIGHT HAND  Result Value Ref Range Status   Specimen Description   Final    BLOOD RIGHT HAND BOTTLES DRAWN AEROBIC AND ANAEROBIC   Special Requests Blood Culture adequate volume  Final   Culture   Final    NO GROWTH 5 DAYS Performed at [facility], [address], [place], [place] [address]    Report Status 09/04/2020 FINAL  Final  Blood Culture (routine x 2)     Status: None   Collection Time: 08/07/2020 10:50 AM   Specimen: BLOOD  Result Value Ref Range Status   Specimen Description BLOOD  Final   Special  Requests NONE  Final   Culture   Final    NO GROWTH 5 DAYS Performed at [facility], [address] [place], [place] [address]    Report Status 09/04/2020 FINAL  Final  MRSA PCR Screening     Status: None   Collection Time: 08/27/2020  6:05 PM   Specimen: Nasopharyngeal  Result Value Ref Range Status   MRSA by PCR NEGATIVE NEGATIVE Final    Comment:        The GeneXpert MRSA Assay (FDA approved for NASAL specimens only), is one component of a comprehensive MRSA colonization surveillance program. It is not intended to diagnose MRSA infection nor to guide or monitor treatment for MRSA infections. Performed at [facility], [place] [address], [place], [place] [address]     Radiology Reports DG Chest [place] 1 View  Result Date: 09/02/2020 CLINICAL DATA:  Respiratory failure EXAM: PORTABLE CHEST 1 VIEW COMPARISON:  09/01/2020 FINDINGS: The lungs are symmetrically expanded. There is interval progression of asymmetric mid and lower lung zone pulmonary infiltrate, likely infectious or inflammatory in nature. No pneumothorax or pleural effusion. Cardiac size within normal limits. The pulmonary vascularity is normal. IMPRESSION: Progressive multifocal pulmonary infiltrate, likely infectious or inflammatory. Electronically Signed   By: Fidela Salisbury MD   On: 09/02/2020 06:28   DG Chest Port 1 View  Result Date: 08/14/2020 CLINICAL DATA:  Shortness of breath. EXAM: PORTABLE CHEST 1 VIEW COMPARISON:  None. FINDINGS: Cardiomediastinal silhouette is accentuated by low lung volumes and AP portable technique. Low lung volumes with bilateral peripheral predominant airspace opacities. No visible pleural effusions or pneumothorax. No acute osseous abnormality. IMPRESSION: Low lung volumes with bilateral peripheral predominant airspace opacities, concerning for multifocal pneumonia. Recommend correlation with COVID testing. Electronically Signed   By: Margaretha Sheffield MD  On: 08/28/2020 11:59   DG  Chest Port 1V same Day  Result Date: 09/01/2020 CLINICAL DATA:  Respiratory failure. Shortness of breath. History of COVID. EXAM: PORTABLE CHEST 1 VIEW COMPARISON:  08/15/2020. FINDINGS: Cardiomegaly. Diffuse bilateral pulmonary infiltrates/edema again noted. Low lung volumes. No pleural effusion or pneumothorax. No acute bony abnormality. IMPRESSION: Cardiomegaly. Diffuse bilateral pulmonary infiltrates/edema again noted. Congestive heart failure and or bilateral pneumonia could present in this fashion. Low lung volumes. Electronically Signed   By: Marcello Moores  Register   On: 09/01/2020 09:29   ECHOCARDIOGRAM LIMITED  Result Date: 08/31/2020    ECHOCARDIOGRAM LIMITED REPORT   Patient Name:   RYDER MAN Date of Exam: 08/31/2020 Medical Rec #:  [id number]       Height:       73.0 in Accession #:    [id number]      Weight:       364.2 lb Date of Birth:  [birth year]       BSA:          2.776 m Patient Age:    51 years        BP:           142/86 mmHg Patient Gender: M               HR:           101 bpm. Exam Location:  Inpatient Procedure: Limited Echo, Cardiac Doppler, Color Doppler and Intracardiac            Opacification Agent Indications:    Chest Pain 786.50/ R07.9  History:        Patient has no prior history of Echocardiogram examinations.                 Risk Factors:Hypertension, Diabetes and Non-Smoker. GERD.  Sonographer:    Vickie Epley RDCS Referring Phys: [id number] [facility] CHAND  Sonographer Comments: Covid positive. IMPRESSIONS  1. Left ventricular ejection fraction, by estimation, is 65 to 70%. The left ventricle has normal function. The left ventricle has no regional wall motion abnormalities. There is mild left ventricular hypertrophy. Left ventricular diastolic parameters are consistent with Grade I diastolic dysfunction (impaired relaxation).  2. Right ventricule is not well visualized but grossly normal size and systolic function. Tricuspid regurgitation signal is inadequate for assessing PA  pressure.  3. The mitral valve is normal in structure. No evidence of mitral valve regurgitation.  4. The aortic valve is tricuspid. Aortic valve regurgitation is not visualized. No aortic stenosis is present.  5. Aortic dilatation noted. There is mild dilatation of the ascending aorta, measuring 39 mm. FINDINGS  Left Ventricle: Left ventricular ejection fraction, by estimation, is 65 to 70%. The left ventricle has normal function. The left ventricle has no regional wall motion abnormalities. Definity contrast agent was given IV to delineate the left ventricular  endocardial borders. The left ventricular internal cavity size was normal in size. There is mild left ventricular hypertrophy. Left ventricular diastolic parameters are consistent with Grade I diastolic dysfunction (impaired relaxation). Right Ventricle: The right ventricular size is not well visualized. Right ventricular systolic function was not well visualized. Tricuspid regurgitation signal is inadequate for assessing PA pressure. Left Atrium: Left atrial size was normal in size. Pericardium: There is no evidence of pericardial effusion. Mitral Valve: The mitral valve is normal in structure. Tricuspid Valve: The tricuspid valve is normal in structure. Tricuspid valve regurgitation is not demonstrated. Aortic Valve: The aortic valve is tricuspid. Aortic valve regurgitation  is not visualized. No aortic stenosis is present. Pulmonic Valve: The pulmonic valve was not well visualized. Pulmonic valve regurgitation is not visualized. Aorta: The aortic root is normal in size and structure and aortic dilatation noted. There is mild dilatation of the ascending aorta, measuring 39 mm. IAS/Shunts: The interatrial septum was not well visualized. LEFT VENTRICLE PLAX 2D LVIDd:         4.60 cm      Diastology LVIDs:         3.40 cm      LV e' medial:    6.42 cm/s LV PW:         1.10 cm      LV E/e' medial:  11.2 LV IVS:        1.10 cm      LV e' lateral:   9.25 cm/s  LVOT diam:     2.60 cm      LV E/e' lateral: 7.7 LV SV:         106 LV SV Index:   38 LVOT Area:     5.31 cm  LV Volumes (MOD) LV vol d, MOD A2C: 91.9 ml LV vol d, MOD A4C: 116.0 ml LV vol s, MOD A2C: 21.5 ml LV vol s, MOD A4C: 37.8 ml LV SV MOD A2C:     70.4 ml LV SV MOD A4C:     116.0 ml LV SV MOD BP:      72.2 ml LEFT ATRIUM           Index LA diam:      4.10 cm 1.48 cm/m LA Vol (A4C): 46.1 ml 16.61 ml/m  AORTIC VALVE LVOT Vmax:   108.00 cm/s LVOT Vmean:  70.200 cm/s LVOT VTI:    0.199 m  AORTA Ao Root diam: 3.80 cm Ao Asc diam:  3.90 cm MITRAL VALVE MV Area (PHT): 5.62 cm    SHUNTS MV Decel Time: 135 msec    Systemic VTI:  0.20 m MV E velocity: 71.60 cm/s  Systemic Diam: 2.60 cm MV A velocity: 90.00 cm/s MV E/A ratio:  0.80 Oswaldo Milian MD Electronically signed by Oswaldo Milian MD Signature Date/Time: 08/31/2020/12:03:23 PM    Final

## 2020-09-04 NOTE — Progress Notes (Signed)
Pt is resting well at this time. Coleta in the room. Pt is on 15 L salter and doing well. He is refusing BIPAP at this time. His O2 saturation at this time is 95 %

## 2020-09-04 NOTE — Evaluation (Signed)
Physical Therapy Evaluation Patient Details Name: Robert [name] MRN: [id number] DOB: [birth year] Today's Date: 09/04/2020   History of Present Illness  Pt is a 64 y/o male with a PMH significant for CKD stage IIIb, DM, HTN, morbid obesity, BPH. He was admitted 08/10/2020 with severe hypoxic respiratory failure 2 COVID-19 PNA. Vaccinated  Clinical Impression  PTA pt living with wife. (Pt with contradictory reports of 1 or 2 story, seems to have some difficulty with memory). Pt reports independence, working as a Therapist, [place]. Pt limited in safe mobility by increased O2 demand, generalized weakness and associate balance deficits as well as decreased endurance. Pt is min guard for bed mobility, min guard for transfers and min A for stepping to recliner with RW. Ambulation limited by 3/4 DoE and oxygen desaturation. PT recommending HHPT at discharge to improve mobility in his home environment. PT will continue to follow acutely.    HR 106-123bpm 12 L O2 at rest on HFNC SaO2 94%O2 15L O2 with activity on HFNC SaO2 dropped to 82%O2 with good pleth form unable to recover with purse lip breathing, with sitting and use of IS rebounded to 98%O2 and was able to transfer back to 12 L with SaO2 94%O2   Follow Up Recommendations Home health PT;Supervision/Assistance - 24 hour    Equipment Recommendations  Rolling walker with 5" wheels    Recommendations for Other Services OT consult     Precautions / Restrictions Precautions Precautions: None Restrictions Weight Bearing Restrictions: No      Mobility  Bed Mobility Overal bed mobility: Needs Assistance Bed Mobility: Supine to Sit     Supine to sit: Min guard     General bed mobility comments: min guard for safety, increased time and effort, heavy use of bedrail    Transfers Overall transfer level: Needs assistance Equipment used: Rolling walker (2 wheeled) Transfers: Sit to/from Stand Sit to  Stand: Min guard         General transfer comment: min guard for safety, slight dizziness that dissipated quickly, 3/4 DoE, vc for purse lip breathing  Ambulation/Gait Ambulation/Gait assistance: Min assist Gait Distance (Feet): 3 Feet Assistive device: Rolling walker (2 wheeled) Gait Pattern/deviations: Step-through pattern;Decreased step length - right;Decreased step length - left;Shuffle;Trunk flexed Gait velocity: slowed Gait velocity interpretation: <1.31 ft/sec, indicative of household ambulator General Gait Details: minA for steadying with stepping from bed to recliner, pt with SoB          Balance Overall balance assessment: Needs assistance Sitting-balance support: Feet supported;No upper extremity supported Sitting balance-Leahy Scale: Good     Standing balance support: No upper extremity supported;Bilateral upper extremity supported;Single extremity supported Standing balance-Leahy Scale: Fair                               Pertinent Vitals/Pain Pain Assessment: No/denies pain    Home Living Family/patient expects to be discharged to:: Private residence Living Arrangements: Spouse/significant other Available Help at Discharge: Family;Available 24 hours/day Type of Home: House Home Access: Stairs to enter   [employer] of Steps: 1 Home Layout: Two level Home Equipment: Walker - 4 wheels;Bedside commode;Grab bars - tub/shower Additional Comments: pt with conflicting stories about home set up     Prior Function Level of Independence: Independent         Comments: 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[place]     Journalist, newspaper  Extremity/Trunk Assessment   Upper Extremity Assessment Upper Extremity Assessment: Generalized weakness    Lower Extremity Assessment Lower Extremity Assessment: Generalized weakness       Communication   Communication: No difficulties  Cognition Arousal/Alertness: Awake/alert Behavior  During Therapy: WFL for tasks assessed/performed Overall Cognitive Status: Impaired/Different from baseline Area of Impairment: Memory                     Memory: Decreased short-term memory         General Comments: unclear about home set up       General Comments      Exercises Other Exercises Other Exercises: IS x5,max inhalation 1750 mL, does not like it because it makes his chest hurt   Assessment/Plan    PT Assessment Patient needs continued PT services  PT Problem List Decreased strength;Decreased activity tolerance;Decreased balance;Cardiopulmonary status limiting activity       PT Treatment Interventions DME instruction;Gait training;Stair training;Functional mobility training;Therapeutic activities;Therapeutic exercise;Balance training;Cognitive remediation;Patient/family education    PT Goals (Current goals can be found in the Care Plan section)  Acute Rehab PT Goals Patient Stated Goal: go fishing PT Goal Formulation: With patient Time For Goal Achievement: 09/18/20 Potential to Achieve Goals: Good    Frequency Min 3X/week   Barriers to discharge        Co-evaluation               AM-PAC PT "6 Clicks" Mobility  Outcome Measure Help needed turning from your back to your side while in a flat bed without using bedrails?: None Help needed moving from lying on your back to sitting on the side of a flat bed without using bedrails?: A Little Help needed moving to and from a bed to a chair (including a wheelchair)?: A Little Help needed standing up from a chair using your arms (e.g., wheelchair or bedside chair)?: A Little Help needed to walk in hospital room?: A Little Help needed climbing 3-5 steps with a railing? : A Lot 6 Click Score: 18    End of Session Equipment Utilized During Treatment: Gait belt;Oxygen Activity Tolerance: Patient tolerated treatment well Patient left: in chair;with call bell/phone within reach Nurse Communication:  Mobility status PT Visit Diagnosis: Unsteadiness on feet (R26.81);Other abnormalities of gait and mobility (R26.89);Muscle weakness (generalized) (M62.81)    Time: 1829-9371 PT Time Calculation (min) (ACUTE ONLY): 38 min   Charges:   PT Evaluation $PT Eval Moderate Complexity: 1 Mod PT Treatments $Therapeutic Activity: 8-22 mins        Jennavie Martinek B. Migdalia Dk PT, DPT Acute Rehabilitation Services Pager [phone] Office [phone]   [place] 09/04/2020, 2:54 PM

## 2020-09-05 ENCOUNTER — Inpatient Hospital Stay (HOSPITAL_COMMUNITY): Payer: 59

## 2020-09-05 DIAGNOSIS — R7989 Other specified abnormal findings of blood chemistry: Secondary | ICD-10-CM

## 2020-09-05 DIAGNOSIS — U071 COVID-19: Secondary | ICD-10-CM | POA: Diagnosis not present

## 2020-09-05 DIAGNOSIS — J9600 Acute respiratory failure, unspecified whether with hypoxia or hypercapnia: Secondary | ICD-10-CM | POA: Diagnosis not present

## 2020-09-05 LAB — D-DIMER, QUANTITATIVE: D-Dimer, Quant: 3.66 ug/mL-FEU — ABNORMAL HIGH (ref 0.00–0.50)

## 2020-09-05 LAB — COMPREHENSIVE METABOLIC PANEL
ALT: 24 U/L (ref 0–44)
AST: 25 U/L (ref 15–41)
Albumin: 2.4 g/dL — ABNORMAL LOW (ref 3.5–5.0)
Alkaline Phosphatase: 53 U/L (ref 38–126)
Anion gap: 8 (ref 5–15)
BUN: 29 mg/dL — ABNORMAL HIGH (ref 8–23)
CO2: 28 mmol/L (ref 22–32)
Calcium: 8.4 mg/dL — ABNORMAL LOW (ref 8.9–10.3)
Chloride: 100 mmol/L (ref 98–111)
Creatinine, Ser: 1.76 mg/dL — ABNORMAL HIGH (ref 0.61–1.24)
GFR, Estimated: 43 mL/min — ABNORMAL LOW (ref >60)
Glucose, Bld: 123 mg/dL — ABNORMAL HIGH (ref 70–99)
Potassium: 4.2 mmol/L (ref 3.5–5.1)
Sodium: 136 mmol/L (ref 135–145)
Total Bilirubin: 0.6 mg/dL (ref 0.3–1.2)
Total Protein: 6.2 g/dL — ABNORMAL LOW (ref 6.5–8.1)

## 2020-09-05 LAB — CBC
HCT: 41.6 % (ref 39.0–52.0)
Hemoglobin: 14.2 g/dL (ref 13.0–17.0)
MCH: 28.9 pg (ref 26.0–34.0)
MCHC: 34.1 g/dL (ref 30.0–36.0)
MCV: 84.7 fL (ref 80.0–100.0)
Platelets: 295 10*3/uL (ref 150–400)
RBC: 4.91 MIL/uL (ref 4.22–5.81)
RDW: 12.6 % (ref 11.5–15.5)
WBC: 12.9 10*3/uL — ABNORMAL HIGH (ref 4.0–10.5)
nRBC: 0 % (ref 0.0–0.2)

## 2020-09-05 LAB — GLUCOSE, CAPILLARY
Glucose-Capillary: 163 mg/dL — ABNORMAL HIGH (ref 70–99)
Glucose-Capillary: 263 mg/dL — ABNORMAL HIGH (ref 70–99)
Glucose-Capillary: 228 mg/dL — ABNORMAL HIGH (ref 70–99)
Glucose-Capillary: 227 mg/dL — ABNORMAL HIGH (ref 70–99)

## 2020-09-05 LAB — BRAIN NATRIURETIC PEPTIDE: B Natriuretic Peptide: 45.6 pg/mL (ref 0.0–100.0)

## 2020-09-05 LAB — C-REACTIVE PROTEIN: CRP: 9.8 mg/dL — ABNORMAL HIGH (ref <1.0)

## 2020-09-05 MED ORDER — FUROSEMIDE 10 MG/ML IJ SOLN
40.0000 mg | Freq: Once | INTRAMUSCULAR | Status: AC
  Administered 2020-09-05: 40 mg via INTRAVENOUS
  Filled 2020-09-05: qty 4

## 2020-09-05 MED ORDER — ENOXAPARIN SODIUM 300 MG/3ML IJ SOLN
1.0000 mg/kg | Freq: Two times a day (BID) | INTRAMUSCULAR | Status: DC
  Administered 2020-09-05 – 2020-09-10 (×11): 175 mg via SUBCUTANEOUS
  Filled 2020-09-05 (×13): qty 1.75

## 2020-09-05 NOTE — Progress Notes (Signed)
Lower extremity venous bilateral study completed.   Please see CV Proc for preliminary results.   Primo Innis, RDMS  

## 2020-09-05 NOTE — Progress Notes (Signed)
PROGRESS NOTE                                                                                                                                                                                                             Patient Demographics:    Robert [name], is a 64 y.o. male, DOB - [birth year], VQM:[id number]  Outpatient Primary MD for the patient is Doree Albee, MD   Admit date - 08/25/2020   LOS - 6  Chief Complaint  Patient presents with  . hxpoxia       Brief Narrative: Patient is a 64 y.o. male with PMHx of CKD stage IIIb, DM-2, HTN, morbid obesity, BPH-who was admitted by PCCM on 11/28 due to severe hypoxic respiratory failure due to COVID-19 pneumonia.   Patient apparently had attended a funeral-where he apparently may have been exposed to COVID-19 infection.  COVID-19 vaccinated status: Vaccinated (second Pfizer vaccine dose> 6 months back-no booster yet)  Significant Events: 11/28>> Admit to [facility] for severe hypoxia due to COVID-19 pneumonia  Significant studies: 11/28>>Chest x-ray: Bilateral airspace opacities 11/29>> Echo: EF 93-26%, grade 1 diastolic dysfunction. 11/30>> chest x-ray: Diffuse bilateral pulmonary infiltrates. 12/1>> chest x-ray: Progressive multifocal pulmonary infiltrates.  COVID-19 medications: Steroids: 11/28>> Remdesivir: 11/28>> 12/2 Baricitinib: 11/28>>  Antibiotics: Rocephin: 11/28 x 1 Zithromax: 11/28 x 1  Microbiology data: 11/28 >>blood culture: No growth  Procedures: None  Consults: None  DVT prophylaxis: Prophylactic Lovenox    Subjective:   Patient in bed, appears comfortable, denies any headache, no fever, no chest pain or pressure, no shortness of breath , no abdominal pain. No focal weakness.   Assessment  & Plan :   Acute Hypoxic Resp Failure due to Covid 19 Viral pneumonia: he has moderate to severe parenchymal lung injury with breakthrough  infection, he received 2 doses of Pfizer vaccine and the second shot was 6 months ago.  Had not received his third shot.  Is been treated with combination of steroids, Remdesivir and Baricitinib.  Is now showing signs of recovery.  Monitor closely.  Encouraged to sit up in chair use I-S and flutter valve for pulmonary toiletry.  O2 requirements:  SpO2: 92 % O2 Flow Rate (L/min): 15 L/min FiO2 (%): 60 %   Prone/Incentive Spirometry: encouraged patient to lie prone for 3-4 hours at a time for a total  of 16 hours a day, and to encourage incentive spirometry use 3-4/hour.   Recent Labs  Lab 08/27/2020 1041 09/01/2020 1050 08/22/2020 1054 08/07/2020 1238 08/09/2020 2032 09/01/20 0744 09/02/20 0442 09/03/20 0147 09/04/20 0452 09/05/20 0243  WBC  --  8.5  --   --    < > 14.4* 15.9* 17.4* 15.3* 12.9*  HGB  --  14.1  --   --    < > 13.0 12.8* 13.7 14.7 14.2  HCT  --  44.4  --   --    < > 40.0 39.1 41.3 43.9 41.6  PLT  --  168  --   --    < > 231 251 272 296 295  CRP  --  20.1*  --   --    < > 8.8* 4.0* 1.7* 1.2* 9.8*  BNP  --   --  104.0*  --   --   --   --   --   --  45.6  DDIMER  --  1.18*  --   --    < > 1.27* 1.41* 2.68* 3.89* 3.66*  PROCALCITON  --  <0.10  --   --   --   --   --   --   --   --   AST  --  43*  --   --    < > 37 28 27 29 25   ALT  --  26  --   --    < > 31 30 28 29 24   ALKPHOS  --  72  --   --    < > 53 55 54 61 53  BILITOT  --  0.8  --   --    < > 0.7 0.7 0.6 0.7 0.6  ALBUMIN  --  3.5  --   --    < > 2.7* 2.7* 2.6* 2.6* 2.4*  LATICACIDVEN  --  2.4*  --  1.7  --   --   --   --   --   --   SARSCOV2NAA POSITIVE*  --   --   --   --   --   --   --   --   --    < > = values in this interval not displayed.      Elevated D-dimer: Secondary to COVID-19 related infection- pending Doppler.  Switched him to therapeutic Lovenox on 09/05/2020 regardless as his D-dimer is creeping higher and he is extremely high risk for developing a DVT.  Will continue till D-dimer persistently stays below  2.  AKI on CKD stage IIIb: AKI likely hemodynamically mediated-improving with supportive care.  Follow electrolytes closely.  DM-2 (A1c 7.9 on 11/28) with uncontrolled hyperglycemia due to steroids: CBGs improving-continue Levemir 30 units twice daily, 10 units of NovoLog with meals and resistant SSI.  Follow and adjust.  Recent Labs    09/04/20 1724 09/04/20 2039 09/05/20 0807  GLUCAP 199* 202* 163*   HTN: BP controlled-continue amlodipine.  HLD: Continue statin  BPH: Continue Flomax  Presumed OSA: On HFNC-we will need outpatient sleep study.  Morbid obesity Estimated body mass index is 50.32 kg/m as calculated from the following:   Height as of this encounter: 6\' 1"  (1.854 m).   Weight as of this encounter: 173 kg.    GI prophylaxis: PPI  Condition - Extremely Guarded  Family Communication  :  Previous MD  - Spouse Briscoe Burns [phone])  on 12/20  Code Status :  Full Code  Diet :  Diet Order            Diet Carb Modified Fluid consistency: Thin; Room service appropriate? Yes  Diet effective now                  Disposition Plan  :   Status is: Inpatient  Remains inpatient appropriate because:Inpatient level of care appropriate due to severity of illness   Dispo: The patient is from: Home              Anticipated d/c is to: Home              Anticipated d/c date is: > 3 days              Patient currently is not medically stable to d/c.   Barriers to discharge: Hypoxia requiring O2 supplementation/complete 5 days of IV Remdesivir  Antimicorbials  :    Anti-infectives (From admission, onward)   Start     Dose/Rate Route Frequency Ordered Stop   08/31/20 1000  remdesivir 100 mg in sodium chloride 0.9 % 100 mL IVPB  Status:  Discontinued       "Followed by" Linked Group Details   100 mg 200 mL/hr over 30 Minutes Intravenous Daily 08/31/2020 2023 08/21/2020 2113   08/31/20 1000  remdesivir 100 mg in sodium chloride 0.9 % 100 mL IVPB        100  mg 200 mL/hr over 30 Minutes Intravenous Daily 08/07/2020 1428 09/03/20 1057   08/14/2020 2022  remdesivir 200 mg in sodium chloride 0.9% 250 mL IVPB  Status:  Discontinued       "Followed by" Linked Group Details   200 mg 580 mL/hr over 30 Minutes Intravenous Once 08/16/2020 2023 08/08/2020 2113   08/18/2020 1430  remdesivir 100 mg in sodium chloride 0.9 % 100 mL IVPB        100 mg 200 mL/hr over 30 Minutes Intravenous Every 30 min 08/23/2020 1428 08/07/2020 1639   08/28/2020 1145  cefTRIAXone (ROCEPHIN) 2 g in sodium chloride 0.9 % 100 mL IVPB        2 g 200 mL/hr over 30 Minutes Intravenous  Once 08/25/2020 1136 08/29/2020 1409   08/11/2020 1145  azithromycin (ZITHROMAX) 500 mg in sodium chloride 0.9 % 250 mL IVPB  Status:  Discontinued        500 mg 250 mL/hr over 60 Minutes Intravenous Every 24 hours 08/29/2020 1136 08/31/20 0853      Inpatient Medications  Scheduled Meds: . amLODipine  10 mg Oral Daily  . vitamin C  500 mg Oral Daily  . baricitinib  2 mg Oral Daily  . Chlorhexidine Gluconate Cloth  6 each Topical Daily  . docusate sodium  100 mg Oral BID  . enoxaparin (LOVENOX) injection  1 mg/kg Subcutaneous Q12H  . famotidine  20 mg Oral BID  . influenza vac split quadrivalent PF  0.5 mL Intramuscular Tomorrow-1000  . insulin aspart  0-20 Units Subcutaneous TID WC  . insulin aspart  0-5 Units Subcutaneous QHS  . insulin aspart  10 Units Subcutaneous TID WC  . insulin detemir  30 Units Subcutaneous BID  . pantoprazole  40 mg Oral Q1200  . predniSONE  50 mg Oral Daily  . rosuvastatin  40 mg Oral QHS  . tamsulosin  0.4 mg Oral Daily  . zinc sulfate  220 mg Oral Daily   Continuous Infusions:  PRN Meds:.[DISCONTINUED] ondansetron **OR** ondansetron (  ZOFRAN) IV, polyethylene glycol, sodium chloride   Time Spent in minutes  35   See all Orders from today for further details   Lala Lund M.D on 09/05/2020 at 11:44 AM  To page go to [email or web address] - use universal password  [facility] -  Office  [phone]    Objective:   Vitals:   09/04/20 2041 09/04/20 2044 09/05/20 0512 09/05/20 0900  BP: 109/68 109/68 (!) 116/46   Pulse: 93 97 100 (!) 109  Resp: 15 13 11 15   Temp: 98.5 F (36.9 C)  98.5 F (36.9 C)   TempSrc: Oral  Oral   SpO2: 93% 95% 94% 92%  Weight:      Height:        Wt Readings from Last 3 Encounters:  09/04/20 (!) 173 kg  07/16/20 (!) 184 kg  04/28/20 (!) 180.1 kg     Intake/Output Summary (Last 24 hours) at 09/05/2020 1144 Last data filed at 09/04/2020 1829 Gross per 24 hour  Intake 720 ml  Output 650 ml  Net 70 ml     Physical Exam  Awake Alert, No new F.N deficits, Normal affect [place].AT,PERRAL Supple Neck,No JVD, No cervical lymphadenopathy appriciated.  Symmetrical Chest wall movement, Good air movement bilaterally, few rales RRR,No Gallops, Rubs or new Murmurs, No Parasternal Heave +ve B.Sounds, Abd Soft, No tenderness, No organomegaly appriciated, No rebound - guarding or rigidity. No Cyanosis, Clubbing or edema, No new Rash or bruise    Data Review:    CBC Recent Labs  Lab 08/31/20 0214 08/31/20 0214 09/01/20 0744 09/02/20 0442 09/03/20 0147 09/04/20 0452 09/05/20 0243  WBC 6.4   < > 14.4* 15.9* 17.4* 15.3* 12.9*  HGB 13.5   < > 13.0 12.8* 13.7 14.7 14.2  HCT 41.2   < > 40.0 39.1 41.3 43.9 41.6  PLT 183   < > 231 251 272 296 295  MCV 86.0   < > 86.0 86.1 85.0 84.4 84.7  MCH 28.2   < > 28.0 28.2 28.2 28.3 28.9  MCHC 32.8   < > 32.5 32.7 33.2 33.5 34.1  RDW 13.2   < > 13.3 13.1 12.6 12.6 12.6  LYMPHSABS 1.0  --  1.6 1.3 1.0 1.5  --   MONOABS 0.2  --  0.5 0.7 0.7 0.8  --   EOSABS 0.0  --  0.0 0.0 0.0 0.0  --   BASOSABS 0.0  --  0.0 0.0 0.0 0.0  --    < > = values in this interval not displayed.    Chemistries  Recent Labs  Lab 08/31/20 0214 08/31/20 2053 09/01/20 0744 09/02/20 0442 09/03/20 0147 09/04/20 0452 09/05/20 0243  NA 134*   < > 134* 133* 138 137 136  K 4.0   < > 4.1 4.8 4.6 4.3  4.2  CL 99   < > 101 99 102 101 100  CO2 21*   < > 22 23 26 25 28   GLUCOSE 439*   < > 292* 419* 131* 156* 123*  BUN 23   < > 40* 47* 45* 34* 29*  CREATININE 2.06*   < > 2.05* 1.94* 1.97* 1.65* 1.76*  CALCIUM 8.6*   < > 8.5* 8.4* 8.8* 8.6* 8.4*  MG 2.0  --   --   --   --   --   --   AST 41  --  37 28 27 29 25   ALT 27  --  31 30 28  29  24  ALKPHOS 57  --  53 55 54 61 53  BILITOT 1.1  --  0.7 0.7 0.6 0.7 0.6   < > = values in this interval not displayed.   ------------------------------------------------------------------------------------------------------------------ No results for input(s): CHOL, HDL, LDLCALC, TRIG, CHOLHDL, LDLDIRECT in the last 72 hours.  Lab Results  Component Value Date   HGBA1C 7.9 (H) 08/28/2020   ------------------------------------------------------------------------------------------------------------------ No results for input(s): TSH, T4TOTAL, T3FREE, THYROIDAB in the last 72 hours.  Invalid input(s): FREET3 ------------------------------------------------------------------------------------------------------------------ Recent Labs    09/03/20 0147 09/04/20 0452  FERRITIN 519* 579*    Coagulation profile No results for input(s): INR, PROTIME in the last 168 hours.  Recent Labs    09/04/20 0452 09/05/20 0243  DDIMER 3.89* 3.66*    Cardiac Enzymes No results for input(s): CKMB, TROPONINI, MYOGLOBIN in the last 168 hours.  Invalid input(s): CK ------------------------------------------------------------------------------------------------------------------    Component Value Date/Time   BNP 45.6 09/05/2020 0243    Micro Results Recent Results (from the past 240 hour(s))  Resp Panel by RT-PCR (Flu A&B, Covid) Nasopharyngeal Swab     Status: Abnormal   Collection Time: 09/01/2020 10:41 AM   Specimen: Nasopharyngeal Swab; Nasopharyngeal(NP) swabs in vial transport medium  Result Value Ref Range Status   SARS Coronavirus 2 by RT PCR  POSITIVE (A) NEGATIVE Final    Comment: RESULT CALLED TO, READ BACK BY AND VERIFIED WITH: GANNT E. @ [id number] ON [id number] BY HENDERSON L. (NOTE) SARS-CoV-2 target nucleic acids are DETECTED.  The SARS-CoV-2 RNA is generally detectable in upper respiratory specimens during the acute phase of infection. Positive results are indicative of the presence of the identified virus, but do not rule out bacterial infection or co-infection with other pathogens not detected by the test. Clinical correlation with patient history and other diagnostic information is necessary to determine patient infection status. The expected result is Negative.  Fact Sheet for Patients: EntrepreneurPulse.com.au  Fact Sheet for Healthcare Providers: IncredibleEmployment.be  This test is not yet approved or cleared by the [place] FDA and  has been authorized for detection and/or diagnosis of SARS-CoV-2 by FDA under an Emergency Use Authorization (EUA).  This EUA will remain in effect (meaning this tes t can be used) for the duration of  the COVID-19 declaration under Section 564(b)(1) of the Act, 21 U.S.C. section 360bbb-3(b)(1), unless the authorization is terminated or revoked sooner.     Influenza A by PCR NEGATIVE NEGATIVE Final   Influenza B by PCR NEGATIVE NEGATIVE Final    Comment: (NOTE) The Xpert Xpress SARS-CoV-2/FLU/RSV plus assay is intended as an aid in the diagnosis of influenza from Nasopharyngeal swab specimens and should not be used as a sole basis for treatment. Nasal washings and aspirates are unacceptable for Xpert Xpress SARS-CoV-2/FLU/RSV testing.  Fact Sheet for Patients: EntrepreneurPulse.com.au  Fact Sheet for Healthcare Providers: IncredibleEmployment.be  This test is not yet approved or cleared by the [place] FDA and has been authorized for detection and/or diagnosis of SARS-CoV-2 by FDA under an  Emergency Use Authorization (EUA). This EUA will remain in effect (meaning this test can be used) for the duration of the COVID-19 declaration under Section 564(b)(1) of the Act, 21 U.S.C. section 360bbb-3(b)(1), unless the authorization is terminated or revoked.  Performed at [facility], [address], [place], [place] [address]   Blood Culture (routine x 2)     Status: None   Collection Time: 08/03/2020 10:50 AM   Specimen: BLOOD RIGHT HAND  Result Value Ref  Range Status   Specimen Description   Final    BLOOD RIGHT HAND BOTTLES DRAWN AEROBIC AND ANAEROBIC   Special Requests Blood Culture adequate volume  Final   Culture   Final    NO GROWTH 5 DAYS Performed at [facility], [address], [place], [place] [address]    Report Status 09/04/2020 FINAL  Final  Blood Culture (routine x 2)     Status: None   Collection Time: 08/15/2020 10:50 AM   Specimen: BLOOD  Result Value Ref Range Status   Specimen Description BLOOD  Final   Special Requests NONE  Final   Culture   Final    NO GROWTH 5 DAYS Performed at [facility], [address] [place], [place] [address]    Report Status 09/04/2020 FINAL  Final  MRSA PCR Screening     Status: None   Collection Time: 08/31/2020  6:05 PM   Specimen: Nasopharyngeal  Result Value Ref Range Status   MRSA by PCR NEGATIVE NEGATIVE Final    Comment:        The GeneXpert MRSA Assay (FDA approved for NASAL specimens only), is one component of a comprehensive MRSA colonization surveillance program. It is not intended to diagnose MRSA infection nor to guide or monitor treatment for MRSA infections. Performed at [facility], [place] [address], [place], [place] [address]     Radiology Reports DG Chest [place] 1 View  Result Date: 09/05/2020 CLINICAL DATA:  Shortness of breath and COVID-19 positivity EXAM: PORTABLE CHEST 1 VIEW COMPARISON:  09/02/2020 FINDINGS: Cardiac shadow is stable. Diffuse bilateral airspace opacities are again  identified slightly improved when compared with the prior exam. No new focal abnormality is noted. No bony abnormality is seen. IMPRESSION: Slight improvement in bilateral airspace disease as described. Electronically Signed   By: Inez Catalina M.D.   On: 09/05/2020 09:11   DG Chest Port 1 View  Result Date: 09/02/2020 CLINICAL DATA:  Respiratory failure EXAM: PORTABLE CHEST 1 VIEW COMPARISON:  09/01/2020 FINDINGS: The lungs are symmetrically expanded. There is interval progression of asymmetric mid and lower lung zone pulmonary infiltrate, likely infectious or inflammatory in nature. No pneumothorax or pleural effusion. Cardiac size within normal limits. The pulmonary vascularity is normal. IMPRESSION: Progressive multifocal pulmonary infiltrate, likely infectious or inflammatory. Electronically Signed   By: Fidela Salisbury MD   On: 09/02/2020 06:28   DG Chest Port 1 View  Result Date: 08/20/2020 CLINICAL DATA:  Shortness of breath. EXAM: PORTABLE CHEST 1 VIEW COMPARISON:  None. FINDINGS: Cardiomediastinal silhouette is accentuated by low lung volumes and AP portable technique. Low lung volumes with bilateral peripheral predominant airspace opacities. No visible pleural effusions or pneumothorax. No acute osseous abnormality. IMPRESSION: Low lung volumes with bilateral peripheral predominant airspace opacities, concerning for multifocal pneumonia. Recommend correlation with COVID testing. Electronically Signed   By: Margaretha Sheffield MD   On: 08/04/2020 11:59   DG Chest Port 1V same Day  Result Date: 09/01/2020 CLINICAL DATA:  Respiratory failure. Shortness of breath. History of COVID. EXAM: PORTABLE CHEST 1 VIEW COMPARISON:  08/09/2020. FINDINGS: Cardiomegaly. Diffuse bilateral pulmonary infiltrates/edema again noted. Low lung volumes. No pleural effusion or pneumothorax. No acute bony abnormality. IMPRESSION: Cardiomegaly. Diffuse bilateral pulmonary infiltrates/edema again noted. Congestive heart  failure and or bilateral pneumonia could present in this fashion. Low lung volumes. Electronically Signed   By: Marcello Moores  Register   On: 09/01/2020 09:29   ECHOCARDIOGRAM LIMITED  Result Date: 08/31/2020    ECHOCARDIOGRAM  LIMITED REPORT   Patient Name:   OLIVER HEITZENRATER Date of Exam: 08/31/2020 Medical Rec #:  [id number]       Height:       73.0 in Accession #:    [id number]      Weight:       364.2 lb Date of Birth:  1956-07-21       BSA:          2.776 m Patient Age:    76 years        BP:           142/86 mmHg Patient Gender: M               HR:           101 bpm. Exam Location:  Inpatient Procedure: Limited Echo, Cardiac Doppler, Color Doppler and Intracardiac            Opacification Agent Indications:    Chest Pain 786.50/ R07.9  History:        Patient has no prior history of Echocardiogram examinations.                 Risk Factors:Hypertension, Diabetes and Non-Smoker. GERD.  Sonographer:    Vickie Epley RDCS Referring Phys: [id number] [facility] CHAND  Sonographer Comments: Covid positive. IMPRESSIONS  1. Left ventricular ejection fraction, by estimation, is 65 to 70%. The left ventricle has normal function. The left ventricle has no regional wall motion abnormalities. There is mild left ventricular hypertrophy. Left ventricular diastolic parameters are consistent with Grade I diastolic dysfunction (impaired relaxation).  2. Right ventricule is not well visualized but grossly normal size and systolic function. Tricuspid regurgitation signal is inadequate for assessing PA pressure.  3. The mitral valve is normal in structure. No evidence of mitral valve regurgitation.  4. The aortic valve is tricuspid. Aortic valve regurgitation is not visualized. No aortic stenosis is present.  5. Aortic dilatation noted. There is mild dilatation of the ascending aorta, measuring 39 mm. FINDINGS  Left Ventricle: Left ventricular ejection fraction, by estimation, is 65 to 70%. The left ventricle has normal function. The left  ventricle has no regional wall motion abnormalities. Definity contrast agent was given IV to delineate the left ventricular  endocardial borders. The left ventricular internal cavity size was normal in size. There is mild left ventricular hypertrophy. Left ventricular diastolic parameters are consistent with Grade I diastolic dysfunction (impaired relaxation). Right Ventricle: The right ventricular size is not well visualized. Right ventricular systolic function was not well visualized. Tricuspid regurgitation signal is inadequate for assessing PA pressure. Left Atrium: Left atrial size was normal in size. Pericardium: There is no evidence of pericardial effusion. Mitral Valve: The mitral valve is normal in structure. Tricuspid Valve: The tricuspid valve is normal in structure. Tricuspid valve regurgitation is not demonstrated. Aortic Valve: The aortic valve is tricuspid. Aortic valve regurgitation is not visualized. No aortic stenosis is present. Pulmonic Valve: The pulmonic valve was not well visualized. Pulmonic valve regurgitation is not visualized. Aorta: The aortic root is normal in size and structure and aortic dilatation noted. There is mild dilatation of the ascending aorta, measuring 39 mm. IAS/Shunts: The interatrial septum was not well visualized. LEFT VENTRICLE PLAX 2D LVIDd:         4.60 cm      Diastology LVIDs:         3.40 cm      LV e' medial:    6.42 cm/s LV PW:  1.10 cm      LV E/e' medial:  11.2 LV IVS:        1.10 cm      LV e' lateral:   9.25 cm/s LVOT diam:     2.60 cm      LV E/e' lateral: 7.7 LV SV:         106 LV SV Index:   38 LVOT Area:     5.31 cm  LV Volumes (MOD) LV vol d, MOD A2C: 91.9 ml LV vol d, MOD A4C: 116.0 ml LV vol s, MOD A2C: 21.5 ml LV vol s, MOD A4C: 37.8 ml LV SV MOD A2C:     70.4 ml LV SV MOD A4C:     116.0 ml LV SV MOD BP:      72.2 ml LEFT ATRIUM           Index LA diam:      4.10 cm 1.48 cm/m LA Vol (A4C): 46.1 ml 16.61 ml/m  AORTIC VALVE LVOT Vmax:   108.00  cm/s LVOT Vmean:  70.200 cm/s LVOT VTI:    0.199 m  AORTA Ao Root diam: 3.80 cm Ao Asc diam:  3.90 cm MITRAL VALVE MV Area (PHT): 5.62 cm    SHUNTS MV Decel Time: 135 msec    Systemic VTI:  0.20 m MV E velocity: 71.60 cm/s  Systemic Diam: 2.60 cm MV A velocity: 90.00 cm/s MV E/A ratio:  0.80 Oswaldo Milian MD Electronically signed by Oswaldo Milian MD Signature Date/Time: 08/31/2020/12:03:23 PM    Final

## 2020-09-06 DIAGNOSIS — J9600 Acute respiratory failure, unspecified whether with hypoxia or hypercapnia: Secondary | ICD-10-CM | POA: Diagnosis not present

## 2020-09-06 DIAGNOSIS — U071 COVID-19: Secondary | ICD-10-CM | POA: Diagnosis not present

## 2020-09-06 LAB — CBC WITH DIFFERENTIAL/PLATELET
Abs Immature Granulocytes: 0.29 10*3/uL — ABNORMAL HIGH (ref 0.00–0.07)
Basophils Absolute: 0 10*3/uL (ref 0.0–0.1)
Basophils Relative: 0 %
Eosinophils Absolute: 0.2 10*3/uL (ref 0.0–0.5)
Eosinophils Relative: 1 %
HCT: 44.4 % (ref 39.0–52.0)
Hemoglobin: 14.6 g/dL (ref 13.0–17.0)
Immature Granulocytes: 2 %
Lymphocytes Relative: 8 %
Lymphs Abs: 1.3 10*3/uL (ref 0.7–4.0)
MCH: 27.9 pg (ref 26.0–34.0)
MCHC: 32.9 g/dL (ref 30.0–36.0)
MCV: 84.7 fL (ref 80.0–100.0)
Monocytes Absolute: 0.5 10*3/uL (ref 0.1–1.0)
Monocytes Relative: 3 %
Neutro Abs: 13.1 10*3/uL — ABNORMAL HIGH (ref 1.7–7.7)
Neutrophils Relative %: 86 %
Platelets: 336 10*3/uL (ref 150–400)
RBC: 5.24 MIL/uL (ref 4.22–5.81)
RDW: 12.5 % (ref 11.5–15.5)
WBC: 15.4 10*3/uL — ABNORMAL HIGH (ref 4.0–10.5)
nRBC: 0 % (ref 0.0–0.2)

## 2020-09-06 LAB — COMPREHENSIVE METABOLIC PANEL
ALT: 23 U/L (ref 0–44)
AST: 20 U/L (ref 15–41)
Albumin: 2.3 g/dL — ABNORMAL LOW (ref 3.5–5.0)
Alkaline Phosphatase: 57 U/L (ref 38–126)
Anion gap: 10 (ref 5–15)
BUN: 30 mg/dL — ABNORMAL HIGH (ref 8–23)
CO2: 27 mmol/L (ref 22–32)
Calcium: 8.5 mg/dL — ABNORMAL LOW (ref 8.9–10.3)
Chloride: 99 mmol/L (ref 98–111)
Creatinine, Ser: 1.65 mg/dL — ABNORMAL HIGH (ref 0.61–1.24)
GFR, Estimated: 47 mL/min — ABNORMAL LOW (ref >60)
Glucose, Bld: 121 mg/dL — ABNORMAL HIGH (ref 70–99)
Potassium: 4.1 mmol/L (ref 3.5–5.1)
Sodium: 136 mmol/L (ref 135–145)
Total Bilirubin: 0.9 mg/dL (ref 0.3–1.2)
Total Protein: 6.4 g/dL — ABNORMAL LOW (ref 6.5–8.1)

## 2020-09-06 LAB — GLUCOSE, CAPILLARY
Glucose-Capillary: 166 mg/dL — ABNORMAL HIGH (ref 70–99)
Glucose-Capillary: 148 mg/dL — ABNORMAL HIGH (ref 70–99)
Glucose-Capillary: 202 mg/dL — ABNORMAL HIGH (ref 70–99)
Glucose-Capillary: 206 mg/dL — ABNORMAL HIGH (ref 70–99)

## 2020-09-06 LAB — D-DIMER, QUANTITATIVE: D-Dimer, Quant: 4.72 ug/mL-FEU — ABNORMAL HIGH (ref 0.00–0.50)

## 2020-09-06 LAB — MAGNESIUM: Magnesium: 2.4 mg/dL (ref 1.7–2.4)

## 2020-09-06 LAB — BRAIN NATRIURETIC PEPTIDE: B Natriuretic Peptide: 35.6 pg/mL (ref 0.0–100.0)

## 2020-09-06 LAB — C-REACTIVE PROTEIN: CRP: 11.4 mg/dL — ABNORMAL HIGH (ref <1.0)

## 2020-09-06 NOTE — Progress Notes (Addendum)
PROGRESS NOTE                                                                                                                                                                                                             Patient Demographics:    Robert [name], is a 64 y.o. male, DOB - 12/31/1955, LEX:[id number]  Outpatient Primary MD for the patient is Doree Albee, MD   Admit date - 08/24/2020   LOS - 7  Chief Complaint  Patient presents with  . hxpoxia       Brief Narrative: Patient is a 64 y.o. male with PMHx of CKD stage IIIb, DM-2, HTN, morbid obesity, BPH-who was admitted by PCCM on 11/28 due to severe hypoxic respiratory failure due to COVID-19 pneumonia.   Patient apparently had attended a funeral-where he apparently may have been exposed to COVID-19 infection.  COVID-19 vaccinated status: Vaccinated (second Pfizer vaccine dose> 6 months back-no booster yet)  Significant Events: 11/28>> Admit to [facility] for severe hypoxia due to COVID-19 pneumonia  Significant studies: 11/28>>Chest x-ray: Bilateral airspace opacities 11/29>> Echo: EF 44-96%, grade 1 diastolic dysfunction. 11/30>> chest x-ray: Diffuse bilateral pulmonary infiltrates. 12/1>> chest x-ray: Progressive multifocal pulmonary infiltrates.  COVID-19 medications: Steroids: 11/28>> Remdesivir: 11/28>> 12/2 Baricitinib: 11/28>>  Antibiotics: Rocephin: 11/28 x 1 Zithromax: 11/28 x 1  Microbiology data: 11/28 >>blood culture: No growth  Procedures: None  Consults: None  DVT prophylaxis: Prophylactic Lovenox    Subjective:   Patient in bed, appears comfortable, denies any headache, no fever, no chest pain or pressure, improved shortness of breath , no abdominal pain. No focal weakness.   Assessment  & Plan :   Acute Hypoxic Resp Failure due to Covid 19 Viral pneumonia: he has moderate to severe parenchymal lung injury with breakthrough  infection, he received 2 doses of Pfizer vaccine and the second shot was 6 months ago.  Had not received his third shot.  Is been treated with combination of Steroids, Remdesivir and Baricitinib.  Is now showing signs of recovery.  Monitor closely.  Encouraged to sit up in chair use I-S and flutter valve for pulmonary toiletry.  O2 requirements:  SpO2: 97 % O2 Flow Rate (L/min): 15 L/min FiO2 (%): 100 %   Prone/Incentive Spirometry: encouraged patient to lie prone for 3-4 hours at a time for a total  of 16 hours a day, and to encourage incentive spirometry use 3-4/hour.   Recent Labs  Lab 08/15/2020 1041 08/25/2020 1050 08/22/2020 1054 08/15/2020 1238 08/29/2020 2032 09/02/20 0442 09/03/20 0147 09/04/20 0452 09/05/20 0243 09/06/20 0504  WBC  --  8.5  --   --    < > 15.9* 17.4* 15.3* 12.9* 15.4*  HGB  --  14.1  --   --    < > 12.8* 13.7 14.7 14.2 14.6  HCT  --  44.4  --   --    < > 39.1 41.3 43.9 41.6 44.4  PLT  --  168  --   --    < > 251 272 296 295 336  CRP  --  20.1*  --   --    < > 4.0* 1.7* 1.2* 9.8* 11.4*  BNP  --   --  104.0*  --   --   --   --   --  45.6 35.6  DDIMER  --  1.18*  --   --    < > 1.41* 2.68* 3.89* 3.66* 4.72*  PROCALCITON  --  <0.10  --   --   --   --   --   --   --   --   AST  --  43*  --   --    < > 28 27 29 25 20   ALT  --  26  --   --    < > 30 28 29 24 23   ALKPHOS  --  72  --   --    < > 55 54 61 53 57  BILITOT  --  0.8  --   --    < > 0.7 0.6 0.7 0.6 0.9  ALBUMIN  --  3.5  --   --    < > 2.7* 2.6* 2.6* 2.4* 2.3*  LATICACIDVEN  --  2.4*  --  1.7  --   --   --   --   --   --   SARSCOV2NAA POSITIVE*  --   --   --   --   --   --   --   --   --    < > = values in this interval not displayed.     Elevated D-dimer: Secondary to COVID-19 related infection- -ve Doppler.  Switched him to therapeutic Lovenox on 09/05/2020 regardless as his D-dimer is creeping higher and he is extremely high risk for developing a DVT.  Will continue till D-dimer persistently stays below  2.  AKI on CKD stage IIIb: AKI likely hemodynamically mediated-improving with supportive care.  Follow electrolytes closely. Baseline creatinine seems to be close to 1.7.  HTN: BP controlled-continue amlodipine.  HLD: Continue statin  BPH: Continue Flomax  Presumed OSA: On HFNC - we will need outpatient sleep study.  Morbid obesity - BMI 50, follow with PCP.  DM-2 (A1c 7.9 on 11/28) with uncontrolled hyperglycemia due to steroids: CBGs improving-continue Levemir 30 units twice daily, 10 units of NovoLog with meals and resistant SSI.  Follow and adjust.  Recent Labs    09/05/20 1650 09/05/20 2038 09/06/20 0822  GLUCAP 228* 227* 166*      GI prophylaxis: PPI  Condition - Extremely Guarded  Family Communication  :  Previous MD  - Spouse Briscoe Burns [phone])  on 09/21/20, 09/06/20   Code Status :  Full Code  Diet :  Diet Order  Diet Carb Modified Fluid consistency: Thin; Room service appropriate? Yes  Diet effective now                  Disposition Plan  :  Status is: Inpatient  Remains inpatient appropriate because:Inpatient level of care appropriate due to severity of illness  Dispo: The patient is from: Home              Anticipated d/c is to: Home              Anticipated d/c date is: > 3 days              Patient currently is not medically stable to d/c.   Barriers to discharge: Hypoxia requiring O2 supplementation/complete 5 days of IV Remdesivir  Antimicorbials  :    Anti-infectives (From admission, onward)   Start     Dose/Rate Route Frequency Ordered Stop   08/31/20 1000  remdesivir 100 mg in sodium chloride 0.9 % 100 mL IVPB  Status:  Discontinued       "Followed by" Linked Group Details   100 mg 200 mL/hr over 30 Minutes Intravenous Daily 08/12/2020 2023 08/03/2020 2113   08/31/20 1000  remdesivir 100 mg in sodium chloride 0.9 % 100 mL IVPB        100 mg 200 mL/hr over 30 Minutes Intravenous Daily 08/18/2020 1428 09/03/20 1057    09/01/2020 2022  remdesivir 200 mg in sodium chloride 0.9% 250 mL IVPB  Status:  Discontinued       "Followed by" Linked Group Details   200 mg 580 mL/hr over 30 Minutes Intravenous Once 08/22/2020 2023 08/22/2020 2113   08/12/2020 1430  remdesivir 100 mg in sodium chloride 0.9 % 100 mL IVPB        100 mg 200 mL/hr over 30 Minutes Intravenous Every 30 min 08/14/2020 1428 08/23/2020 1639   08/09/2020 1145  cefTRIAXone (ROCEPHIN) 2 g in sodium chloride 0.9 % 100 mL IVPB        2 g 200 mL/hr over 30 Minutes Intravenous  Once 08/29/2020 1136 08/10/2020 1409   08/09/2020 1145  azithromycin (ZITHROMAX) 500 mg in sodium chloride 0.9 % 250 mL IVPB  Status:  Discontinued        500 mg 250 mL/hr over 60 Minutes Intravenous Every 24 hours 08/18/2020 1136 08/31/20 0853      Inpatient Medications  Scheduled Meds: . amLODipine  10 mg Oral Daily  . vitamin C  500 mg Oral Daily  . baricitinib  2 mg Oral Daily  . Chlorhexidine Gluconate Cloth  6 each Topical Daily  . docusate sodium  100 mg Oral BID  . enoxaparin (LOVENOX) injection  1 mg/kg Subcutaneous Q12H  . famotidine  20 mg Oral BID  . influenza vac split quadrivalent PF  0.5 mL Intramuscular Tomorrow-1000  . insulin aspart  0-20 Units Subcutaneous TID WC  . insulin aspart  0-5 Units Subcutaneous QHS  . insulin aspart  10 Units Subcutaneous TID WC  . insulin detemir  30 Units Subcutaneous BID  . pantoprazole  40 mg Oral Q1200  . predniSONE  50 mg Oral Daily  . rosuvastatin  40 mg Oral QHS  . tamsulosin  0.4 mg Oral Daily  . zinc sulfate  220 mg Oral Daily   Continuous Infusions:  PRN Meds:.[DISCONTINUED] ondansetron **OR** ondansetron (ZOFRAN) IV, polyethylene glycol, sodium chloride   Time Spent in minutes  35   See all Orders from today for further details  Lala Lund M.D on 09/06/2020 at 8:51 AM  To page go to [email or web address] - use universal password  [facility] -  Office  [phone]    Objective:   Vitals:   09/05/20 2039  09/06/20 0458 09/06/20 0520 09/06/20 0810  BP: 118/68 (!) 158/69  127/77  Pulse: 100 (!) 106  (!) 110  Resp: (!) 24 20  20   Temp: 98.7 F (37.1 C) 98.1 F (36.7 C)  99.1 F (37.3 C)  TempSrc: Oral Oral  Oral  SpO2: 93% 92%  97%  Weight:   (!) 172.2 kg   Height:        Wt Readings from Last 3 Encounters:  09/06/20 (!) 172.2 kg  07/16/20 (!) 184 kg  04/28/20 (!) 180.1 kg     Intake/Output Summary (Last 24 hours) at 09/06/2020 0851 Last data filed at 09/06/2020 0842 Gross per 24 hour  Intake 720 ml  Output 2100 ml  Net -1380 ml     Physical Exam  Awake Alert, No new F.N deficits, Normal affect [place].AT,PERRAL Supple Neck,No JVD, No cervical lymphadenopathy appriciated.  Symmetrical Chest wall movement, Good air movement bilaterally, CTAB RRR,No Gallops, Rubs or new Murmurs, No Parasternal Heave +ve B.Sounds, Abd Soft, No tenderness, No organomegaly appriciated, No rebound - guarding or rigidity. No Cyanosis, Clubbing or edema, No new Rash or bruise    Data Review:    CBC Recent Labs  Lab 09/01/20 0744 09/01/20 0744 09/02/20 0442 09/03/20 0147 09/04/20 0452 09/05/20 0243 09/06/20 0504  WBC 14.4*   < > 15.9* 17.4* 15.3* 12.9* 15.4*  HGB 13.0   < > 12.8* 13.7 14.7 14.2 14.6  HCT 40.0   < > 39.1 41.3 43.9 41.6 44.4  PLT 231   < > 251 272 296 295 336  MCV 86.0   < > 86.1 85.0 84.4 84.7 84.7  MCH 28.0   < > 28.2 28.2 28.3 28.9 27.9  MCHC 32.5   < > 32.7 33.2 33.5 34.1 32.9  RDW 13.3   < > 13.1 12.6 12.6 12.6 12.5  LYMPHSABS 1.6  --  1.3 1.0 1.5  --  1.3  MONOABS 0.5  --  0.7 0.7 0.8  --  0.5  EOSABS 0.0  --  0.0 0.0 0.0  --  0.2  BASOSABS 0.0  --  0.0 0.0 0.0  --  0.0   < > = values in this interval not displayed.    Chemistries  Recent Labs  Lab 08/31/20 0214 08/31/20 2053 09/02/20 0442 09/03/20 0147 09/04/20 0452 09/05/20 0243 09/06/20 0504  NA 134*   < > 133* 138 137 136 136  K 4.0   < > 4.8 4.6 4.3 4.2 4.1  CL 99   < > 99 102 101 100 99  CO2 21*    < > 23 26 25 28 27   GLUCOSE 439*   < > 419* 131* 156* 123* 121*  BUN 23   < > 47* 45* 34* 29* 30*  CREATININE 2.06*   < > 1.94* 1.97* 1.65* 1.76* 1.65*  CALCIUM 8.6*   < > 8.4* 8.8* 8.6* 8.4* 8.5*  MG 2.0  --   --   --   --   --  2.4  AST 41   < > 28 27 29 25 20   ALT 27   < > 30 28 29 24 23   ALKPHOS 57   < > 55 54 61 53 57  BILITOT 1.1   < >  0.7 0.6 0.7 0.6 0.9   < > = values in this interval not displayed.   ------------------------------------------------------------------------------------------------------------------ No results for input(s): CHOL, HDL, LDLCALC, TRIG, CHOLHDL, LDLDIRECT in the last 72 hours.  Lab Results  Component Value Date   HGBA1C 7.9 (H) 08/20/2020   ------------------------------------------------------------------------------------------------------------------ No results for input(s): TSH, T4TOTAL, T3FREE, THYROIDAB in the last 72 hours.  Invalid input(s): FREET3 ------------------------------------------------------------------------------------------------------------------ Recent Labs    09/04/20 0452  FERRITIN 579*    Coagulation profile No results for input(s): INR, PROTIME in the last 168 hours.  Recent Labs    09/05/20 0243 09/06/20 0504  DDIMER 3.66* 4.72*    Cardiac Enzymes No results for input(s): CKMB, TROPONINI, MYOGLOBIN in the last 168 hours.  Invalid input(s): CK ------------------------------------------------------------------------------------------------------------------    Component Value Date/Time   BNP 35.6 09/06/2020 0504    Micro Results Recent Results (from the past 240 hour(s))  Resp Panel by RT-PCR (Flu A&B, Covid) Nasopharyngeal Swab     Status: Abnormal   Collection Time: 08/25/2020 10:41 AM   Specimen: Nasopharyngeal Swab; Nasopharyngeal(NP) swabs in vial transport medium  Result Value Ref Range Status   SARS Coronavirus 2 by RT PCR POSITIVE (A) NEGATIVE Final    Comment: RESULT CALLED TO, READ BACK  BY AND VERIFIED WITH: GANNT E. @ [id number] ON [id number] BY HENDERSON L. (NOTE) SARS-CoV-2 target nucleic acids are DETECTED.  The SARS-CoV-2 RNA is generally detectable in upper respiratory specimens during the acute phase of infection. Positive results are indicative of the presence of the identified virus, but do not rule out bacterial infection or co-infection with other pathogens not detected by the test. Clinical correlation with patient history and other diagnostic information is necessary to determine patient infection status. The expected result is Negative.  Fact Sheet for Patients: EntrepreneurPulse.com.au  Fact Sheet for Healthcare Providers: IncredibleEmployment.be  This test is not yet approved or cleared by the [place] FDA and  has been authorized for detection and/or diagnosis of SARS-CoV-2 by FDA under an Emergency Use Authorization (EUA).  This EUA will remain in effect (meaning this tes t can be used) for the duration of  the COVID-19 declaration under Section 564(b)(1) of the Act, 21 U.S.C. section 360bbb-3(b)(1), unless the authorization is terminated or revoked sooner.     Influenza A by PCR NEGATIVE NEGATIVE Final   Influenza B by PCR NEGATIVE NEGATIVE Final    Comment: (NOTE) The Xpert Xpress SARS-CoV-2/FLU/RSV plus assay is intended as an aid in the diagnosis of influenza from Nasopharyngeal swab specimens and should not be used as a sole basis for treatment. Nasal washings and aspirates are unacceptable for Xpert Xpress SARS-CoV-2/FLU/RSV testing.  Fact Sheet for Patients: EntrepreneurPulse.com.au  Fact Sheet for Healthcare Providers: IncredibleEmployment.be  This test is not yet approved or cleared by the [place] FDA and has been authorized for detection and/or diagnosis of SARS-CoV-2 by FDA under an Emergency Use Authorization (EUA). This EUA will remain in effect  (meaning this test can be used) for the duration of the COVID-19 declaration under Section 564(b)(1) of the Act, 21 U.S.C. section 360bbb-3(b)(1), unless the authorization is terminated or revoked.  Performed at [facility], [address] [place],  [address]   Blood Culture (routine x 2)     Status: None   Collection Time: 08/16/2020 10:50 AM   Specimen: BLOOD RIGHT HAND  Result Value Ref Range Status   Specimen Description   Final    BLOOD RIGHT HAND BOTTLES DRAWN AEROBIC AND ANAEROBIC  Special Requests Blood Culture adequate volume  Final   Culture   Final    NO GROWTH 5 DAYS Performed at [facility], [address], [place], [place] [address]    Report Status 09/04/2020 FINAL  Final  Blood Culture (routine x 2)     Status: None   Collection Time: 08/27/2020 10:50 AM   Specimen: BLOOD  Result Value Ref Range Status   Specimen Description BLOOD  Final   Special Requests NONE  Final   Culture   Final    NO GROWTH 5 DAYS Performed at [facility], [address], [place], [place] [address]    Report Status 09/04/2020 FINAL  Final  MRSA PCR Screening     Status: None   Collection Time: 08/22/2020  6:05 PM   Specimen: Nasopharyngeal  Result Value Ref Range Status   MRSA by PCR NEGATIVE NEGATIVE Final    Comment:        The GeneXpert MRSA Assay (FDA approved for NASAL specimens only), is one component of a comprehensive MRSA colonization surveillance program. It is not intended to diagnose MRSA infection nor to guide or monitor treatment for MRSA infections. Performed at [facility], [place] [address] [place], [place] [address]     Radiology Reports DG Chest [place] 1 View  Result Date: 09/05/2020 CLINICAL DATA:  Shortness of breath and COVID-19 positivity EXAM: PORTABLE CHEST 1 VIEW COMPARISON:  09/02/2020 FINDINGS: Cardiac shadow is stable. Diffuse bilateral airspace opacities are again identified slightly improved when compared with the prior exam. No new focal  abnormality is noted. No bony abnormality is seen. IMPRESSION: Slight improvement in bilateral airspace disease as described. Electronically Signed   By: Inez Catalina M.D.   On: 09/05/2020 09:11   DG Chest Port 1 View  Result Date: 09/02/2020 CLINICAL DATA:  Respiratory failure EXAM: PORTABLE CHEST 1 VIEW COMPARISON:  09/01/2020 FINDINGS: The lungs are symmetrically expanded. There is interval progression of asymmetric mid and lower lung zone pulmonary infiltrate, likely infectious or inflammatory in nature. No pneumothorax or pleural effusion. Cardiac size within normal limits. The pulmonary vascularity is normal. IMPRESSION: Progressive multifocal pulmonary infiltrate, likely infectious or inflammatory. Electronically Signed   By: Fidela Salisbury MD   On: 09/02/2020 06:28   DG Chest Port 1 View  Result Date: 08/09/2020 CLINICAL DATA:  Shortness of breath. EXAM: PORTABLE CHEST 1 VIEW COMPARISON:  None. FINDINGS: Cardiomediastinal silhouette is accentuated by low lung volumes and AP portable technique. Low lung volumes with bilateral peripheral predominant airspace opacities. No visible pleural effusions or pneumothorax. No acute osseous abnormality. IMPRESSION: Low lung volumes with bilateral peripheral predominant airspace opacities, concerning for multifocal pneumonia. Recommend correlation with COVID testing. Electronically Signed   By: Margaretha Sheffield MD   On: 08/16/2020 11:59   DG Chest Port 1V same Day  Result Date: 09/01/2020 CLINICAL DATA:  Respiratory failure. Shortness of breath. History of COVID. EXAM: PORTABLE CHEST 1 VIEW COMPARISON:  08/17/2020. FINDINGS: Cardiomegaly. Diffuse bilateral pulmonary infiltrates/edema again noted. Low lung volumes. No pleural effusion or pneumothorax. No acute bony abnormality. IMPRESSION: Cardiomegaly. Diffuse bilateral pulmonary infiltrates/edema again noted. Congestive heart failure and or bilateral pneumonia could present in this fashion. Low lung  volumes. Electronically Signed   By: Marcello Moores  Register   On: 09/01/2020 09:29   VAS [place] LOWER EXTREMITY VENOUS (DVT)  Result Date: 09/05/2020  Lower Venous DVT Study Indications: Elevated d-dimer, Covid.  Comparison Study: No prior studies. Performing Technologist: Darlin Coco, RDMS  Examination  Guidelines: A complete evaluation includes B-mode imaging, spectral Doppler, color Doppler, and power Doppler as needed of all accessible portions of each vessel. Bilateral testing is considered an integral part of a complete examination. Limited examinations for reoccurring indications may be performed as noted. The reflux portion of the exam is performed with the patient in reverse Trendelenburg.  +---------+---------------+---------+-----------+----------+--------------+ RIGHT    CompressibilityPhasicitySpontaneityPropertiesThrombus Aging +---------+---------------+---------+-----------+----------+--------------+ CFV      Full           Yes      Yes                                 +---------+---------------+---------+-----------+----------+--------------+ SFJ      Full                                                        +---------+---------------+---------+-----------+----------+--------------+ FV Prox  Full                                                        +---------+---------------+---------+-----------+----------+--------------+ FV Mid   Full                                                        +---------+---------------+---------+-----------+----------+--------------+ FV DistalFull                                                        +---------+---------------+---------+-----------+----------+--------------+ PFV      Full                                                        +---------+---------------+---------+-----------+----------+--------------+ POP      Full           Yes      Yes                                  +---------+---------------+---------+-----------+----------+--------------+ PTV      Full                                                        +---------+---------------+---------+-----------+----------+--------------+ PERO     Full                                                        +---------+---------------+---------+-----------+----------+--------------+   +---------+---------------+---------+-----------+----------+--------------+  LEFT     CompressibilityPhasicitySpontaneityPropertiesThrombus Aging +---------+---------------+---------+-----------+----------+--------------+ CFV      Full           Yes      Yes                                 +---------+---------------+---------+-----------+----------+--------------+ SFJ      Full                                                        +---------+---------------+---------+-----------+----------+--------------+ FV Prox  Full                                                        +---------+---------------+---------+-----------+----------+--------------+ FV Mid   Full                                                        +---------+---------------+---------+-----------+----------+--------------+ FV DistalFull                                                        +---------+---------------+---------+-----------+----------+--------------+ PFV      Full                                                        +---------+---------------+---------+-----------+----------+--------------+ POP      Full           Yes      Yes                                 +---------+---------------+---------+-----------+----------+--------------+ PTV      Full                                                        +---------+---------------+---------+-----------+----------+--------------+ PERO     Full                                                         +---------+---------------+---------+-----------+----------+--------------+     Summary: RIGHT: - There is no evidence of deep vein thrombosis in the lower extremity.  - No cystic structure found in the popliteal fossa.  LEFT: - There is no evidence of deep vein thrombosis in the lower extremity.  - No  cystic structure found in the popliteal fossa.  *See table(s) above for measurements and observations. Electronically signed by Harold Barban MD on 09/05/2020 at 5:31:25 PM.    Final    ECHOCARDIOGRAM LIMITED  Result Date: 08/31/2020    ECHOCARDIOGRAM LIMITED REPORT   Patient Name:   CRANDALL HARVEL Date of Exam: 08/31/2020 Medical Rec #:  [id number]       Height:       73.0 in Accession #:    [id number]      Weight:       364.2 lb Date of Birth:  1956/07/12       BSA:          2.776 m Patient Age:    89 years        BP:           142/86 mmHg Patient Gender: M               HR:           101 bpm. Exam Location:  Inpatient Procedure: Limited Echo, Cardiac Doppler, Color Doppler and Intracardiac            Opacification Agent Indications:    Chest Pain 786.50/ R07.9  History:        Patient has no prior history of Echocardiogram examinations.                 Risk Factors:Hypertension, Diabetes and Non-Smoker. GERD.  Sonographer:    Vickie Epley RDCS Referring Phys: [id number] [facility] CHAND  Sonographer Comments: Covid positive. IMPRESSIONS  1. Left ventricular ejection fraction, by estimation, is 65 to 70%. The left ventricle has normal function. The left ventricle has no regional wall motion abnormalities. There is mild left ventricular hypertrophy. Left ventricular diastolic parameters are consistent with Grade I diastolic dysfunction (impaired relaxation).  2. Right ventricule is not well visualized but grossly normal size and systolic function. Tricuspid regurgitation signal is inadequate for assessing PA pressure.  3. The mitral valve is normal in structure. No evidence of mitral valve regurgitation.  4. The aortic  valve is tricuspid. Aortic valve regurgitation is not visualized. No aortic stenosis is present.  5. Aortic dilatation noted. There is mild dilatation of the ascending aorta, measuring 39 mm. FINDINGS  Left Ventricle: Left ventricular ejection fraction, by estimation, is 65 to 70%. The left ventricle has normal function. The left ventricle has no regional wall motion abnormalities. Definity contrast agent was given IV to delineate the left ventricular  endocardial borders. The left ventricular internal cavity size was normal in size. There is mild left ventricular hypertrophy. Left ventricular diastolic parameters are consistent with Grade I diastolic dysfunction (impaired relaxation). Right Ventricle: The right ventricular size is not well visualized. Right ventricular systolic function was not well visualized. Tricuspid regurgitation signal is inadequate for assessing PA pressure. Left Atrium: Left atrial size was normal in size. Pericardium: There is no evidence of pericardial effusion. Mitral Valve: The mitral valve is normal in structure. Tricuspid Valve: The tricuspid valve is normal in structure. Tricuspid valve regurgitation is not demonstrated. Aortic Valve: The aortic valve is tricuspid. Aortic valve regurgitation is not visualized. No aortic stenosis is present. Pulmonic Valve: The pulmonic valve was not well visualized. Pulmonic valve regurgitation is not visualized. Aorta: The aortic root is normal in size and structure and aortic dilatation noted. There is mild dilatation of the ascending aorta, measuring 39 mm. IAS/Shunts: The interatrial septum was not well visualized. LEFT VENTRICLE PLAX 2D  LVIDd:         4.60 cm      Diastology LVIDs:         3.40 cm      LV e' medial:    6.42 cm/s LV PW:         1.10 cm      LV E/e' medial:  11.2 LV IVS:        1.10 cm      LV e' lateral:   9.25 cm/s LVOT diam:     2.60 cm      LV E/e' lateral: 7.7 LV SV:         106 LV SV Index:   38 LVOT Area:     5.31 cm  LV  Volumes (MOD) LV vol d, MOD A2C: 91.9 ml LV vol d, MOD A4C: 116.0 ml LV vol s, MOD A2C: 21.5 ml LV vol s, MOD A4C: 37.8 ml LV SV MOD A2C:     70.4 ml LV SV MOD A4C:     116.0 ml LV SV MOD BP:      72.2 ml LEFT ATRIUM           Index LA diam:      4.10 cm 1.48 cm/m LA Vol (A4C): 46.1 ml 16.61 ml/m  AORTIC VALVE LVOT Vmax:   108.00 cm/s LVOT Vmean:  70.200 cm/s LVOT VTI:    0.199 m  AORTA Ao Root diam: 3.80 cm Ao Asc diam:  3.90 cm MITRAL VALVE MV Area (PHT): 5.62 cm    SHUNTS MV Decel Time: 135 msec    Systemic VTI:  0.20 m MV E velocity: 71.60 cm/s  Systemic Diam: 2.60 cm MV A velocity: 90.00 cm/s MV E/A ratio:  0.80 Oswaldo Milian MD Electronically signed by Oswaldo Milian MD Signature Date/Time: 08/31/2020/12:03:23 PM    Final

## 2020-09-06 NOTE — Progress Notes (Signed)
   09/06/20 1311  Assess: MEWS Score  Temp 99.1 F (37.3 C)  BP 128/73  Pulse Rate (!) 102  ECG Heart Rate (!) 103  Resp (!) 35  SpO2 91 %  Assess: MEWS Score  MEWS Temp 0  MEWS Systolic 0  MEWS Pulse 1  MEWS RR 2  MEWS LOC 0  MEWS Score 3  MEWS Score Color Yellow  Assess: if the MEWS score is Yellow or Red  Were vital signs taken at a resting state? Yes  Focused Assessment No change from prior assessment  Early Detection of Sepsis Score *See Row Information* Low  MEWS guidelines implemented *See Row Information* Yes  Treat  MEWS Interventions Administered scheduled meds/treatments  Pain Scale 0-10  Pain Score 0  Take Vital Signs  Increase Vital Sign Frequency  Yellow: Q 2hr X 2 then Q 4hr X 2, if remains yellow, continue Q 4hrs  Escalate  MEWS: Escalate Yellow: discuss with charge nurse/RN and consider discussing with provider and RRT  Notify: Charge Nurse/RN  Name of Charge Nurse/RN Notified Lanny Hurst  Date Charge Nurse/RN Notified 09/06/20  Time Charge Nurse/RN Notified 1320  Notify: Provider  Provider Name/Title Candiss Norse  Date Provider Notified 09/06/20  Time Provider Notified [id number]  Notification Type Page  Notification Reason Other (Comment) (MEWS per protocol)  Response No new orders  Date of Provider Response 09/06/20  Time of Provider Response 1340

## 2020-09-07 ENCOUNTER — Inpatient Hospital Stay (HOSPITAL_COMMUNITY): Payer: 59

## 2020-09-07 DIAGNOSIS — J9600 Acute respiratory failure, unspecified whether with hypoxia or hypercapnia: Secondary | ICD-10-CM | POA: Diagnosis not present

## 2020-09-07 DIAGNOSIS — U071 COVID-19: Secondary | ICD-10-CM | POA: Diagnosis not present

## 2020-09-07 LAB — CBC WITH DIFFERENTIAL/PLATELET
Abs Immature Granulocytes: 0.25 10*3/uL — ABNORMAL HIGH (ref 0.00–0.07)
Basophils Absolute: 0 10*3/uL (ref 0.0–0.1)
Basophils Relative: 0 %
Eosinophils Absolute: 0.1 10*3/uL (ref 0.0–0.5)
Eosinophils Relative: 1 %
HCT: 43.7 % (ref 39.0–52.0)
Hemoglobin: 14.4 g/dL (ref 13.0–17.0)
Immature Granulocytes: 1 %
Lymphocytes Relative: 5 %
Lymphs Abs: 0.9 10*3/uL (ref 0.7–4.0)
MCH: 28.3 pg (ref 26.0–34.0)
MCHC: 33 g/dL (ref 30.0–36.0)
MCV: 86 fL (ref 80.0–100.0)
Monocytes Absolute: 0.8 10*3/uL (ref 0.1–1.0)
Monocytes Relative: 4 %
Neutro Abs: 16.2 10*3/uL — ABNORMAL HIGH (ref 1.7–7.7)
Neutrophils Relative %: 89 %
Platelets: 465 10*3/uL — ABNORMAL HIGH (ref 150–400)
RBC: 5.08 MIL/uL (ref 4.22–5.81)
RDW: 12.8 % (ref 11.5–15.5)
WBC: 18.3 10*3/uL — ABNORMAL HIGH (ref 4.0–10.5)
nRBC: 0 % (ref 0.0–0.2)

## 2020-09-07 LAB — C-REACTIVE PROTEIN: CRP: 11.3 mg/dL — ABNORMAL HIGH (ref <1.0)

## 2020-09-07 LAB — COMPREHENSIVE METABOLIC PANEL
ALT: 24 U/L (ref 0–44)
AST: 25 U/L (ref 15–41)
Albumin: 2.4 g/dL — ABNORMAL LOW (ref 3.5–5.0)
Alkaline Phosphatase: 60 U/L (ref 38–126)
Anion gap: 10 (ref 5–15)
BUN: 28 mg/dL — ABNORMAL HIGH (ref 8–23)
CO2: 23 mmol/L (ref 22–32)
Calcium: 8.3 mg/dL — ABNORMAL LOW (ref 8.9–10.3)
Chloride: 98 mmol/L (ref 98–111)
Creatinine, Ser: 1.6 mg/dL — ABNORMAL HIGH (ref 0.61–1.24)
GFR, Estimated: 48 mL/min — ABNORMAL LOW (ref >60)
Glucose, Bld: 200 mg/dL — ABNORMAL HIGH (ref 70–99)
Potassium: 4.3 mmol/L (ref 3.5–5.1)
Sodium: 131 mmol/L — ABNORMAL LOW (ref 135–145)
Total Bilirubin: 0.8 mg/dL (ref 0.3–1.2)
Total Protein: 6.7 g/dL (ref 6.5–8.1)

## 2020-09-07 LAB — GLUCOSE, CAPILLARY
Glucose-Capillary: 133 mg/dL — ABNORMAL HIGH (ref 70–99)
Glucose-Capillary: 228 mg/dL — ABNORMAL HIGH (ref 70–99)
Glucose-Capillary: 337 mg/dL — ABNORMAL HIGH (ref 70–99)
Glucose-Capillary: 195 mg/dL — ABNORMAL HIGH (ref 70–99)

## 2020-09-07 LAB — D-DIMER, QUANTITATIVE: D-Dimer, Quant: 3.56 ug/mL-FEU — ABNORMAL HIGH (ref 0.00–0.50)

## 2020-09-07 LAB — BRAIN NATRIURETIC PEPTIDE: B Natriuretic Peptide: 43.2 pg/mL (ref 0.0–100.0)

## 2020-09-07 LAB — MAGNESIUM: Magnesium: 2.5 mg/dL — ABNORMAL HIGH (ref 1.7–2.4)

## 2020-09-07 NOTE — Progress Notes (Signed)
PROGRESS NOTE                                                                                                                                                                                                             Patient Demographics:    Robert [name], is a 64 y.o. male, DOB - 1955/12/22, GOT:[id number]  Outpatient Primary MD for the patient is Doree Albee, MD   Admit date - 08/24/2020   LOS - 8  Chief Complaint  Patient presents with  . hxpoxia       Brief Narrative: Patient is a 64 y.o. male with PMHx of CKD stage IIIb, DM-2, HTN, morbid obesity, BPH-who was admitted by PCCM on 11/28 due to severe hypoxic respiratory failure due to COVID-19 pneumonia.   Patient apparently had attended a funeral-where he apparently may have been exposed to COVID-19 infection.  COVID-19 vaccinated status: Vaccinated (second Pfizer vaccine dose> 6 months back-no booster yet)  Significant Events: 11/28>> Admit to [facility] for severe hypoxia due to COVID-19 pneumonia  Significant studies: 11/28>>Chest x-ray: Bilateral airspace opacities 11/29>> Echo: EF 59-74%, grade 1 diastolic dysfunction. 11/30>> chest x-ray: Diffuse bilateral pulmonary infiltrates. 12/1>> chest x-ray: Progressive multifocal pulmonary infiltrates.  COVID-19 medications: Steroids: 11/28>> Remdesivir: 11/28>> 12/2 Baricitinib: 11/28>>  Antibiotics: Rocephin: 11/28 x 1 Zithromax: 11/28 x 1  Microbiology data: 11/28 >>blood culture: No growth  Procedures: None  Consults: None  DVT prophylaxis: Prophylactic Lovenox    Subjective:   Patient in bed denies any headache, no chest or abdominal pain, improving shortness of breath.   Assessment  & Plan :   Acute Hypoxic Resp Failure due to Covid 19 Viral pneumonia: he has moderate to severe parenchymal lung injury with breakthrough infection, he received 2 doses of Pfizer vaccine and the second shot  was 6 months ago.  Had not received his third shot.  Is been treated with combination of Steroids, Remdesivir and Baricitinib.  Is now showing signs of recovery.  Monitor closely.  Encouraged to sit up in chair use I-S and flutter valve for pulmonary toiletry.  O2 requirements:  SpO2: 92 % O2 Flow Rate (L/min): 15 L/min FiO2 (%): 100 %   Prone/Incentive Spirometry: encouraged patient to lie prone for 3-4 hours at a time for a total of 16 hours a day, and to encourage incentive spirometry use  3-4/hour.   Recent Labs  Lab 09/02/20 0442 09/03/20 0147 09/04/20 0452 09/05/20 0243 09/06/20 0504  WBC 15.9* 17.4* 15.3* 12.9* 15.4*  HGB 12.8* 13.7 14.7 14.2 14.6  HCT 39.1 41.3 43.9 41.6 44.4  PLT 251 272 296 295 336  CRP 4.0* 1.7* 1.2* 9.8* 11.4*  BNP  --   --   --  45.6 35.6  DDIMER 1.41* 2.68* 3.89* 3.66* 4.72*  AST 28 27 29 25 20   ALT 30 28 29 24 23   ALKPHOS 55 54 61 53 57  BILITOT 0.7 0.6 0.7 0.6 0.9  ALBUMIN 2.7* 2.6* 2.6* 2.4* 2.3*     Elevated D-dimer: Secondary to COVID-19 related infection- -ve Doppler.  Switched him to therapeutic Lovenox on 09/05/2020 regardless as his D-dimer is creeping higher and he is extremely high risk for developing a DVT.  Will continue till D-dimer persistently stays below 2.  AKI on CKD stage IIIb: AKI likely hemodynamically mediated-improving with supportive care.  Follow electrolytes closely. Baseline creatinine seems to be close to 1.7.  Mild incidental ascending aortic dilation on echocardiogram.  Follow with PCP and cardiology in 2 to 3 weeks post discharge.    HTN: BP controlled-continue amlodipine.  HLD: Continue statin  BPH: Continue Flomax  Presumed OSA: On HFNC - we will need outpatient sleep study.  Morbid obesity - BMI 50, follow with PCP.  DM-2 (A1c 7.9 on 11/28) with uncontrolled hyperglycemia due to steroids: CBGs improving-continue Levemir 30 units twice daily, 10 units of NovoLog with meals and resistant SSI.  Follow and  adjust.  Recent Labs    09/05/20 1650 09/05/20 2038 09/06/20 0822  GLUCAP 228* 227* 166*      GI prophylaxis: PPI  Condition - Extremely Guarded  Family Communication  :  Previous MD  - Spouse Briscoe Burns [phone])  on 09/03/20, 09/06/20   Code Status :  Full Code  Diet :  Diet Order            Diet Carb Modified Fluid consistency: Thin; Room service appropriate? Yes  Diet effective now                  Disposition Plan  :  Status is: Inpatient  Remains inpatient appropriate because:Inpatient level of care appropriate due to severity of illness  Dispo: The patient is from: Home              Anticipated d/c is to: Home              Anticipated d/c date is: > 3 days              Patient currently is not medically stable to d/c.   Barriers to discharge: Hypoxia requiring O2 supplementation/complete 5 days of IV Remdesivir  Antimicorbials  :    Anti-infectives (From admission, onward)   Start     Dose/Rate Route Frequency Ordered Stop   08/31/20 1000  remdesivir 100 mg in sodium chloride 0.9 % 100 mL IVPB  Status:  Discontinued       "Followed by" Linked Group Details   100 mg 200 mL/hr over 30 Minutes Intravenous Daily 08/09/2020 2023 08/24/2020 2113   08/31/20 1000  remdesivir 100 mg in sodium chloride 0.9 % 100 mL IVPB        100 mg 200 mL/hr over 30 Minutes Intravenous Daily 08/29/2020 1428 09/03/20 1057   08/07/2020 2022  remdesivir 200 mg in sodium chloride 0.9% 250 mL IVPB  Status:  Discontinued       "  Followed by" Linked Group Details   200 mg 580 mL/hr over 30 Minutes Intravenous Once 08/17/2020 2023 08/24/2020 2113   08/04/2020 1430  remdesivir 100 mg in sodium chloride 0.9 % 100 mL IVPB        100 mg 200 mL/hr over 30 Minutes Intravenous Every 30 min 08/26/2020 1428 08/08/2020 1639   08/06/2020 1145  cefTRIAXone (ROCEPHIN) 2 g in sodium chloride 0.9 % 100 mL IVPB        2 g 200 mL/hr over 30 Minutes Intravenous  Once 08/22/2020 1136 08/20/2020 1409   08/23/2020 1145   azithromycin (ZITHROMAX) 500 mg in sodium chloride 0.9 % 250 mL IVPB  Status:  Discontinued        500 mg 250 mL/hr over 60 Minutes Intravenous Every 24 hours 08/29/2020 1136 08/31/20 0853      Inpatient Medications  Scheduled Meds: . amLODipine  10 mg Oral Daily  . vitamin C  500 mg Oral Daily  . baricitinib  2 mg Oral Daily  . Chlorhexidine Gluconate Cloth  6 each Topical Daily  . docusate sodium  100 mg Oral BID  . enoxaparin (LOVENOX) injection  1 mg/kg Subcutaneous Q12H  . famotidine  20 mg Oral BID  . influenza vac split quadrivalent PF  0.5 mL Intramuscular Tomorrow-1000  . insulin aspart  0-20 Units Subcutaneous TID WC  . insulin aspart  0-5 Units Subcutaneous QHS  . insulin aspart  10 Units Subcutaneous TID WC  . insulin detemir  30 Units Subcutaneous BID  . pantoprazole  40 mg Oral Q1200  . predniSONE  50 mg Oral Daily  . rosuvastatin  40 mg Oral QHS  . tamsulosin  0.4 mg Oral Daily  . zinc sulfate  220 mg Oral Daily   Continuous Infusions:  PRN Meds:.[DISCONTINUED] ondansetron **OR** ondansetron (ZOFRAN) IV, polyethylene glycol, sodium chloride   Time Spent in minutes  35   See all Orders from today for further details   Lala Lund M.D on 09/07/2020 at 8:42 AM  To page go to [email or web address] - use universal password  [facility] -  Office  [phone]    Objective:   Vitals:   09/07/20 0114 09/07/20 0533 09/07/20 0650 09/07/20 0810  BP: (!) 147/78 (!) 151/106 138/78   Pulse: 93 88 92 96  Resp: (!) 21 20 (!) 22 (!) 24  Temp: 98.7 F (37.1 C) 98.7 F (37.1 C)    TempSrc: Oral Oral    SpO2: 93% 97% 91% 92%  Weight:  (!) 172.2 kg    Height:        Wt Readings from Last 3 Encounters:  09/07/20 (!) 172.2 kg  07/16/20 (!) 184 kg  04/28/20 (!) 180.1 kg     Intake/Output Summary (Last 24 hours) at 09/07/2020 0842 Last data filed at 09/07/2020 3267 Gross per 24 hour  Intake -  Output 350 ml  Net -350 ml     Physical Exam  Awake Alert,  No new F.N deficits, Normal affect [place].AT,PERRAL Supple Neck,No JVD, No cervical lymphadenopathy appriciated.  Symmetrical Chest wall movement, Good air movement bilaterally, CTAB RRR,No Gallops, Rubs or new Murmurs, No Parasternal Heave +ve B.Sounds, Abd Soft, No tenderness, No organomegaly appriciated, No rebound - guarding or rigidity. No Cyanosis, Clubbing or edema, No new Rash or bruise     Data Review:    CBC Recent Labs  Lab 09/01/20 0744 09/01/20 0744 09/02/20 0442 09/03/20 0147 09/04/20 0452 09/05/20 0243 09/06/20 0504  WBC 14.4*   < >  15.9* 17.4* 15.3* 12.9* 15.4*  HGB 13.0   < > 12.8* 13.7 14.7 14.2 14.6  HCT 40.0   < > 39.1 41.3 43.9 41.6 44.4  PLT 231   < > 251 272 296 295 336  MCV 86.0   < > 86.1 85.0 84.4 84.7 84.7  MCH 28.0   < > 28.2 28.2 28.3 28.9 27.9  MCHC 32.5   < > 32.7 33.2 33.5 34.1 32.9  RDW 13.3   < > 13.1 12.6 12.6 12.6 12.5  LYMPHSABS 1.6  --  1.3 1.0 1.5  --  1.3  MONOABS 0.5  --  0.7 0.7 0.8  --  0.5  EOSABS 0.0  --  0.0 0.0 0.0  --  0.2  BASOSABS 0.0  --  0.0 0.0 0.0  --  0.0   < > = values in this interval not displayed.    Chemistries  Recent Labs  Lab 09/02/20 0442 09/03/20 0147 09/04/20 0452 09/05/20 0243 09/06/20 0504  NA 133* 138 137 136 136  K 4.8 4.6 4.3 4.2 4.1  CL 99 102 101 100 99  CO2 23 26 25 28 27   GLUCOSE 419* 131* 156* 123* 121*  BUN 47* 45* 34* 29* 30*  CREATININE 1.94* 1.97* 1.65* 1.76* 1.65*  CALCIUM 8.4* 8.8* 8.6* 8.4* 8.5*  MG  --   --   --   --  2.4  AST 28 27 29 25 20   ALT 30 28 29 24 23   ALKPHOS 55 54 61 53 57  BILITOT 0.7 0.6 0.7 0.6 0.9   ------------------------------------------------------------------------------------------------------------------ No results for input(s): CHOL, HDL, LDLCALC, TRIG, CHOLHDL, LDLDIRECT in the last 72 hours.  Lab Results  Component Value Date   HGBA1C 7.9 (H) 08/03/2020    ------------------------------------------------------------------------------------------------------------------ No results for input(s): TSH, T4TOTAL, T3FREE, THYROIDAB in the last 72 hours.  Invalid input(s): FREET3 ------------------------------------------------------------------------------------------------------------------ No results for input(s): VITAMINB12, FOLATE, FERRITIN, TIBC, IRON, RETICCTPCT in the last 72 hours.  Coagulation profile No results for input(s): INR, PROTIME in the last 168 hours.  Recent Labs    09/05/20 0243 09/06/20 0504  DDIMER 3.66* 4.72*    Cardiac Enzymes No results for input(s): CKMB, TROPONINI, MYOGLOBIN in the last 168 hours.  Invalid input(s): CK ------------------------------------------------------------------------------------------------------------------    Component Value Date/Time   BNP 35.6 09/06/2020 0504    Micro Results Recent Results (from the past 240 hour(s))  Resp Panel by RT-PCR (Flu A&B, Covid) Nasopharyngeal Swab     Status: Abnormal   Collection Time: 08/05/2020 10:41 AM   Specimen: Nasopharyngeal Swab; Nasopharyngeal(NP) swabs in vial transport medium  Result Value Ref Range Status   SARS Coronavirus 2 by RT PCR POSITIVE (A) NEGATIVE Final    Comment: RESULT CALLED TO, READ BACK BY AND VERIFIED WITH: GANNT E. @ [id number] ON [id number] BY HENDERSON L. (NOTE) SARS-CoV-2 target nucleic acids are DETECTED.  The SARS-CoV-2 RNA is generally detectable in upper respiratory specimens during the acute phase of infection. Positive results are indicative of the presence of the identified virus, but do not rule out bacterial infection or co-infection with other pathogens not detected by the test. Clinical correlation with patient history and other diagnostic information is necessary to determine patient infection status. The expected result is Negative.  Fact Sheet for Patients: EntrepreneurPulse.com.au  Fact  Sheet for Healthcare Providers: IncredibleEmployment.be  This test is not yet approved or cleared by the [place] FDA and  has been authorized for detection and/or diagnosis of SARS-CoV-2 by FDA  under an Emergency Use Authorization (EUA).  This EUA will remain in effect (meaning this tes t can be used) for the duration of  the COVID-19 declaration under Section 564(b)(1) of the Act, 21 U.S.C. section 360bbb-3(b)(1), unless the authorization is terminated or revoked sooner.     Influenza A by PCR NEGATIVE NEGATIVE Final   Influenza B by PCR NEGATIVE NEGATIVE Final    Comment: (NOTE) The Xpert Xpress SARS-CoV-2/FLU/RSV plus assay is intended as an aid in the diagnosis of influenza from Nasopharyngeal swab specimens and should not be used as a sole basis for treatment. Nasal washings and aspirates are unacceptable for Xpert Xpress SARS-CoV-2/FLU/RSV testing.  Fact Sheet for Patients: EntrepreneurPulse.com.au  Fact Sheet for Healthcare Providers: IncredibleEmployment.be  This test is not yet approved or cleared by the [place] FDA and has been authorized for detection and/or diagnosis of SARS-CoV-2 by FDA under an Emergency Use Authorization (EUA). This EUA will remain in effect (meaning this test can be used) for the duration of the COVID-19 declaration under Section 564(b)(1) of the Act, 21 U.S.C. section 360bbb-3(b)(1), unless the authorization is terminated or revoked.  Performed at [facility], [address] [place], [place] [address]   Blood Culture (routine x 2)     Status: None   Collection Time: 08/18/2020 10:50 AM   Specimen: BLOOD RIGHT HAND  Result Value Ref Range Status   Specimen Description   Final    BLOOD RIGHT HAND BOTTLES DRAWN AEROBIC AND ANAEROBIC   Special Requests Blood Culture adequate volume  Final   Culture   Final    NO GROWTH 5 DAYS Performed at [facility], [address]  [place], [place] [address]    Report Status 09/04/2020 FINAL  Final  Blood Culture (routine x 2)     Status: None   Collection Time: 08/05/2020 10:50 AM   Specimen: BLOOD  Result Value Ref Range Status   Specimen Description BLOOD  Final   Special Requests NONE  Final   Culture   Final    NO GROWTH 5 DAYS Performed at [facility], [address] [place], [place] [address]    Report Status 09/04/2020 FINAL  Final  MRSA PCR Screening     Status: None   Collection Time: 08/04/2020  6:05 PM   Specimen: Nasopharyngeal  Result Value Ref Range Status   MRSA by PCR NEGATIVE NEGATIVE Final    Comment:        The GeneXpert MRSA Assay (FDA approved for NASAL specimens only), is one component of a comprehensive MRSA colonization surveillance program. It is not intended to diagnose MRSA infection nor to guide or monitor treatment for MRSA infections. Performed at [facility], [place] [address] [place],  [address]     Radiology Reports DG Chest [place] 1 View  Result Date: 09/05/2020 CLINICAL DATA:  Shortness of breath and COVID-19 positivity EXAM: PORTABLE CHEST 1 VIEW COMPARISON:  09/02/2020 FINDINGS: Cardiac shadow is stable. Diffuse bilateral airspace opacities are again identified slightly improved when compared with the prior exam. No new focal abnormality is noted. No bony abnormality is seen. IMPRESSION: Slight improvement in bilateral airspace disease as described. Electronically Signed   By: Inez Catalina M.D.   On: 09/05/2020 09:11   DG Chest Port 1 View  Result Date: 09/02/2020 CLINICAL DATA:  Respiratory failure EXAM: PORTABLE CHEST 1 VIEW COMPARISON:  09/01/2020 FINDINGS: The lungs are symmetrically expanded. There is interval progression of asymmetric mid and lower lung zone  pulmonary infiltrate, likely infectious or inflammatory in nature. No pneumothorax or pleural effusion. Cardiac size within normal limits. The pulmonary vascularity is normal. IMPRESSION: Progressive  multifocal pulmonary infiltrate, likely infectious or inflammatory. Electronically Signed   By: Fidela Salisbury MD   On: 09/02/2020 06:28   DG Chest Port 1 View  Result Date: 08/08/2020 CLINICAL DATA:  Shortness of breath. EXAM: PORTABLE CHEST 1 VIEW COMPARISON:  None. FINDINGS: Cardiomediastinal silhouette is accentuated by low lung volumes and AP portable technique. Low lung volumes with bilateral peripheral predominant airspace opacities. No visible pleural effusions or pneumothorax. No acute osseous abnormality. IMPRESSION: Low lung volumes with bilateral peripheral predominant airspace opacities, concerning for multifocal pneumonia. Recommend correlation with COVID testing. Electronically Signed   By: Margaretha Sheffield MD   On: 08/17/2020 11:59   DG Chest Port 1V same Day  Result Date: 09/01/2020 CLINICAL DATA:  Respiratory failure. Shortness of breath. History of COVID. EXAM: PORTABLE CHEST 1 VIEW COMPARISON:  08/04/2020. FINDINGS: Cardiomegaly. Diffuse bilateral pulmonary infiltrates/edema again noted. Low lung volumes. No pleural effusion or pneumothorax. No acute bony abnormality. IMPRESSION: Cardiomegaly. Diffuse bilateral pulmonary infiltrates/edema again noted. Congestive heart failure and or bilateral pneumonia could present in this fashion. Low lung volumes. Electronically Signed   By: Marcello Moores  Register   On: 09/01/2020 09:29   VAS [place] LOWER EXTREMITY VENOUS (DVT)  Result Date: 09/05/2020  Lower Venous DVT Study Indications: Elevated d-dimer, Covid.  Comparison Study: No prior studies. Performing Technologist: Darlin Coco, RDMS  Examination Guidelines: A complete evaluation includes B-mode imaging, spectral Doppler, color Doppler, and power Doppler as needed of all accessible portions of each vessel. Bilateral testing is considered an integral part of a complete examination. Limited examinations for reoccurring indications may be performed as noted. The reflux portion of the exam is  performed with the patient in reverse Trendelenburg.  +---------+---------------+---------+-----------+----------+--------------+ RIGHT    CompressibilityPhasicitySpontaneityPropertiesThrombus Aging +---------+---------------+---------+-----------+----------+--------------+ CFV      Full           Yes      Yes                                 +---------+---------------+---------+-----------+----------+--------------+ SFJ      Full                                                        +---------+---------------+---------+-----------+----------+--------------+ FV Prox  Full                                                        +---------+---------------+---------+-----------+----------+--------------+ FV Mid   Full                                                        +---------+---------------+---------+-----------+----------+--------------+ FV DistalFull                                                        +---------+---------------+---------+-----------+----------+--------------+  PFV      Full                                                        +---------+---------------+---------+-----------+----------+--------------+ POP      Full           Yes      Yes                                 +---------+---------------+---------+-----------+----------+--------------+ PTV      Full                                                        +---------+---------------+---------+-----------+----------+--------------+ PERO     Full                                                        +---------+---------------+---------+-----------+----------+--------------+   +---------+---------------+---------+-----------+----------+--------------+ LEFT     CompressibilityPhasicitySpontaneityPropertiesThrombus Aging +---------+---------------+---------+-----------+----------+--------------+ CFV      Full           Yes      Yes                                  +---------+---------------+---------+-----------+----------+--------------+ SFJ      Full                                                        +---------+---------------+---------+-----------+----------+--------------+ FV Prox  Full                                                        +---------+---------------+---------+-----------+----------+--------------+ FV Mid   Full                                                        +---------+---------------+---------+-----------+----------+--------------+ FV DistalFull                                                        +---------+---------------+---------+-----------+----------+--------------+ PFV      Full                                                        +---------+---------------+---------+-----------+----------+--------------+   POP      Full           Yes      Yes                                 +---------+---------------+---------+-----------+----------+--------------+ PTV      Full                                                        +---------+---------------+---------+-----------+----------+--------------+ PERO     Full                                                        +---------+---------------+---------+-----------+----------+--------------+     Summary: RIGHT: - There is no evidence of deep vein thrombosis in the lower extremity.  - No cystic structure found in the popliteal fossa.  LEFT: - There is no evidence of deep vein thrombosis in the lower extremity.  - No cystic structure found in the popliteal fossa.  *See table(s) above for measurements and observations. Electronically signed by Harold Barban MD on 09/05/2020 at 5:31:25 PM.    Final    ECHOCARDIOGRAM LIMITED  Result Date: 08/31/2020    ECHOCARDIOGRAM LIMITED REPORT   Patient Name:   JESSICA SEIDMAN Date of Exam: 08/31/2020 Medical Rec #:  [id number]       Height:       73.0 in Accession #:    [id number]      Weight:        364.2 lb Date of Birth:  Jul 08, 1956       BSA:          2.776 m Patient Age:    64 years        BP:           142/86 mmHg Patient Gender: M               HR:           101 bpm. Exam Location:  Inpatient Procedure: Limited Echo, Cardiac Doppler, Color Doppler and Intracardiac            Opacification Agent Indications:    Chest Pain 786.50/ R07.9  History:        Patient has no prior history of Echocardiogram examinations.                 Risk Factors:Hypertension, Diabetes and Non-Smoker. GERD.  Sonographer:    Vickie Epley RDCS Referring Phys: [id number] [facility] CHAND  Sonographer Comments: Covid positive. IMPRESSIONS  1. Left ventricular ejection fraction, by estimation, is 65 to 70%. The left ventricle has normal function. The left ventricle has no regional wall motion abnormalities. There is mild left ventricular hypertrophy. Left ventricular diastolic parameters are consistent with Grade I diastolic dysfunction (impaired relaxation).  2. Right ventricule is not well visualized but grossly normal size and systolic function. Tricuspid regurgitation signal is inadequate for assessing PA pressure.  3. The mitral valve is normal in structure. No evidence of mitral valve regurgitation.  4. The aortic valve is tricuspid. Aortic valve regurgitation is not visualized. No aortic  stenosis is present.  5. Aortic dilatation noted. There is mild dilatation of the ascending aorta, measuring 39 mm. FINDINGS  Left Ventricle: Left ventricular ejection fraction, by estimation, is 65 to 70%. The left ventricle has normal function. The left ventricle has no regional wall motion abnormalities. Definity contrast agent was given IV to delineate the left ventricular  endocardial borders. The left ventricular internal cavity size was normal in size. There is mild left ventricular hypertrophy. Left ventricular diastolic parameters are consistent with Grade I diastolic dysfunction (impaired relaxation). Right Ventricle: The right  ventricular size is not well visualized. Right ventricular systolic function was not well visualized. Tricuspid regurgitation signal is inadequate for assessing PA pressure. Left Atrium: Left atrial size was normal in size. Pericardium: There is no evidence of pericardial effusion. Mitral Valve: The mitral valve is normal in structure. Tricuspid Valve: The tricuspid valve is normal in structure. Tricuspid valve regurgitation is not demonstrated. Aortic Valve: The aortic valve is tricuspid. Aortic valve regurgitation is not visualized. No aortic stenosis is present. Pulmonic Valve: The pulmonic valve was not well visualized. Pulmonic valve regurgitation is not visualized. Aorta: The aortic root is normal in size and structure and aortic dilatation noted. There is mild dilatation of the ascending aorta, measuring 39 mm. IAS/Shunts: The interatrial septum was not well visualized. LEFT VENTRICLE PLAX 2D LVIDd:         4.60 cm      Diastology LVIDs:         3.40 cm      LV e' medial:    6.42 cm/s LV PW:         1.10 cm      LV E/e' medial:  11.2 LV IVS:        1.10 cm      LV e' lateral:   9.25 cm/s LVOT diam:     2.60 cm      LV E/e' lateral: 7.7 LV SV:         106 LV SV Index:   38 LVOT Area:     5.31 cm  LV Volumes (MOD) LV vol d, MOD A2C: 91.9 ml LV vol d, MOD A4C: 116.0 ml LV vol s, MOD A2C: 21.5 ml LV vol s, MOD A4C: 37.8 ml LV SV MOD A2C:     70.4 ml LV SV MOD A4C:     116.0 ml LV SV MOD BP:      72.2 ml LEFT ATRIUM           Index LA diam:      4.10 cm 1.48 cm/m LA Vol (A4C): 46.1 ml 16.61 ml/m  AORTIC VALVE LVOT Vmax:   108.00 cm/s LVOT Vmean:  70.200 cm/s LVOT VTI:    0.199 m  AORTA Ao Root diam: 3.80 cm Ao Asc diam:  3.90 cm MITRAL VALVE MV Area (PHT): 5.62 cm    SHUNTS MV Decel Time: 135 msec    Systemic VTI:  0.20 m MV E velocity: 71.60 cm/s  Systemic Diam: 2.60 cm MV A velocity: 90.00 cm/s MV E/A ratio:  0.80 Oswaldo Milian MD Electronically signed by Oswaldo Milian MD Signature Date/Time:  08/31/2020/12:03:23 PM    Final

## 2020-09-07 NOTE — Progress Notes (Signed)
Occupational Therapy Evaluation Patient Details Name: Robert [name] MRN: [id number] DOB: 12/31/1955 Today's Date: 09/07/2020    History of Present Illness Pt is a 64 y/o male with a PMH significant for CKD stage IIIb, DM, HTN, morbid obesity, BPH. He was admitted 09/01/2020 with severe hypoxic respiratory failure 2 COVID-19 PNA. Vaccinated   Clinical Impression   PTA pt lives at home with his wife and worked as a dump Administrator. Pt initially on 15L HFNC and 15L NRB with SpO2 98 with poor pleth. [place] probe fixed appropriately with improved reading with SpO2 99. NRB removed. SpO2 98 on 15L HFNC. Pt completed 3 trials of standing marching of 40 steps with desat to 88 and increased RR into 30s with quick rebound to 96 and RR decreased using pursed lip breathing and relaxation techniques. Pt currently mod A with LB ADL and will benefit from use of AE to maximize functional level of independence. Recommend DC home with [place]. Will follow acutely.    Follow Up Recommendations  Home health OT;Supervision/Assistance - 24 hour (initially)    Equipment Recommendations  None recommended by OT    Recommendations for Other Services       Precautions / Restrictions Precautions Precaution Comments: watch O2      Mobility Bed Mobility               General bed mobility comments: OOB in chair    Transfers Overall transfer level: Needs assistance   Transfers: Sit to/from Stand Sit to Stand: Supervision              Balance Overall balance assessment: Needs assistance   Sitting balance-Leahy Scale: Good       Standing balance-Leahy Scale: Fair                             ADL either performed or assessed with clinical judgement   ADL Overall ADL's : Needs assistance/impaired     Grooming: Set up;Sitting   Upper Body Bathing: Set up;Sitting   Lower Body Bathing: Minimal assistance;Sit to/from stand   Upper Body Dressing : Set up;Sitting   Lower Body  Dressing: Minimal assistance;Sit to/from stand   Toilet Transfer: Minimal assistance;BSC;Stand-pivot   Toileting- Clothing Manipulation and Hygiene: Minimal assistance;Sit to/from stand       Functional mobility during ADLs: Rolling walker;Cueing for safety;Min guard       Vision         Perception     Praxis      Pertinent Vitals/Pain Pain Assessment: No/denies pain     Hand Dominance Right   Extremity/Trunk Assessment Upper Extremity Assessment Upper Extremity Assessment: Overall WFL for tasks assessed   Lower Extremity Assessment Lower Extremity Assessment: Defer to PT evaluation   Cervical / Trunk Assessment Cervical / Trunk Assessment: Normal (increased body habitus)   Communication Communication Communication: No difficulties   Cognition Arousal/Alertness: Awake/alert Behavior During Therapy: WFL for tasks assessed/performed Overall Cognitive Status: Impaired/Different from baseline Area of Impairment: Memory                     Memory: Decreased short-term memory         General Comments: slow processing   General Comments       Exercises Other Exercises Other Exercises: pursed lip breathing Other Exercises: standing marching 3 trials; 40 steps Other Exercises: initiated theraband HEP - will need written HEP   Shoulder Instructions  Home Living Family/patient expects to be discharged to:: Private residence Living Arrangements: Spouse/significant other Available Help at Discharge: Family;Available 24 hours/day Type of Home: House Home Access: Stairs to enter Entrance Stairs-Number of Steps: 1   Home Layout: Two level;1/2 bath on main level;Bed/bath upstairs;Able to live on main level with bedroom/bathroom     Bathroom Shower/Tub: Teacher, early years/pre: Standard Bathroom Accessibility: Yes How Accessible: Accessible via walker Home Equipment: [place] - 4 wheels;Bedside commode;Grab bars - tub/shower;Wheelchair -  manual;Shower seat          Prior Functioning/Environment Level of Independence: Independent        Comments: drives a dump truck        OT Problem List: Decreased strength;Decreased activity tolerance;Decreased safety awareness;Decreased cognition;Decreased knowledge of use of DME or AE;Cardiopulmonary status limiting activity;Obesity      OT Treatment/Interventions: Self-care/ADL training;Therapeutic exercise;Neuromuscular education;Energy conservation;DME and/or AE instruction;Therapeutic activities;Cognitive remediation/compensation;Patient/family education;Balance training    OT Goals(Current goals can be found in the care plan section) Acute Rehab OT Goals Patient Stated Goal: to be able to bath himself OT Goal Formulation: With patient Time For Goal Achievement: 09/21/20 Potential to Achieve Goals: Good  OT Frequency: Min 2X/week   Barriers to D/C:            Co-evaluation              AM-PAC OT "6 Clicks" Daily Activity     Outcome Measure Help from another person eating meals?: None Help from another person taking care of personal grooming?: A Little Help from another person toileting, which includes using toliet, bedpan, or urinal?: A Lot Help from another person bathing (including washing, rinsing, drying)?: A Lot Help from another person to put on and taking off regular upper body clothing?: A Little Help from another person to put on and taking off regular lower body clothing?: A Lot 6 Click Score: 16   End of Session Equipment Utilized During Treatment: Oxygen (15 L) Nurse Communication: Mobility status;Other (comment) (SpO2)  Activity Tolerance: Patient tolerated treatment well Patient left: in chair;with call bell/phone within reach  OT Visit Diagnosis: Unsteadiness on feet (R26.81);Other abnormalities of gait and mobility (R26.89);Muscle weakness (generalized) (M62.81);Other symptoms and signs involving cognitive function                Time:  7092-9574 OT Time Calculation (min): 29 min Charges:  OT General Charges $OT Visit: 1 Visit OT Evaluation $OT Eval Moderate Complexity: 1 Mod OT Treatments $Self Care/Home Management : 8-22 mins  Maurie Boettcher, OT/L   Acute OT Clinical Specialist Acute Rehabilitation Services Pager [phone] Office [phone]   [facility] 09/07/2020, 4:00 PM

## 2020-09-08 DIAGNOSIS — U071 COVID-19: Secondary | ICD-10-CM | POA: Diagnosis not present

## 2020-09-08 DIAGNOSIS — J9600 Acute respiratory failure, unspecified whether with hypoxia or hypercapnia: Secondary | ICD-10-CM | POA: Diagnosis not present

## 2020-09-08 LAB — CBC WITH DIFFERENTIAL/PLATELET
Abs Immature Granulocytes: 0.28 10*3/uL — ABNORMAL HIGH (ref 0.00–0.07)
Basophils Absolute: 0 10*3/uL (ref 0.0–0.1)
Basophils Relative: 0 %
Eosinophils Absolute: 0.1 10*3/uL (ref 0.0–0.5)
Eosinophils Relative: 1 %
HCT: 41.4 % (ref 39.0–52.0)
Hemoglobin: 13.6 g/dL (ref 13.0–17.0)
Immature Granulocytes: 2 %
Lymphocytes Relative: 11 %
Lymphs Abs: 2 10*3/uL (ref 0.7–4.0)
MCH: 27.9 pg (ref 26.0–34.0)
MCHC: 32.9 g/dL (ref 30.0–36.0)
MCV: 85 fL (ref 80.0–100.0)
Monocytes Absolute: 1.1 10*3/uL — ABNORMAL HIGH (ref 0.1–1.0)
Monocytes Relative: 6 %
Neutro Abs: 14.1 10*3/uL — ABNORMAL HIGH (ref 1.7–7.7)
Neutrophils Relative %: 80 %
Platelets: 478 10*3/uL — ABNORMAL HIGH (ref 150–400)
RBC: 4.87 MIL/uL (ref 4.22–5.81)
RDW: 12.6 % (ref 11.5–15.5)
WBC: 17.5 10*3/uL — ABNORMAL HIGH (ref 4.0–10.5)
nRBC: 0 % (ref 0.0–0.2)

## 2020-09-08 LAB — COMPREHENSIVE METABOLIC PANEL
ALT: 26 U/L (ref 0–44)
AST: 25 U/L (ref 15–41)
Albumin: 2.3 g/dL — ABNORMAL LOW (ref 3.5–5.0)
Alkaline Phosphatase: 61 U/L (ref 38–126)
Anion gap: 10 (ref 5–15)
BUN: 31 mg/dL — ABNORMAL HIGH (ref 8–23)
CO2: 26 mmol/L (ref 22–32)
Calcium: 8.7 mg/dL — ABNORMAL LOW (ref 8.9–10.3)
Chloride: 100 mmol/L (ref 98–111)
Creatinine, Ser: 1.74 mg/dL — ABNORMAL HIGH (ref 0.61–1.24)
GFR, Estimated: 44 mL/min — ABNORMAL LOW (ref >60)
Glucose, Bld: 64 mg/dL — ABNORMAL LOW (ref 70–99)
Potassium: 4.2 mmol/L (ref 3.5–5.1)
Sodium: 136 mmol/L (ref 135–145)
Total Bilirubin: 0.4 mg/dL (ref 0.3–1.2)
Total Protein: 6.7 g/dL (ref 6.5–8.1)

## 2020-09-08 LAB — BRAIN NATRIURETIC PEPTIDE: B Natriuretic Peptide: 26 pg/mL (ref 0.0–100.0)

## 2020-09-08 LAB — GLUCOSE, CAPILLARY
Glucose-Capillary: 125 mg/dL — ABNORMAL HIGH (ref 70–99)
Glucose-Capillary: 123 mg/dL — ABNORMAL HIGH (ref 70–99)
Glucose-Capillary: 236 mg/dL — ABNORMAL HIGH (ref 70–99)
Glucose-Capillary: 203 mg/dL — ABNORMAL HIGH (ref 70–99)

## 2020-09-08 LAB — MAGNESIUM: Magnesium: 2.6 mg/dL — ABNORMAL HIGH (ref 1.7–2.4)

## 2020-09-08 LAB — D-DIMER, QUANTITATIVE: D-Dimer, Quant: 2.33 ug/mL-FEU — ABNORMAL HIGH (ref 0.00–0.50)

## 2020-09-08 LAB — C-REACTIVE PROTEIN: CRP: 9.7 mg/dL — ABNORMAL HIGH (ref <1.0)

## 2020-09-08 MED ORDER — GUAIFENESIN-DM 100-10 MG/5ML PO SYRP
10.0000 mL | ORAL_SOLUTION | Freq: Four times a day (QID) | ORAL | Status: DC | PRN
  Administered 2020-09-10 – 2020-09-12 (×2): 10 mL via ORAL
  Filled 2020-09-08 (×2): qty 10

## 2020-09-08 NOTE — Progress Notes (Signed)
OT Treatment Note - late entry  Pt seen second session to assist with return to bed to get into prone position. On entry to room, SpO2 91 and increased RR on 12L with pt in apparent discomfort with his back from sitting in the recliner. O2 increased to 15L for mobility. Pt able to achieve prone position with S. Once positioned O2 reduced to 12L and SpO2 98; pt in no distress. Nsg notified. Of note, pt usually goes to sleep at 5 pm and starts his day at 4:30am. Encouraged pt to be OOB for all meals and prone when returning to bed. Pt verbalized understanding. Will follow acutely to maximize functional level of independence. Pt very appreciative.     09/07/20 1620  OT Visit Information  Last OT Received On 09/08/20  Assistance Needed +1  History of Present Illness Pt is a 64 y/o male with a PMH significant for CKD stage IIIb, DM, HTN, morbid obesity, BPH. He was admitted 08/22/2020 with severe hypoxic respiratory failure 2 COVID-19 PNA. Vaccinated  Precautions  Precaution Comments watch O2  Pain Assessment  Pain Assessment Faces  Faces Pain Scale 4  Pain Location back discomfort  Pain Descriptors / Indicators Discomfort;Grimacing  Pain Intervention(s) Limited activity within patient's tolerance;Repositioned  Cognition  Arousal/Alertness Awake/alert  Behavior During Therapy WFL for tasks assessed/performed  Overall Cognitive Status No family/caregiver present to determine baseline cognitive functioning  General Comments slow processing; unsure of baseline cognition; decreased awareness of deficits adn how they impact function however demonstrated increased reasoning 2nd session  Upper Extremity Assessment  Upper Extremity Assessment Overall WFL for tasks assessed  Lower Extremity Assessment  Lower Extremity Assessment Defer to PT evaluation  Bed Mobility  Overal bed mobility Needs Assistance  General bed mobility comments Helped pt achieve prone position in bed. Pt able to stand and transfer  to bed with S.   Balance  Standing balance-Leahy Scale Fair  Transfers  Transfers Sit to/from Stand;Stand Pivot Transfers  Sit to Stand Supervision  Stand pivot transfers Supervision  General Comments  General comments (skin integrity, edema, etc.) SpO2 91 on entry with pt in semireclined position in chair. Pt reports back discomfort and requesting to return to bed to lie on his stomach. Increased O2 to 15L for activity. Once pt in prone, O2 reduced to 12L adn SpO2 98. Pt comfortable in no distress with lower RR.   OT - End of Session  Equipment Utilized During Treatment Oxygen (12-15L)  Activity Tolerance Patient tolerated treatment well  Patient left in bed;with call bell/phone within reach (prone)  Nurse Communication Mobility status;Other (comment) (pt proned; O2 needs)  OT Assessment/Plan  OT Plan Discharge plan remains appropriate  OT Visit Diagnosis Unsteadiness on feet (R26.81);Other abnormalities of gait and mobility (R26.89);Muscle weakness (generalized) (M62.81);Other symptoms and signs involving cognitive function  OT Frequency (ACUTE ONLY) Min 2X/week  Follow Up Recommendations Home health OT;Supervision/Assistance - 24 hour  OT Equipment None recommended by OT  AM-PAC OT "6 Clicks" Daily Activity Outcome Measure (Version 2)  Help from another person eating meals? 4  Help from another person taking care of personal grooming? 3  Help from another person toileting, which includes using toliet, bedpan, or urinal? 2  Help from another person bathing (including washing, rinsing, drying)? 2  Help from another person to put on and taking off regular upper body clothing? 3  Help from another person to put on and taking off regular lower body clothing? 2  6 Click Score 16  OT Goal Progression  Progress towards OT goals Progressing toward goals  Acute Rehab OT Goals  Patient Stated Goal to be able to bath himself  OT Goal Formulation With patient  Time For Goal Achievement  09/21/20  Potential to Achieve Goals Good  OT Time Calculation  OT Start Time (ACUTE ONLY) 1600  OT Stop Time (ACUTE ONLY) 1619  OT Time Calculation (min) 19 min  OT General Charges  $OT Visit 1 Visit  Maurie Boettcher, OT/L   Acute OT Clinical Specialist Acute Rehabilitation Services Pager [phone] Office [phone]

## 2020-09-08 NOTE — Progress Notes (Signed)
PROGRESS NOTE                                                                                                                                                                                                             Patient Demographics:    Robert [name], is a 64 y.o. male, DOB - 10-09-1955, QMG:[id number]  Outpatient Primary MD for the patient is Doree Albee, MD   Admit date - 08/12/2020   LOS - 9  Chief Complaint  Patient presents with  . hxpoxia       Brief Narrative: Patient is a 64 y.o. male with PMHx of CKD stage IIIb, DM-2, HTN, morbid obesity, BPH-who was admitted by PCCM on 11/28 due to severe hypoxic respiratory failure due to COVID-19 pneumonia.   Patient apparently had attended a funeral-where he apparently may have been exposed to COVID-19 infection.  COVID-19 vaccinated status: Vaccinated (second Pfizer vaccine dose> 6 months back-no booster yet)  Significant Events: 11/28>> Admit to [facility] for severe hypoxia due to COVID-19 pneumonia  Significant studies: 11/28>>Chest x-ray: Bilateral airspace opacities 11/29>> Echo: EF 89-16%, grade 1 diastolic dysfunction. 11/30>> chest x-ray: Diffuse bilateral pulmonary infiltrates. 12/1>> chest x-ray: Progressive multifocal pulmonary infiltrates.  COVID-19 medications: Steroids: 11/28>> Remdesivir: 11/28>> 12/2 Baricitinib: 11/28>>  Antibiotics: Rocephin: 11/28 x 1 Zithromax: 11/28 x 1  Microbiology data: 11/28 >>blood culture: No growth  Procedures: None  Consults: None  DVT prophylaxis: Prophylactic Lovenox    Subjective:   Patient in bed, appears comfortable, denies any headache, no fever, no chest pain or pressure, improved shortness of breath , no abdominal pain. No focal weakness.    Assessment  & Plan :   Acute Hypoxic Resp Failure due to Covid 19 Viral pneumonia: he has moderate to severe parenchymal lung injury with  breakthrough infection, he received 2 doses of Pfizer vaccine and the second shot was 6 months ago.  Had not received his third shot.  Is been treated with combination of Steroids, Remdesivir and Baricitinib.  Is now showing signs of recovery.  Monitor closely.  Encouraged to sit up in chair use I-S and flutter valve for pulmonary toiletry.  O2 requirements:  SpO2: 93 % O2 Flow Rate (L/min): 8 L/min FiO2 (%): 100 %   Prone/Incentive Spirometry: encouraged patient to lie prone for 3-4 hours at a time for a  total of 16 hours a day, and to encourage incentive spirometry use 3-4/hour.   Recent Labs  Lab 09/04/20 0452 09/05/20 0243 09/06/20 0504 09/07/20 1111 09/07/20 1112 09/08/20 0122  WBC 15.3* 12.9* 15.4* 18.3*  --  17.5*  HGB 14.7 14.2 14.6 14.4  --  13.6  HCT 43.9 41.6 44.4 43.7  --  41.4  PLT 296 295 336 465*  --  478*  CRP 1.2* 9.8* 11.4* 11.3*  --  9.7*  BNP  --  45.6 35.6  --  43.2 26.0  DDIMER 3.89* 3.66* 4.72* 3.56*  --  2.33*  AST 29 25 20 25   --  25  ALT 29 24 23 24   --  26  ALKPHOS 61 53 57 60  --  61  BILITOT 0.7 0.6 0.9 0.8  --  0.4  ALBUMIN 2.6* 2.4* 2.3* 2.4*  --  2.3*     Elevated D-dimer: Secondary to COVID-19 related infection- -ve Doppler.  Switched him to therapeutic Lovenox on 09/05/2020 regardless as his D-dimer is creeping higher and he is extremely high risk for developing a DVT.  Will continue till D-dimer persistently stays below 2, D-dimer is now downtrending.  AKI on CKD stage IIIb: AKI likely hemodynamically mediated-improving with supportive care.  Follow electrolytes closely. Baseline creatinine seems to be close to 1.7.  Mild incidental ascending aortic dilation on echocardiogram.  Follow with PCP and cardiology in 2 to 3 weeks post discharge.    HTN: BP controlled-continue amlodipine.  HLD: Continue statin  BPH: Continue Flomax  Presumed OSA: On HFNC - we will need outpatient sleep study.  Morbid obesity - BMI 50, follow with PCP.  DM-2  (A1c 7.9 on 11/28) with uncontrolled hyperglycemia due to steroids: CBGs improving-continue Levemir 30 units twice daily, 10 units of NovoLog with meals and resistant SSI.  Follow and adjust.  Recent Labs    09/05/20 1650 09/05/20 2038 09/06/20 0822  GLUCAP 228* 227* 166*      GI prophylaxis: PPI  Condition - Extremely Guarded  Family Communication  :  Previous MD  - Spouse Briscoe Burns [phone])  on 09/03/20, 09/06/20   Code Status :  Full Code  Diet :  Diet Order            Diet Carb Modified Fluid consistency: Thin; Room service appropriate? Yes  Diet effective now                  Disposition Plan  :  Status is: Inpatient  Remains inpatient appropriate because:Inpatient level of care appropriate due to severity of illness  Dispo: The patient is from: Home              Anticipated d/c is to: Home              Anticipated d/c date is: > 3 days              Patient currently is not medically stable to d/c.   Barriers to discharge: Hypoxia requiring O2 supplementation/complete 5 days of IV Remdesivir  Antimicorbials  :    Anti-infectives (From admission, onward)   Start     Dose/Rate Route Frequency Ordered Stop   08/31/20 1000  remdesivir 100 mg in sodium chloride 0.9 % 100 mL IVPB  Status:  Discontinued       "Followed by" Linked Group Details   100 mg 200 mL/hr over 30 Minutes Intravenous Daily 08/03/2020 2023 08/18/2020 2113   08/31/20 1000  remdesivir  100 mg in sodium chloride 0.9 % 100 mL IVPB        100 mg 200 mL/hr over 30 Minutes Intravenous Daily 08/14/2020 1428 09/03/20 1057   08/08/2020 2022  remdesivir 200 mg in sodium chloride 0.9% 250 mL IVPB  Status:  Discontinued       "Followed by" Linked Group Details   200 mg 580 mL/hr over 30 Minutes Intravenous Once 08/31/2020 2023 08/12/2020 2113   08/11/2020 1430  remdesivir 100 mg in sodium chloride 0.9 % 100 mL IVPB        100 mg 200 mL/hr over 30 Minutes Intravenous Every 30 min 08/28/2020 1428 08/20/2020 1639    09/01/2020 1145  cefTRIAXone (ROCEPHIN) 2 g in sodium chloride 0.9 % 100 mL IVPB        2 g 200 mL/hr over 30 Minutes Intravenous  Once 08/06/2020 1136 08/05/2020 1409   08/20/2020 1145  azithromycin (ZITHROMAX) 500 mg in sodium chloride 0.9 % 250 mL IVPB  Status:  Discontinued        500 mg 250 mL/hr over 60 Minutes Intravenous Every 24 hours 08/21/2020 1136 08/31/20 0853      Inpatient Medications  Scheduled Meds: . amLODipine  10 mg Oral Daily  . vitamin C  500 mg Oral Daily  . baricitinib  2 mg Oral Daily  . Chlorhexidine Gluconate Cloth  6 each Topical Daily  . docusate sodium  100 mg Oral BID  . enoxaparin (LOVENOX) injection  1 mg/kg Subcutaneous Q12H  . famotidine  20 mg Oral BID  . influenza vac split quadrivalent PF  0.5 mL Intramuscular Tomorrow-1000  . insulin aspart  0-20 Units Subcutaneous TID WC  . insulin aspart  0-5 Units Subcutaneous QHS  . insulin aspart  10 Units Subcutaneous TID WC  . insulin detemir  30 Units Subcutaneous BID  . pantoprazole  40 mg Oral Q1200  . predniSONE  50 mg Oral Daily  . rosuvastatin  40 mg Oral QHS  . tamsulosin  0.4 mg Oral Daily  . zinc sulfate  220 mg Oral Daily   Continuous Infusions:  PRN Meds:.[DISCONTINUED] ondansetron **OR** ondansetron (ZOFRAN) IV, polyethylene glycol, sodium chloride   Time Spent in minutes  35   See all Orders from today for further details   Lala Lund M.D on 09/08/2020 at 9:05 AM  To page go to [email or web address] - use universal password  [facility] -  Office  [phone]    Objective:   Vitals:   09/07/20 1940 09/07/20 2031 09/08/20 0458 09/08/20 0600  BP:  127/76 (!) 114/54   Pulse:  98 93 87  Resp:  12 12 18   Temp:  98.8 F (37.1 C) 98.1 F (36.7 C)   TempSrc:  Axillary Oral   SpO2: 97% 92% 90% 93%  Weight:   (!) 172.1 kg   Height:        Wt Readings from Last 3 Encounters:  09/08/20 (!) 172.1 kg  07/16/20 (!) 184 kg  04/28/20 (!) 180.1 kg     Intake/Output Summary (Last  24 hours) at 09/08/2020 0905 Last data filed at 09/08/2020 0459 Gross per 24 hour  Intake 680 ml  Output 1350 ml  Net -670 ml     Physical Exam  Awake Alert, No new F.N deficits, Normal affect [place].AT,PERRAL Supple Neck,No JVD, No cervical lymphadenopathy appriciated.  Symmetrical Chest wall movement, Good air movement bilaterally, CTAB RRR,No Gallops, Rubs or new Murmurs, No Parasternal Heave +ve B.Sounds, Abd Soft, No tenderness,  No organomegaly appriciated, No rebound - guarding or rigidity. No Cyanosis, Clubbing or edema, No new Rash or bruise   Data Review:    CBC Recent Labs  Lab 09/03/20 0147 09/03/20 0147 09/04/20 0452 09/05/20 0243 09/06/20 0504 09/07/20 1111 09/08/20 0122  WBC 17.4*   < > 15.3* 12.9* 15.4* 18.3* 17.5*  HGB 13.7   < > 14.7 14.2 14.6 14.4 13.6  HCT 41.3   < > 43.9 41.6 44.4 43.7 41.4  PLT 272   < > 296 295 336 465* 478*  MCV 85.0   < > 84.4 84.7 84.7 86.0 85.0  MCH 28.2   < > 28.3 28.9 27.9 28.3 27.9  MCHC 33.2   < > 33.5 34.1 32.9 33.0 32.9  RDW 12.6   < > 12.6 12.6 12.5 12.8 12.6  LYMPHSABS 1.0  --  1.5  --  1.3 0.9 2.0  MONOABS 0.7  --  0.8  --  0.5 0.8 1.1*  EOSABS 0.0  --  0.0  --  0.2 0.1 0.1  BASOSABS 0.0  --  0.0  --  0.0 0.0 0.0   < > = values in this interval not displayed.    Chemistries  Recent Labs  Lab 09/04/20 0452 09/05/20 0243 09/06/20 0504 09/07/20 1111 09/08/20 0122  NA 137 136 136 131* 136  K 4.3 4.2 4.1 4.3 4.2  CL 101 100 99 98 100  CO2 25 28 27 23 26   GLUCOSE 156* 123* 121* 200* 64*  BUN 34* 29* 30* 28* 31*  CREATININE 1.65* 1.76* 1.65* 1.60* 1.74*  CALCIUM 8.6* 8.4* 8.5* 8.3* 8.7*  MG  --   --  2.4 2.5* 2.6*  AST 29 25 20 25 25   ALT 29 24 23 24 26   ALKPHOS 61 53 57 60 61  BILITOT 0.7 0.6 0.9 0.8 0.4   ------------------------------------------------------------------------------------------------------------------ No results for input(s): CHOL, HDL, LDLCALC, TRIG, CHOLHDL, LDLDIRECT in the last 72  hours.  Lab Results  Component Value Date   HGBA1C 7.9 (H) 08/14/2020   ------------------------------------------------------------------------------------------------------------------ No results for input(s): TSH, T4TOTAL, T3FREE, THYROIDAB in the last 72 hours.  Invalid input(s): FREET3 ------------------------------------------------------------------------------------------------------------------ No results for input(s): VITAMINB12, FOLATE, FERRITIN, TIBC, IRON, RETICCTPCT in the last 72 hours.  Coagulation profile No results for input(s): INR, PROTIME in the last 168 hours.  Recent Labs    09/07/20 1111 09/08/20 0122  DDIMER 3.56* 2.33*    Cardiac Enzymes No results for input(s): CKMB, TROPONINI, MYOGLOBIN in the last 168 hours.  Invalid input(s): CK ------------------------------------------------------------------------------------------------------------------    Component Value Date/Time   BNP 26.0 09/08/2020 0122    Micro Results Recent Results (from the past 240 hour(s))  Resp Panel by RT-PCR (Flu A&B, Covid) Nasopharyngeal Swab     Status: Abnormal   Collection Time: 08/28/2020 10:41 AM   Specimen: Nasopharyngeal Swab; Nasopharyngeal(NP) swabs in vial transport medium  Result Value Ref Range Status   SARS Coronavirus 2 by RT PCR POSITIVE (A) NEGATIVE Final    Comment: RESULT CALLED TO, READ BACK BY AND VERIFIED WITH: GANNT E. @ [id number] ON [id number] BY HENDERSON L. (NOTE) SARS-CoV-2 target nucleic acids are DETECTED.  The SARS-CoV-2 RNA is generally detectable in upper respiratory specimens during the acute phase of infection. Positive results are indicative of the presence of the identified virus, but do not rule out bacterial infection or co-infection with other pathogens not detected by the test. Clinical correlation with patient history and other diagnostic information is necessary to determine patient  infection status. The expected result is Negative.   Fact Sheet for Patients: EntrepreneurPulse.com.au  Fact Sheet for Healthcare Providers: IncredibleEmployment.be  This test is not yet approved or cleared by the [place] FDA and  has been authorized for detection and/or diagnosis of SARS-CoV-2 by FDA under an Emergency Use Authorization (EUA).  This EUA will remain in effect (meaning this tes t can be used) for the duration of  the COVID-19 declaration under Section 564(b)(1) of the Act, 21 U.S.C. section 360bbb-3(b)(1), unless the authorization is terminated or revoked sooner.     Influenza A by PCR NEGATIVE NEGATIVE Final   Influenza B by PCR NEGATIVE NEGATIVE Final    Comment: (NOTE) The Xpert Xpress SARS-CoV-2/FLU/RSV plus assay is intended as an aid in the diagnosis of influenza from Nasopharyngeal swab specimens and should not be used as a sole basis for treatment. Nasal washings and aspirates are unacceptable for Xpert Xpress SARS-CoV-2/FLU/RSV testing.  Fact Sheet for Patients: EntrepreneurPulse.com.au  Fact Sheet for Healthcare Providers: IncredibleEmployment.be  This test is not yet approved or cleared by the [place] FDA and has been authorized for detection and/or diagnosis of SARS-CoV-2 by FDA under an Emergency Use Authorization (EUA). This EUA will remain in effect (meaning this test can be used) for the duration of the COVID-19 declaration under Section 564(b)(1) of the Act, 21 U.S.C. section 360bbb-3(b)(1), unless the authorization is terminated or revoked.  Performed at [facility], [address], [place], [place] [address]   Blood Culture (routine x 2)     Status: None   Collection Time: 08/16/2020 10:50 AM   Specimen: BLOOD RIGHT HAND  Result Value Ref Range Status   Specimen Description   Final    BLOOD RIGHT HAND BOTTLES DRAWN AEROBIC AND ANAEROBIC   Special Requests Blood Culture adequate volume  Final   Culture    Final    NO GROWTH 5 DAYS Performed at [facility], [address] [place], [place] [address]    Report Status 09/04/2020 FINAL  Final  Blood Culture (routine x 2)     Status: None   Collection Time: 08/19/2020 10:50 AM   Specimen: BLOOD  Result Value Ref Range Status   Specimen Description BLOOD  Final   Special Requests NONE  Final   Culture   Final    NO GROWTH 5 DAYS Performed at [facility], [address] [place], [place] [address]    Report Status 09/04/2020 FINAL  Final  MRSA PCR Screening     Status: None   Collection Time: 08/17/2020  6:05 PM   Specimen: Nasopharyngeal  Result Value Ref Range Status   MRSA by PCR NEGATIVE NEGATIVE Final    Comment:        The GeneXpert MRSA Assay (FDA approved for NASAL specimens only), is one component of a comprehensive MRSA colonization surveillance program. It is not intended to diagnose MRSA infection nor to guide or monitor treatment for MRSA infections. Performed at [facility], [place] [address] [place], [place] [address]     Radiology Reports DG Chest [place] 1 View  Result Date: 09/07/2020 CLINICAL DATA:  COVID, hypoxia EXAM: PORTABLE CHEST 1 VIEW COMPARISON:  09/05/2020 FINDINGS: Extensive airspace disease bilaterally, worsening since prior study. Low lung volumes. No effusions or pneumothorax. IMPRESSION: Worsening bilateral airspace disease. Electronically Signed   By: Rolm Baptise M.D.   On: 09/07/2020 08:41   DG Chest Port 1 View  Result Date: 09/05/2020 CLINICAL DATA:  Shortness of  breath and COVID-19 positivity EXAM: PORTABLE CHEST 1 VIEW COMPARISON:  09/02/2020 FINDINGS: Cardiac shadow is stable. Diffuse bilateral airspace opacities are again identified slightly improved when compared with the prior exam. No new focal abnormality is noted. No bony abnormality is seen. IMPRESSION: Slight improvement in bilateral airspace disease as described. Electronically Signed   By: Inez Catalina M.D.   On: 09/05/2020 09:11    DG Chest Port 1 View  Result Date: 09/02/2020 CLINICAL DATA:  Respiratory failure EXAM: PORTABLE CHEST 1 VIEW COMPARISON:  09/01/2020 FINDINGS: The lungs are symmetrically expanded. There is interval progression of asymmetric mid and lower lung zone pulmonary infiltrate, likely infectious or inflammatory in nature. No pneumothorax or pleural effusion. Cardiac size within normal limits. The pulmonary vascularity is normal. IMPRESSION: Progressive multifocal pulmonary infiltrate, likely infectious or inflammatory. Electronically Signed   By: Fidela Salisbury MD   On: 09/02/2020 06:28   DG Chest Port 1 View  Result Date: 08/06/2020 CLINICAL DATA:  Shortness of breath. EXAM: PORTABLE CHEST 1 VIEW COMPARISON:  None. FINDINGS: Cardiomediastinal silhouette is accentuated by low lung volumes and AP portable technique. Low lung volumes with bilateral peripheral predominant airspace opacities. No visible pleural effusions or pneumothorax. No acute osseous abnormality. IMPRESSION: Low lung volumes with bilateral peripheral predominant airspace opacities, concerning for multifocal pneumonia. Recommend correlation with COVID testing. Electronically Signed   By: Margaretha Sheffield MD   On: 08/31/2020 11:59   DG Chest Port 1V same Day  Result Date: 09/01/2020 CLINICAL DATA:  Respiratory failure. Shortness of breath. History of COVID. EXAM: PORTABLE CHEST 1 VIEW COMPARISON:  08/23/2020. FINDINGS: Cardiomegaly. Diffuse bilateral pulmonary infiltrates/edema again noted. Low lung volumes. No pleural effusion or pneumothorax. No acute bony abnormality. IMPRESSION: Cardiomegaly. Diffuse bilateral pulmonary infiltrates/edema again noted. Congestive heart failure and or bilateral pneumonia could present in this fashion. Low lung volumes. Electronically Signed   By: Marcello Moores  Register   On: 09/01/2020 09:29   VAS [place] LOWER EXTREMITY VENOUS (DVT)  Result Date: 09/05/2020  Lower Venous DVT Study Indications: Elevated d-dimer,  Covid.  Comparison Study: No prior studies. Performing Technologist: Darlin Coco, RDMS  Examination Guidelines: A complete evaluation includes B-mode imaging, spectral Doppler, color Doppler, and power Doppler as needed of all accessible portions of each vessel. Bilateral testing is considered an integral part of a complete examination. Limited examinations for reoccurring indications may be performed as noted. The reflux portion of the exam is performed with the patient in reverse Trendelenburg.  +---------+---------------+---------+-----------+----------+--------------+ RIGHT    CompressibilityPhasicitySpontaneityPropertiesThrombus Aging +---------+---------------+---------+-----------+----------+--------------+ CFV      Full           Yes      Yes                                 +---------+---------------+---------+-----------+----------+--------------+ SFJ      Full                                                        +---------+---------------+---------+-----------+----------+--------------+ FV Prox  Full                                                        +---------+---------------+---------+-----------+----------+--------------+  FV Mid   Full                                                        +---------+---------------+---------+-----------+----------+--------------+ FV DistalFull                                                        +---------+---------------+---------+-----------+----------+--------------+ PFV      Full                                                        +---------+---------------+---------+-----------+----------+--------------+ POP      Full           Yes      Yes                                 +---------+---------------+---------+-----------+----------+--------------+ PTV      Full                                                        +---------+---------------+---------+-----------+----------+--------------+  PERO     Full                                                        +---------+---------------+---------+-----------+----------+--------------+   +---------+---------------+---------+-----------+----------+--------------+ LEFT     CompressibilityPhasicitySpontaneityPropertiesThrombus Aging +---------+---------------+---------+-----------+----------+--------------+ CFV      Full           Yes      Yes                                 +---------+---------------+---------+-----------+----------+--------------+ SFJ      Full                                                        +---------+---------------+---------+-----------+----------+--------------+ FV Prox  Full                                                        +---------+---------------+---------+-----------+----------+--------------+ FV Mid   Full                                                        +---------+---------------+---------+-----------+----------+--------------+  FV DistalFull                                                        +---------+---------------+---------+-----------+----------+--------------+ PFV      Full                                                        +---------+---------------+---------+-----------+----------+--------------+ POP      Full           Yes      Yes                                 +---------+---------------+---------+-----------+----------+--------------+ PTV      Full                                                        +---------+---------------+---------+-----------+----------+--------------+ PERO     Full                                                        +---------+---------------+---------+-----------+----------+--------------+     Summary: RIGHT: - There is no evidence of deep vein thrombosis in the lower extremity.  - No cystic structure found in the popliteal fossa.  LEFT: - There is no evidence of deep vein thrombosis  in the lower extremity.  - No cystic structure found in the popliteal fossa.  *See table(s) above for measurements and observations. Electronically signed by Harold Barban MD on 09/05/2020 at 5:31:25 PM.    Final    ECHOCARDIOGRAM LIMITED  Result Date: 08/31/2020    ECHOCARDIOGRAM LIMITED REPORT   Patient Name:   Robert [name] Date of Exam: 08/31/2020 Medical Rec #:  [id number]       Height:       73.0 in Accession #:    [id number]      Weight:       364.2 lb Date of Birth:  10/09/1955       BSA:          2.776 m Patient Age:    34 years        BP:           142/86 mmHg Patient Gender: M               HR:           101 bpm. Exam Location:  Inpatient Procedure: Limited Echo, Cardiac Doppler, Color Doppler and Intracardiac            Opacification Agent Indications:    Chest Pain 786.50/ R07.9  History:        Patient has no prior history of Echocardiogram examinations.                 Risk Factors:Hypertension, Diabetes and  Non-Smoker. GERD.  Sonographer:    Vickie Epley RDCS Referring Phys: [id number] [facility] CHAND  Sonographer Comments: Covid positive. IMPRESSIONS  1. Left ventricular ejection fraction, by estimation, is 65 to 70%. The left ventricle has normal function. The left ventricle has no regional wall motion abnormalities. There is mild left ventricular hypertrophy. Left ventricular diastolic parameters are consistent with Grade I diastolic dysfunction (impaired relaxation).  2. Right ventricule is not well visualized but grossly normal size and systolic function. Tricuspid regurgitation signal is inadequate for assessing PA pressure.  3. The mitral valve is normal in structure. No evidence of mitral valve regurgitation.  4. The aortic valve is tricuspid. Aortic valve regurgitation is not visualized. No aortic stenosis is present.  5. Aortic dilatation noted. There is mild dilatation of the ascending aorta, measuring 39 mm. FINDINGS  Left Ventricle: Left ventricular ejection fraction, by estimation, is 65  to 70%. The left ventricle has normal function. The left ventricle has no regional wall motion abnormalities. Definity contrast agent was given IV to delineate the left ventricular  endocardial borders. The left ventricular internal cavity size was normal in size. There is mild left ventricular hypertrophy. Left ventricular diastolic parameters are consistent with Grade I diastolic dysfunction (impaired relaxation). Right Ventricle: The right ventricular size is not well visualized. Right ventricular systolic function was not well visualized. Tricuspid regurgitation signal is inadequate for assessing PA pressure. Left Atrium: Left atrial size was normal in size. Pericardium: There is no evidence of pericardial effusion. Mitral Valve: The mitral valve is normal in structure. Tricuspid Valve: The tricuspid valve is normal in structure. Tricuspid valve regurgitation is not demonstrated. Aortic Valve: The aortic valve is tricuspid. Aortic valve regurgitation is not visualized. No aortic stenosis is present. Pulmonic Valve: The pulmonic valve was not well visualized. Pulmonic valve regurgitation is not visualized. Aorta: The aortic root is normal in size and structure and aortic dilatation noted. There is mild dilatation of the ascending aorta, measuring 39 mm. IAS/Shunts: The interatrial septum was not well visualized. LEFT VENTRICLE PLAX 2D LVIDd:         4.60 cm      Diastology LVIDs:         3.40 cm      LV e' medial:    6.42 cm/s LV PW:         1.10 cm      LV E/e' medial:  11.2 LV IVS:        1.10 cm      LV e' lateral:   9.25 cm/s LVOT diam:     2.60 cm      LV E/e' lateral: 7.7 LV SV:         106 LV SV Index:   38 LVOT Area:     5.31 cm  LV Volumes (MOD) LV vol d, MOD A2C: 91.9 ml LV vol d, MOD A4C: 116.0 ml LV vol s, MOD A2C: 21.5 ml LV vol s, MOD A4C: 37.8 ml LV SV MOD A2C:     70.4 ml LV SV MOD A4C:     116.0 ml LV SV MOD BP:      72.2 ml LEFT ATRIUM           Index LA diam:      4.10 cm 1.48 cm/m LA Vol  (A4C): 46.1 ml 16.61 ml/m  AORTIC VALVE LVOT Vmax:   108.00 cm/s LVOT Vmean:  70.200 cm/s LVOT VTI:    0.199 m  AORTA Ao Root diam:  3.80 cm Ao Asc diam:  3.90 cm MITRAL VALVE MV Area (PHT): 5.62 cm    SHUNTS MV Decel Time: 135 msec    Systemic VTI:  0.20 m MV E velocity: 71.60 cm/s  Systemic Diam: 2.60 cm MV A velocity: 90.00 cm/s MV E/A ratio:  0.80 Oswaldo Milian MD Electronically signed by Oswaldo Milian MD Signature Date/Time: 08/31/2020/12:03:23 PM    Final

## 2020-09-08 NOTE — Progress Notes (Signed)
PT Cancellation Note  Patient Details Name: Robert [name] MRN: [id number] DOB: May 28, 1956   Cancelled Treatment:    Reason Eval/Treat Not Completed: Medical issues which prohibited therapy  Attempted to see ~9:00 am and pt just had very bloody nose and NT in to help him get bathed.  Currently, RN recommended defer due to lack of HFNC (he is requiring 8+ L oxygen, however his HFNC was discarded after bloody nose and his new one has not arrived on unit. At rest he is saturating 92% on regular nasal cannula on 8L.    Arby Barrette, PT Pager [phone]  Jeanie Cooks Rhylee Nunn 09/08/2020, 1:21 PM

## 2020-09-08 NOTE — Progress Notes (Signed)
   09/08/20 1236  Assess: MEWS Score  Temp 98.8 F (37.1 C)  BP (!) 138/96  Pulse Rate (!) 104  ECG Heart Rate (!) 104  Resp (!) 24  Level of Consciousness Alert  SpO2 95 %  O2 Device HFNC  O2 Flow Rate (L/min) 8 L/min  Assess: MEWS Score  MEWS Temp 0  MEWS Systolic 0  MEWS Pulse 1  MEWS RR 1  MEWS LOC 0  MEWS Score 2  MEWS Score Color Yellow  Assess: if the MEWS score is Yellow or Red  Were vital signs taken at a resting state? Yes  Focused Assessment No change from prior assessment  Early Detection of Sepsis Score *See Row Information* Low  MEWS guidelines implemented *See Row Information* Yes  Treat  MEWS Interventions Administered prn meds/treatments  Take Vital Signs  Increase Vital Sign Frequency  Yellow: Q 2hr X 2 then Q 4hr X 2, if remains yellow, continue Q 4hrs  Escalate  MEWS: Escalate Yellow: discuss with charge nurse/RN and consider discussing with provider and RRT  Notify: Charge Nurse/RN  Name of Charge Nurse/RN Notified Erin RN  Date Charge Nurse/RN Notified 09/08/20  Time Charge Nurse/RN Notified 1251

## 2020-09-08 NOTE — Progress Notes (Signed)
Physical Therapy Treatment Patient Details Name: Robert [name] MRN: [id number] DOB: 10/18/1955 Today's Date: 09/08/2020    History of Present Illness Pt is a 64 y/o male with a PMH significant for CKD stage IIIb, DM, HTN, morbid obesity, BPH. He was admitted 08/13/2020 with severe hypoxic respiratory failure 2 COVID-19 PNA. Vaccinated    PT Comments    On arrival, pt appeared in distress/uncomfortable with sats 88% on [place] 8L and HR 127. HFNC that RN ordered arrived and placed patient on HFNC @8L . Pt requiring incr time and cues for pursed lip breathing and use of IS to recover sats to 92%. Patient reporting chest pain with use of IS (grimaces and guards chest). When asked where it hurts he points to lower sternum. Patient also reporting low back pain and assisted pt to scoot back into recliner and provided pillows to support his back. Patient's HR slightly lowered as made more comfortable, but remained 118-120 bpm. RN made aware of chest pain and tachycardia and in to assess patient. Further exercise or mobility deferred due to degree of chest pain and tachycardia.    Follow Up Recommendations  Home health PT;Supervision/Assistance - 24 hour     Equipment Recommendations  Rolling walker with 5" wheels    Recommendations for Other Services       Precautions / Restrictions Precautions Precautions: None Precaution Comments: watch O2, tachycardia    Mobility  Bed Mobility                  Transfers                 General transfer comment: deferred due to tachycardia and reports of new onset chest pain  Ambulation/Gait                 Stairs             Wheelchair Mobility    Modified Rankin (Stroke Patients Only)       Balance Overall balance assessment: Needs assistance Sitting-balance support: Feet supported;No upper extremity supported Sitting balance-Leahy Scale: Good                                      Cognition  Arousal/Alertness: Awake/alert Behavior During Therapy: Anxious Overall Cognitive Status: No family/caregiver present to determine baseline cognitive functioning Area of Impairment: Memory;Problem solving                     Memory: Decreased short-term memory       Problem Solving: Slow processing General Comments: slow processing; unsure of baseline cognition;       Exercises Other Exercises Other Exercises: pursed lip breathing required moderate cues to correct technique and continue to utilize to raise O2 sats Other Exercises: IS x 3 reps to assist with O2 sats; pt grimacing with deep breaths and reports new onset substernal chest pain; reports pain when he coughs; RN made aware and in to assess pt    General Comments General comments (skin integrity, edema, etc.): on arrival, HFNC RN had ordered arrived (pt on regular  on 8L due to lack of HFNC); sats 88%, HR 127. Converted to HFNC on 8L with sats slowly improving to 92% HR 119-123.       Pertinent Vitals/Pain Pain Assessment: Faces Faces Pain Scale: Hurts little more Pain Location: back discomfort Pain Descriptors / Indicators: Discomfort Pain Intervention(s): Limited activity  within patient's tolerance;Repositioned    Home Living                      Prior Function            PT Goals (current goals can now be found in the care plan section) Acute Rehab PT Goals Patient Stated Goal: to be able to bath himself Time For Goal Achievement: 09/18/20 Potential to Achieve Goals: Good Progress towards PT goals: Progressing toward goals    Frequency    Min 3X/week      PT Plan Current plan remains appropriate    Co-evaluation              AM-PAC PT "6 Clicks" Mobility   Outcome Measure  Help needed turning from your back to your side while in a flat bed without using bedrails?: None Help needed moving from lying on your back to sitting on the side of a flat bed without using bedrails?:  A Little Help needed moving to and from a bed to a chair (including a wheelchair)?: A Little Help needed standing up from a chair using your arms (e.g., wheelchair or bedside chair)?: A Little Help needed to walk in hospital room?: A Little Help needed climbing 3-5 steps with a railing? : A Lot 6 Click Score: 18    End of Session Equipment Utilized During Treatment: Oxygen Activity Tolerance: Patient limited by pain;Treatment limited secondary to medical complications (Comment) Patient left: in chair;with call bell/phone within reach;with nursing/sitter in room Nurse Communication: Other (comment) (incr HR, chest pain with IS or coughing,sats slow to recover) PT Visit Diagnosis: Unsteadiness on feet (R26.81);Other abnormalities of gait and mobility (R26.89);Muscle weakness (generalized) (M62.81)     Time: 9728-2060 PT Time Calculation (min) (ACUTE ONLY): 30 min  Charges:  $Therapeutic Exercise: 8-22 mins $Self Care/Home Management: 8-22                      Arby Barrette, PT Pager [phone]    Rexanne Mano 09/08/2020, 5:40 PM

## 2020-09-09 ENCOUNTER — Inpatient Hospital Stay (HOSPITAL_COMMUNITY): Payer: 59

## 2020-09-09 LAB — CBC WITH DIFFERENTIAL/PLATELET
Abs Immature Granulocytes: 0.21 10*3/uL — ABNORMAL HIGH (ref 0.00–0.07)
Basophils Absolute: 0 10*3/uL (ref 0.0–0.1)
Basophils Relative: 0 %
Eosinophils Absolute: 0.1 10*3/uL (ref 0.0–0.5)
Eosinophils Relative: 1 %
HCT: 41 % (ref 39.0–52.0)
Hemoglobin: 13.3 g/dL (ref 13.0–17.0)
Immature Granulocytes: 1 %
Lymphocytes Relative: 9 %
Lymphs Abs: 1.4 10*3/uL (ref 0.7–4.0)
MCH: 27.8 pg (ref 26.0–34.0)
MCHC: 32.4 g/dL (ref 30.0–36.0)
MCV: 85.8 fL (ref 80.0–100.0)
Monocytes Absolute: 1.1 10*3/uL — ABNORMAL HIGH (ref 0.1–1.0)
Monocytes Relative: 7 %
Neutro Abs: 12.5 10*3/uL — ABNORMAL HIGH (ref 1.7–7.7)
Neutrophils Relative %: 82 %
Platelets: 431 10*3/uL — ABNORMAL HIGH (ref 150–400)
RBC: 4.78 MIL/uL (ref 4.22–5.81)
RDW: 12.5 % (ref 11.5–15.5)
WBC: 15.3 10*3/uL — ABNORMAL HIGH (ref 4.0–10.5)
nRBC: 0 % (ref 0.0–0.2)

## 2020-09-09 LAB — COMPREHENSIVE METABOLIC PANEL
ALT: 27 U/L (ref 0–44)
AST: 22 U/L (ref 15–41)
Albumin: 2.2 g/dL — ABNORMAL LOW (ref 3.5–5.0)
Alkaline Phosphatase: 59 U/L (ref 38–126)
Anion gap: 9 (ref 5–15)
BUN: 29 mg/dL — ABNORMAL HIGH (ref 8–23)
CO2: 26 mmol/L (ref 22–32)
Calcium: 8.6 mg/dL — ABNORMAL LOW (ref 8.9–10.3)
Chloride: 99 mmol/L (ref 98–111)
Creatinine, Ser: 1.65 mg/dL — ABNORMAL HIGH (ref 0.61–1.24)
GFR, Estimated: 47 mL/min — ABNORMAL LOW (ref >60)
Glucose, Bld: 103 mg/dL — ABNORMAL HIGH (ref 70–99)
Potassium: 4.6 mmol/L (ref 3.5–5.1)
Sodium: 134 mmol/L — ABNORMAL LOW (ref 135–145)
Total Bilirubin: 0.6 mg/dL (ref 0.3–1.2)
Total Protein: 6.4 g/dL — ABNORMAL LOW (ref 6.5–8.1)

## 2020-09-09 LAB — GLUCOSE, CAPILLARY
Glucose-Capillary: 62 mg/dL — ABNORMAL LOW (ref 70–99)
Glucose-Capillary: 128 mg/dL — ABNORMAL HIGH (ref 70–99)
Glucose-Capillary: 236 mg/dL — ABNORMAL HIGH (ref 70–99)
Glucose-Capillary: 336 mg/dL — ABNORMAL HIGH (ref 70–99)
Glucose-Capillary: 317 mg/dL — ABNORMAL HIGH (ref 70–99)

## 2020-09-09 LAB — D-DIMER, QUANTITATIVE: D-Dimer, Quant: 2.47 ug/mL-FEU — ABNORMAL HIGH (ref 0.00–0.50)

## 2020-09-09 LAB — BRAIN NATRIURETIC PEPTIDE: B Natriuretic Peptide: 32.1 pg/mL (ref 0.0–100.0)

## 2020-09-09 LAB — MAGNESIUM: Magnesium: 2.5 mg/dL — ABNORMAL HIGH (ref 1.7–2.4)

## 2020-09-09 LAB — C-REACTIVE PROTEIN: CRP: 8.8 mg/dL — ABNORMAL HIGH (ref <1.0)

## 2020-09-09 MED ORDER — PREDNISONE 5 MG PO TABS: 30.0000 mg | ORAL_TABLET | Freq: Every day | ORAL | Status: DC

## 2020-09-09 MED ORDER — LIP MEDEX EX OINT
TOPICAL_OINTMENT | CUTANEOUS | Status: DC | PRN
  Filled 2020-09-09: qty 7

## 2020-09-09 MED ORDER — INSULIN ASPART 100 UNIT/ML ~~LOC~~ SOLN
6.0000 [IU] | Freq: Three times a day (TID) | SUBCUTANEOUS | Status: DC
  Administered 2020-09-09: 6 [IU] via SUBCUTANEOUS

## 2020-09-09 MED ORDER — INSULIN DETEMIR 100 UNIT/ML ~~LOC~~ SOLN: 30.0000 [IU] | Freq: Every day | SUBCUTANEOUS | Status: DC

## 2020-09-09 MED ORDER — INSULIN ASPART 100 UNIT/ML ~~LOC~~ SOLN: 8.0000 [IU] | Freq: Three times a day (TID) | SUBCUTANEOUS | Status: DC

## 2020-09-09 MED ORDER — INSULIN DETEMIR 100 UNIT/ML ~~LOC~~ SOLN
20.0000 [IU] | Freq: Two times a day (BID) | SUBCUTANEOUS | Status: DC
  Administered 2020-09-10: 20 [IU] via SUBCUTANEOUS
  Filled 2020-09-09 (×4): qty 0.2

## 2020-09-09 MED ORDER — INSULIN ASPART 100 UNIT/ML ~~LOC~~ SOLN: 5.0000 [IU] | Freq: Three times a day (TID) | SUBCUTANEOUS | Status: DC

## 2020-09-09 NOTE — Progress Notes (Signed)
Occupational Therapy Treatment Patient Details Name: Robert [name] MRN: [id number] DOB: 05/25/1956 Today's Date: 09/09/2020    History of present illness Pt is a 64 y/o male with a PMH significant for CKD stage IIIb, DM, HTN, morbid obesity, BPH. He was admitted 08/14/2020 with severe hypoxic respiratory failure 2 COVID-19 PNA. Vaccinated   OT comments  Pt seen for OT follow up session with focus on ADL mobility progression and energy conservation training. Pt up in chair at start of session on 4L HFNC. With beginning of mobility, pts sats began to drop to mid 80%s. Pt then placed on 6L [place]. With mobility from chair to door at supervision level, O2 sats quickly dropped to ~78%. Pt ultimately needed 15L [place] and seated rest to recover. Pt required ~7 mins recovery time with cues for pursed lip breathing before he was able to be titrated back down to 4L HFNC. Pt then completed incentive spirometry and flutter valve. Encouraged pt to complete sit <> stand throughout the day to improve activity tolerance. D/c recs remain appropriate, will continue to follow.   Follow Up Recommendations  Home health OT;Supervision/Assistance - 24 hour    Equipment Recommendations  None recommended by OT    Recommendations for Other Services      Precautions / Restrictions Precautions Precautions: None Precaution Comments: watch O2, tachycardia Restrictions Weight Bearing Restrictions: No       Mobility Bed Mobility               General bed mobility comments: up in chair, returned to chair  Transfers Overall transfer level: Needs assistance Equipment used: None Transfers: Sit to/from Stand Sit to Stand: Supervision         General transfer comment: supervision for safety and line management    Balance Overall balance assessment: Needs assistance Sitting-balance support: Feet supported;No upper extremity supported Sitting balance-Leahy Scale: Good     Standing balance support: No upper  extremity supported;Bilateral upper extremity supported;Single extremity supported Standing balance-Leahy Scale: Fair                             ADL either performed or assessed with clinical judgement   ADL Overall ADL's : Needs assistance/impaired                     Lower Body Dressing: Minimal assistance;Sit to/from stand   Toilet Transfer: Supervision/safety;Ambulation;Regular Toilet           Functional mobility during ADLs: Supervision/safety;Cueing for safety General ADL Comments: pt able to progress in functional mobility, but continues to show decreased activity tolerance with increased O2 demands. Required education and training on energy conservation techniques to safely proceed with tasks     Vision Baseline Vision/History: No visual deficits Patient Visual Report: No change from baseline Vision Assessment?: No apparent visual deficits   Perception     Praxis      Cognition Arousal/Alertness: Awake/alert Behavior During Therapy: WFL for tasks assessed/performed Overall Cognitive Status: No family/caregiver present to determine baseline cognitive functioning Area of Impairment: Problem solving                             Problem Solving: Slow processing General Comments: increased time to process throughout session        Exercises     Shoulder Instructions       General Comments  Pertinent Vitals/ Pain       Pain Assessment: No/denies pain  Home Living                                          Prior Functioning/Environment              Frequency  Min 2X/week        Progress Toward Goals  OT Goals(current goals can now be found in the care plan section)  Progress towards OT goals: Progressing toward goals  Acute Rehab OT Goals Patient Stated Goal: to be able to bathe himself OT Goal Formulation: With patient Time For Goal Achievement: 09/21/20 Potential to Achieve Goals:  Good  Plan Discharge plan remains appropriate    Co-evaluation                 AM-PAC OT "6 Clicks" Daily Activity     Outcome Measure   Help from another person eating meals?: None Help from another person taking care of personal grooming?: A Little Help from another person toileting, which includes using toliet, bedpan, or urinal?: A Little Help from another person bathing (including washing, rinsing, drying)?: A Little Help from another person to put on and taking off regular upper body clothing?: A Little Help from another person to put on and taking off regular lower body clothing?: A Little 6 Click Score: 19    End of Session Equipment Utilized During Treatment: Oxygen  OT Visit Diagnosis: Unsteadiness on feet (R26.81);Other abnormalities of gait and mobility (R26.89);Muscle weakness (generalized) (M62.81);Other symptoms and signs involving cognitive function   Activity Tolerance Patient tolerated treatment well   Patient Left in chair;with call bell/phone within reach   Nurse Communication Mobility status        Time: 9794-8016 OT Time Calculation (min): 26 min  Charges: OT General Charges $OT Visit: 1 Visit OT Treatments $Self Care/Home Management : 23-37 mins  Zenovia Jarred, MSOT, OTR/L [facility] Office Number: [phone] Pager: [phone]  Zenovia Jarred 09/09/2020, 3:29 PM

## 2020-09-09 NOTE — Progress Notes (Signed)
Inpatient Diabetes Program Recommendations  AACE/ADA: New Consensus Statement on Inpatient Glycemic Control (2015)  Target Ranges:  Prepandial:   less than 140 mg/dL      Peak postprandial:   less than 180 mg/dL (1-2 hours)      Critically ill patients:  140 - 180 mg/dL   Lab Results  Component Value Date   GLUCAP 128 (H) 09/09/2020   HGBA1C 7.9 (H) 08/29/2020    Review of Glycemic Control Results for KHALEL, ALMS (MRN [id number]) as of 09/09/2020 09:31  Ref. Range 09/08/2020 01:22  Glucose Latest Ref Range: 70 - 99 mg/dL 64 (L)   Results for BRALEN, WILTGEN (MRN [id number]) as of 09/09/2020 09:31  Ref. Range 09/08/2020 07:53 09/08/2020 12:01 09/08/2020 16:27 09/08/2020 20:22 09/09/2020 07:36 09/09/2020 08:32  Glucose-Capillary Latest Ref Range: 70 - 99 mg/dL 125 (H) 123 (H) 236 (H) 203 (H) 62 (L) 128 (H)   Current orders for Inpatient glycemic control:  Levemir 30 units BID Novolog 0-20 units TID Novolog 0-5 units QHS Novolog 10 units TID  Inpatient Diabetes Program Recommendations:  Might consider slightly decreasing basal insulin to avoid fasting hypoglycemia.    Levemir 28 units BID   Will continue to follow while inpatient.  Thank you, Reche Dixon, RN, BSN Diabetes Coordinator Inpatient Diabetes Program [phone] (team pager from 8a-5p)

## 2020-09-09 NOTE — TOC Benefit Eligibility Note (Signed)
Transition of Care [facility]) Benefit Eligibility Note    Patient Details  Name: Robert [name] MRN: [id number] Date of Birth: 1956/03/31   Medication/Dose: ELIQUIS  5 MG BID   and  ELIQUIS  2.5 MG BID  Covered?: Yes  Tier: 2 Drug  Prescription Coverage Preferred Pharmacy: [facility]  [place] with Person/Company/Phone Number:: JEAN @  [employer] GP #  [phone]  Co-Pay: $45.00 FOR EACH PRESCRIPTION  Prior Approval: No  Deductible: Unmet (OUT-OF-POCKET:UNMET)       Memory Argue Phone Number: 09/09/2020, 4:40 PM

## 2020-09-09 NOTE — Plan of Care (Signed)
  Problem: Respiratory: Goal: Complications related to the disease process, condition or treatment will be avoided or minimized Outcome: Progressing   Problem: Health Behavior/Discharge Planning: Goal: Ability to manage health-related needs will improve Outcome: Progressing   Problem: Clinical Measurements: Goal: Ability to maintain clinical measurements within normal limits will improve Outcome: Progressing

## 2020-09-09 NOTE — Progress Notes (Addendum)
PROGRESS NOTE                                                                                                                                                                                                             Patient Demographics:    Robert [name], is a 64 y.o. male, DOB - Sep 23, 1956, ZOX:[id number]  Outpatient Primary MD for the patient is Doree Albee, MD   Admit date - 09/01/2020   LOS - 10  Chief Complaint  Patient presents with  . hxpoxia       Brief Narrative: Patient is a 64 y.o. male with PMHx of CKD stage IIIb, DM-2, HTN, morbid obesity, BPH-who was admitted by PCCM on 11/28 due to severe hypoxic respiratory failure due to COVID-19 pneumonia.   Patient apparently had attended a funeral-where he apparently may have been exposed to COVID-19 infection.  COVID-19 vaccinated status: Vaccinated (second Pfizer vaccine dose> 6 months back-no booster yet)  Significant Events: 11/28>> Admit to [facility] for severe hypoxia due to COVID-19 pneumonia  Significant studies: 11/28>>Chest x-ray: Bilateral airspace opacities 11/29>> Echo: EF 81-19%, grade 1 diastolic dysfunction. 11/30>> chest x-ray: Diffuse bilateral pulmonary infiltrates. 12/1>> chest x-ray: Progressive multifocal pulmonary infiltrates.  COVID-19 medications: Steroids: 11/28>> Remdesivir: 11/28>> 12/2 Baricitinib: 11/28>>  Antibiotics: Rocephin: 11/28 x 1 Zithromax: 11/28 x 1  Microbiology data: 11/28 >>blood culture: No growth  Procedures: None  Consults: None  DVT prophylaxis: Prophylactic Lovenox    Subjective:   Patient in bed, appears comfortable, denies any headache, no fever, no chest pain or pressure, improved shortness of breath , no abdominal pain. No focal weakness.   Assessment  & Plan :   Acute Hypoxic Resp Failure due to Covid 19 Viral pneumonia: he has moderate to severe parenchymal lung injury with breakthrough  infection, he received 2 doses of Pfizer vaccine and the second shot was 6 months ago.  Had not received his third shot.  Is been treated with combination of Steroids, Remdesivir and Baricitinib.  Is now showing signs of recovery.  Monitor closely.  Encouraged to sit up in chair use I-S and flutter valve for pulmonary toiletry.  He continues to improve likely discharge with home oxygen in the next 1 to 2 days.  O2 requirements:  SpO2: 94 % O2 Flow Rate (L/min): 4 L/min FiO2 (%): 100 %  Prone/Incentive Spirometry: encouraged patient to lie prone for 3-4 hours at a time for a total of 16 hours a day, and to encourage incentive spirometry use 3-4/hour.   Recent Labs  Lab 09/05/20 0243 09/06/20 0504 09/07/20 1111 09/07/20 1112 09/08/20 0122 09/09/20 0223  WBC 12.9* 15.4* 18.3*  --  17.5* 15.3*  HGB 14.2 14.6 14.4  --  13.6 13.3  HCT 41.6 44.4 43.7  --  41.4 41.0  PLT 295 336 465*  --  478* 431*  CRP 9.8* 11.4* 11.3*  --  9.7* 8.8*  BNP 45.6 35.6  --  43.2 26.0 32.1  DDIMER 3.66* 4.72* 3.56*  --  2.33* 2.47*  AST 25 20 25   --  25 22  ALT 24 23 24   --  26 27  ALKPHOS 53 57 60  --  61 59  BILITOT 0.6 0.9 0.8  --  0.4 0.6  ALBUMIN 2.4* 2.3* 2.4*  --  2.3* 2.2*     Elevated D-dimer: Secondary to COVID-19 related infection- -ve Doppler.  Switched him to therapeutic Lovenox on 09/05/2020 regardless as his D-dimer is creeping higher and he is extremely high risk for developing a DVT.  Will continue till D-dimer persistently stays below 2, D-dimer is now downtrending.  Weeks of prophylactic Eliquis upon discharge.  AKI on CKD stage IIIb: AKI likely hemodynamically mediated-improving with supportive care.  Follow electrolytes closely. Baseline creatinine seems to be close to 1.7.  Mild incidental ascending aortic dilation on echocardiogram.  Follow with PCP and cardiology in 2 to 3 weeks post discharge.    HTN: BP controlled-continue amlodipine.  HLD: Continue statin  BPH: Continue  Flomax  Presumed OSA: On HFNC - we will need outpatient sleep study.  Morbid obesity - BMI 50, follow with PCP.  DM-2 (A1c 7.9 on 11/28) with uncontrolled hyperglycemia due to steroids: CBGs improving-Insulin adjusted 09/09/20 due to steroid taper..  Recent Labs    09/05/20 1650 09/05/20 2038 09/06/20 0822  GLUCAP 228* 227* 166*      GI prophylaxis: PPI  Condition - Extremely Guarded  Family Communication  :  Previous MD  - Spouse Briscoe Burns [phone])  on 09/03/20, 09/06/20   Code Status :  Full Code  Diet :  Diet Order            Diet Carb Modified Fluid consistency: Thin; Room service appropriate? Yes  Diet effective now                  Disposition Plan  :  Status is: Inpatient  Remains inpatient appropriate because:Inpatient level of care appropriate due to severity of illness  Dispo: The patient is from: Home              Anticipated d/c is to: Home              Anticipated d/c date is: > 3 days              Patient currently is not medically stable to d/c.   Barriers to discharge: Hypoxia requiring O2 supplementation/complete 5 days of IV Remdesivir  Antimicorbials  :    Anti-infectives (From admission, onward)   Start     Dose/Rate Route Frequency Ordered Stop   08/31/20 1000  remdesivir 100 mg in sodium chloride 0.9 % 100 mL IVPB  Status:  Discontinued       "Followed by" Linked Group Details   100 mg 200 mL/hr over 30 Minutes Intravenous Daily 08/11/2020 2023  08/09/2020 2113   08/31/20 1000  remdesivir 100 mg in sodium chloride 0.9 % 100 mL IVPB        100 mg 200 mL/hr over 30 Minutes Intravenous Daily 08/20/2020 1428 09/03/20 1057   08/16/2020 2022  remdesivir 200 mg in sodium chloride 0.9% 250 mL IVPB  Status:  Discontinued       "Followed by" Linked Group Details   200 mg 580 mL/hr over 30 Minutes Intravenous Once 08/14/2020 2023 08/03/2020 2113   08/29/2020 1430  remdesivir 100 mg in sodium chloride 0.9 % 100 mL IVPB        100 mg 200 mL/hr over  30 Minutes Intravenous Every 30 min 09/01/2020 1428 08/22/2020 1639   08/22/2020 1145  cefTRIAXone (ROCEPHIN) 2 g in sodium chloride 0.9 % 100 mL IVPB        2 g 200 mL/hr over 30 Minutes Intravenous  Once 08/21/2020 1136 08/29/2020 1409   08/21/2020 1145  azithromycin (ZITHROMAX) 500 mg in sodium chloride 0.9 % 250 mL IVPB  Status:  Discontinued        500 mg 250 mL/hr over 60 Minutes Intravenous Every 24 hours 09/01/2020 1136 08/31/20 0853      Inpatient Medications  Scheduled Meds: . amLODipine  10 mg Oral Daily  . vitamin C  500 mg Oral Daily  . baricitinib  2 mg Oral Daily  . Chlorhexidine Gluconate Cloth  6 each Topical Daily  . docusate sodium  100 mg Oral BID  . enoxaparin (LOVENOX) injection  1 mg/kg Subcutaneous Q12H  . famotidine  20 mg Oral BID  . influenza vac split quadrivalent PF  0.5 mL Intramuscular Tomorrow-1000  . insulin aspart  0-20 Units Subcutaneous TID WC  . insulin aspart  0-5 Units Subcutaneous QHS  . insulin aspart  10 Units Subcutaneous TID WC  . insulin detemir  30 Units Subcutaneous BID  . pantoprazole  40 mg Oral Q1200  . predniSONE  50 mg Oral Daily  . rosuvastatin  40 mg Oral QHS  . tamsulosin  0.4 mg Oral Daily  . zinc sulfate  220 mg Oral Daily   Continuous Infusions:  PRN Meds:.guaiFENesin-dextromethorphan, [DISCONTINUED] ondansetron **OR** ondansetron (ZOFRAN) IV, polyethylene glycol, sodium chloride   Time Spent in minutes  35   See all Orders from today for further details   Lala Lund M.D on 09/09/2020 at 11:08 AM  To page go to [email or web address] - use universal password  [facility] -  Office  [phone]    Objective:   Vitals:   09/08/20 2000 09/08/20 2200 09/09/20 0010 09/09/20 0412  BP: 122/78 130/75 109/77 (!) 105/52  Pulse: 96 93 84 92  Resp: (!) 21 (!) 22 20 11   Temp: 98.2 F (36.8 C) 98.1 F (36.7 C) 98 F (36.7 C) 98 F (36.7 C)  TempSrc: Oral Oral Oral Oral  SpO2: 100% 96% 100% 94%  Weight:    (!) 168.1 kg   Height:        Wt Readings from Last 3 Encounters:  09/09/20 (!) 168.1 kg  07/16/20 (!) 184 kg  04/28/20 (!) 180.1 kg     Intake/Output Summary (Last 24 hours) at 09/09/2020 1108 Last data filed at 09/09/2020 0939 Gross per 24 hour  Intake 600 ml  Output 1700 ml  Net -1100 ml     Physical Exam  Awake Alert, No new F.N deficits, Normal affect [place].AT,PERRAL Supple Neck,No JVD, No cervical lymphadenopathy appriciated.  Symmetrical Chest wall movement, Good air movement  bilaterally, CTAB RRR,No Gallops, Rubs or new Murmurs, No Parasternal Heave +ve B.Sounds, Abd Soft, No tenderness, No organomegaly appriciated, No rebound - guarding or rigidity. No Cyanosis, Clubbing or edema, No new Rash or bruise    Data Review:    CBC Recent Labs  Lab 09/04/20 0452 09/04/20 0452 09/05/20 0243 09/06/20 0504 09/07/20 1111 09/08/20 0122 09/09/20 0223  WBC 15.3*   < > 12.9* 15.4* 18.3* 17.5* 15.3*  HGB 14.7   < > 14.2 14.6 14.4 13.6 13.3  HCT 43.9   < > 41.6 44.4 43.7 41.4 41.0  PLT 296   < > 295 336 465* 478* 431*  MCV 84.4   < > 84.7 84.7 86.0 85.0 85.8  MCH 28.3   < > 28.9 27.9 28.3 27.9 27.8  MCHC 33.5   < > 34.1 32.9 33.0 32.9 32.4  RDW 12.6   < > 12.6 12.5 12.8 12.6 12.5  LYMPHSABS 1.5  --   --  1.3 0.9 2.0 1.4  MONOABS 0.8  --   --  0.5 0.8 1.1* 1.1*  EOSABS 0.0  --   --  0.2 0.1 0.1 0.1  BASOSABS 0.0  --   --  0.0 0.0 0.0 0.0   < > = values in this interval not displayed.    Chemistries  Recent Labs  Lab 09/05/20 0243 09/06/20 0504 09/07/20 1111 09/08/20 0122 09/09/20 0223  NA 136 136 131* 136 134*  K 4.2 4.1 4.3 4.2 4.6  CL 100 99 98 100 99  CO2 28 27 23 26 26   GLUCOSE 123* 121* 200* 64* 103*  BUN 29* 30* 28* 31* 29*  CREATININE 1.76* 1.65* 1.60* 1.74* 1.65*  CALCIUM 8.4* 8.5* 8.3* 8.7* 8.6*  MG  --  2.4 2.5* 2.6* 2.5*  AST 25 20 25 25 22   ALT 24 23 24 26 27   ALKPHOS 53 57 60 61 59  BILITOT 0.6 0.9 0.8 0.4 0.6    ------------------------------------------------------------------------------------------------------------------ No results for input(s): CHOL, HDL, LDLCALC, TRIG, CHOLHDL, LDLDIRECT in the last 72 hours.  Lab Results  Component Value Date   HGBA1C 7.9 (H) 08/29/2020   ------------------------------------------------------------------------------------------------------------------ No results for input(s): TSH, T4TOTAL, T3FREE, THYROIDAB in the last 72 hours.  Invalid input(s): FREET3 ------------------------------------------------------------------------------------------------------------------ No results for input(s): VITAMINB12, FOLATE, FERRITIN, TIBC, IRON, RETICCTPCT in the last 72 hours.  Coagulation profile No results for input(s): INR, PROTIME in the last 168 hours.  Recent Labs    09/08/20 0122 09/09/20 0223  DDIMER 2.33* 2.47*    Cardiac Enzymes No results for input(s): CKMB, TROPONINI, MYOGLOBIN in the last 168 hours.  Invalid input(s): CK ------------------------------------------------------------------------------------------------------------------    Component Value Date/Time   BNP 32.1 09/09/2020 0223    Micro Results Recent Results (from the past 240 hour(s))  MRSA PCR Screening     Status: None   Collection Time: 08/22/2020  6:05 PM   Specimen: Nasopharyngeal  Result Value Ref Range Status   MRSA by PCR NEGATIVE NEGATIVE Final    Comment:        The GeneXpert MRSA Assay (FDA approved for NASAL specimens only), is one component of a comprehensive MRSA colonization surveillance program. It is not intended to diagnose MRSA infection nor to guide or monitor treatment for MRSA infections. Performed at [facility], [place] [address] [place], [place] [address]     Radiology Reports DG Chest Mission 1 View  Result Date: 09/09/2020 CLINICAL DATA:  Shortness of breath, COVID positive EXAM: PORTABLE CHEST 1  VIEW COMPARISON:  09/07/2020  FINDINGS: Low lung volumes. Persistent bilateral opacities with similar lung aeration no significant pleural effusion. No pneumothorax. Stable cardiomediastinal contours. IMPRESSION: Persistent extensive bilateral opacities with similar lung aeration. Electronically Signed   By: Macy Mis M.D.   On: 09/09/2020 08:06   DG Chest Port 1 View  Result Date: 09/07/2020 CLINICAL DATA:  COVID, hypoxia EXAM: PORTABLE CHEST 1 VIEW COMPARISON:  09/05/2020 FINDINGS: Extensive airspace disease bilaterally, worsening since prior study. Low lung volumes. No effusions or pneumothorax. IMPRESSION: Worsening bilateral airspace disease. Electronically Signed   By: Rolm Baptise M.D.   On: 09/07/2020 08:41   DG Chest Port 1 View  Result Date: 09/05/2020 CLINICAL DATA:  Shortness of breath and COVID-19 positivity EXAM: PORTABLE CHEST 1 VIEW COMPARISON:  09/02/2020 FINDINGS: Cardiac shadow is stable. Diffuse bilateral airspace opacities are again identified slightly improved when compared with the prior exam. No new focal abnormality is noted. No bony abnormality is seen. IMPRESSION: Slight improvement in bilateral airspace disease as described. Electronically Signed   By: Inez Catalina M.D.   On: 09/05/2020 09:11   DG Chest Port 1 View  Result Date: 09/02/2020 CLINICAL DATA:  Respiratory failure EXAM: PORTABLE CHEST 1 VIEW COMPARISON:  09/01/2020 FINDINGS: The lungs are symmetrically expanded. There is interval progression of asymmetric mid and lower lung zone pulmonary infiltrate, likely infectious or inflammatory in nature. No pneumothorax or pleural effusion. Cardiac size within normal limits. The pulmonary vascularity is normal. IMPRESSION: Progressive multifocal pulmonary infiltrate, likely infectious or inflammatory. Electronically Signed   By: Fidela Salisbury MD   On: 09/02/2020 06:28   DG Chest Port 1 View  Result Date: 08/16/2020 CLINICAL DATA:  Shortness of breath. EXAM: PORTABLE CHEST 1 VIEW COMPARISON:   None. FINDINGS: Cardiomediastinal silhouette is accentuated by low lung volumes and AP portable technique. Low lung volumes with bilateral peripheral predominant airspace opacities. No visible pleural effusions or pneumothorax. No acute osseous abnormality. IMPRESSION: Low lung volumes with bilateral peripheral predominant airspace opacities, concerning for multifocal pneumonia. Recommend correlation with COVID testing. Electronically Signed   By: Margaretha Sheffield MD   On: 08/12/2020 11:59   DG Chest Port 1V same Day  Result Date: 09/01/2020 CLINICAL DATA:  Respiratory failure. Shortness of breath. History of COVID. EXAM: PORTABLE CHEST 1 VIEW COMPARISON:  08/23/2020. FINDINGS: Cardiomegaly. Diffuse bilateral pulmonary infiltrates/edema again noted. Low lung volumes. No pleural effusion or pneumothorax. No acute bony abnormality. IMPRESSION: Cardiomegaly. Diffuse bilateral pulmonary infiltrates/edema again noted. Congestive heart failure and or bilateral pneumonia could present in this fashion. Low lung volumes. Electronically Signed   By: Marcello Moores  Register   On: 09/01/2020 09:29   VAS [place] LOWER EXTREMITY VENOUS (DVT)  Result Date: 09/05/2020  Lower Venous DVT Study Indications: Elevated d-dimer, Covid.  Comparison Study: No prior studies. Performing Technologist: Darlin Coco, RDMS  Examination Guidelines: A complete evaluation includes B-mode imaging, spectral Doppler, color Doppler, and power Doppler as needed of all accessible portions of each vessel. Bilateral testing is considered an integral part of a complete examination. Limited examinations for reoccurring indications may be performed as noted. The reflux portion of the exam is performed with the patient in reverse Trendelenburg.  +---------+---------------+---------+-----------+----------+--------------+ RIGHT    CompressibilityPhasicitySpontaneityPropertiesThrombus Aging  +---------+---------------+---------+-----------+----------+--------------+ CFV      Full           Yes      Yes                                 +---------+---------------+---------+-----------+----------+--------------+  SFJ      Full                                                        +---------+---------------+---------+-----------+----------+--------------+ FV Prox  Full                                                        +---------+---------------+---------+-----------+----------+--------------+ FV Mid   Full                                                        +---------+---------------+---------+-----------+----------+--------------+ FV DistalFull                                                        +---------+---------------+---------+-----------+----------+--------------+ PFV      Full                                                        +---------+---------------+---------+-----------+----------+--------------+ POP      Full           Yes      Yes                                 +---------+---------------+---------+-----------+----------+--------------+ PTV      Full                                                        +---------+---------------+---------+-----------+----------+--------------+ PERO     Full                                                        +---------+---------------+---------+-----------+----------+--------------+   +---------+---------------+---------+-----------+----------+--------------+ LEFT     CompressibilityPhasicitySpontaneityPropertiesThrombus Aging +---------+---------------+---------+-----------+----------+--------------+ CFV      Full           Yes      Yes                                 +---------+---------------+---------+-----------+----------+--------------+ SFJ      Full                                                         +---------+---------------+---------+-----------+----------+--------------+  FV Prox  Full                                                        +---------+---------------+---------+-----------+----------+--------------+ FV Mid   Full                                                        +---------+---------------+---------+-----------+----------+--------------+ FV DistalFull                                                        +---------+---------------+---------+-----------+----------+--------------+ PFV      Full                                                        +---------+---------------+---------+-----------+----------+--------------+ POP      Full           Yes      Yes                                 +---------+---------------+---------+-----------+----------+--------------+ PTV      Full                                                        +---------+---------------+---------+-----------+----------+--------------+ PERO     Full                                                        +---------+---------------+---------+-----------+----------+--------------+     Summary: RIGHT: - There is no evidence of deep vein thrombosis in the lower extremity.  - No cystic structure found in the popliteal fossa.  LEFT: - There is no evidence of deep vein thrombosis in the lower extremity.  - No cystic structure found in the popliteal fossa.  *See table(s) above for measurements and observations. Electronically signed by Harold Barban MD on 09/05/2020 at 5:31:25 PM.    Final    ECHOCARDIOGRAM LIMITED  Result Date: 08/31/2020    ECHOCARDIOGRAM LIMITED REPORT   Patient Name:   Robert [name] Date of Exam: 08/31/2020 Medical Rec #:  [id number]       Height:       73.0 in Accession #:    [id number]      Weight:       364.2 lb Date of Birth:  Feb 01, 1956       BSA:          2.776 m Patient Age:    49 years  BP:           142/86 mmHg Patient Gender: M                HR:           101 bpm. Exam Location:  Inpatient Procedure: Limited Echo, Cardiac Doppler, Color Doppler and Intracardiac            Opacification Agent Indications:    Chest Pain 786.50/ R07.9  History:        Patient has no prior history of Echocardiogram examinations.                 Risk Factors:Hypertension, Diabetes and Non-Smoker. GERD.  Sonographer:    Vickie Epley RDCS Referring Phys: [id number] [facility] CHAND  Sonographer Comments: Covid positive. IMPRESSIONS  1. Left ventricular ejection fraction, by estimation, is 65 to 70%. The left ventricle has normal function. The left ventricle has no regional wall motion abnormalities. There is mild left ventricular hypertrophy. Left ventricular diastolic parameters are consistent with Grade I diastolic dysfunction (impaired relaxation).  2. Right ventricule is not well visualized but grossly normal size and systolic function. Tricuspid regurgitation signal is inadequate for assessing PA pressure.  3. The mitral valve is normal in structure. No evidence of mitral valve regurgitation.  4. The aortic valve is tricuspid. Aortic valve regurgitation is not visualized. No aortic stenosis is present.  5. Aortic dilatation noted. There is mild dilatation of the ascending aorta, measuring 39 mm. FINDINGS  Left Ventricle: Left ventricular ejection fraction, by estimation, is 65 to 70%. The left ventricle has normal function. The left ventricle has no regional wall motion abnormalities. Definity contrast agent was given IV to delineate the left ventricular  endocardial borders. The left ventricular internal cavity size was normal in size. There is mild left ventricular hypertrophy. Left ventricular diastolic parameters are consistent with Grade I diastolic dysfunction (impaired relaxation). Right Ventricle: The right ventricular size is not well visualized. Right ventricular systolic function was not well visualized. Tricuspid regurgitation signal is inadequate for assessing PA  pressure. Left Atrium: Left atrial size was normal in size. Pericardium: There is no evidence of pericardial effusion. Mitral Valve: The mitral valve is normal in structure. Tricuspid Valve: The tricuspid valve is normal in structure. Tricuspid valve regurgitation is not demonstrated. Aortic Valve: The aortic valve is tricuspid. Aortic valve regurgitation is not visualized. No aortic stenosis is present. Pulmonic Valve: The pulmonic valve was not well visualized. Pulmonic valve regurgitation is not visualized. Aorta: The aortic root is normal in size and structure and aortic dilatation noted. There is mild dilatation of the ascending aorta, measuring 39 mm. IAS/Shunts: The interatrial septum was not well visualized. LEFT VENTRICLE PLAX 2D LVIDd:         4.60 cm      Diastology LVIDs:         3.40 cm      LV e' medial:    6.42 cm/s LV PW:         1.10 cm      LV E/e' medial:  11.2 LV IVS:        1.10 cm      LV e' lateral:   9.25 cm/s LVOT diam:     2.60 cm      LV E/e' lateral: 7.7 LV SV:         106 LV SV Index:   38 LVOT Area:     5.31 cm  LV Volumes (MOD) LV vol  d, MOD A2C: 91.9 ml LV vol d, MOD A4C: 116.0 ml LV vol s, MOD A2C: 21.5 ml LV vol s, MOD A4C: 37.8 ml LV SV MOD A2C:     70.4 ml LV SV MOD A4C:     116.0 ml LV SV MOD BP:      72.2 ml LEFT ATRIUM           Index LA diam:      4.10 cm 1.48 cm/m LA Vol (A4C): 46.1 ml 16.61 ml/m  AORTIC VALVE LVOT Vmax:   108.00 cm/s LVOT Vmean:  70.200 cm/s LVOT VTI:    0.199 m  AORTA Ao Root diam: 3.80 cm Ao Asc diam:  3.90 cm MITRAL VALVE MV Area (PHT): 5.62 cm    SHUNTS MV Decel Time: 135 msec    Systemic VTI:  0.20 m MV E velocity: 71.60 cm/s  Systemic Diam: 2.60 cm MV A velocity: 90.00 cm/s MV E/A ratio:  0.80 Oswaldo Milian MD Electronically signed by Oswaldo Milian MD Signature Date/Time: 08/31/2020/12:03:23 PM    Final

## 2020-09-09 NOTE — TOC Initial Note (Signed)
Transition of Care [facility]) - Initial/Assessment Note    Patient Details  Name: Robert [name] MRN: [id number] Date of Birth: [birth year]  Transition [facility]) CM/SW Contact:    Bethena Roys, RN Phone Number: 09/09/2020, 4:39 PM  Clinical Narrative: Case Manager received consult for Eliquis Card- to provide a 30 day free. Benefits check submitted for cost and 30 day free card will be placed in the shadow chart for this patient. The patient uses [employer]. Pt is from home with the support of wife. She is currently out of work for [employer] and will return to work in Jan. Wife states all of the bedrooms are upstairs and the pt would have to walk up 13 steps to get upstairs to his bedroom. Case Manager asked if patient would be able to sleep downstairs and they dont have an extra bed that can be moved downstairs or a convertible couch. Wife will be the only support in the home. Case Manager did ask wife to have someone in the home to assist the patient in the house on the day of discharge. We discussed home oxygen- wife is agreeable to [employer]-referral made and company will deliver a  concentrator to the home with 70 ft tubing for pt to move around and a portable tank to the room prior to transition home. Order will need to be submitted in Epic for a rolling walker. Wife is agreeable to home health services via [facility] for [facility] PT/OT If co pay is too expensive- wife will do PT only. Referral made and start of care to begin within 24-48 hours post transition home. Wife had concerns regarding patient not being able to return to work- unable to receive unemployment via his job. Case Manager received help from the Clinical Social Worker regarding the Care [phone] to see if this agency could be of assistance for the patient. Case Manager will continue to monitor for additional transition of care needs.          Expected Discharge Plan: [place] Barriers to  Discharge: Continued Medical Work up   Patient Goals and CMS Choice Patient states their goals for this hospitalization and ongoing recovery are:: to return home CMS Medicare.gov Compare Post Acute Care list provided to:: Patient Choice offered to / list presented to : Patient, Spouse (Received verbal permission to contact wife.)  Expected Discharge Plan and Services Expected Discharge Plan: [place] In-house Referral: NA Discharge Planning Services: CM Consult Post Acute Care Choice: Durable Medical Equipment, Home Health Living arrangements for the past 2 months: Single Family Home                 DME Arranged: Walker rolling, Oxygen DME Agency: [employer] ([employer] is the new name of the company) Date DME Agency Contacted: 09/09/20 Time DME Agency Contacted: [phone] Representative spoke with at DME Agency: Brenton Grills HH Arranged: PT [place]: [place] Date [place]: 09/09/20 Time [place]: [id number] Representative spoke with at [place]: Tommi Rumps  Prior Living Arrangements/Services Living arrangements for the past 2 months: [place] with:: Self, Spouse Patient language and need for interpreter reviewed:: Yes Do you feel safe going back to the place where you live?: Yes      Need for Family Participation in Patient Care: Yes (Comment) Care giver support system in place?: Yes (comment)   Criminal Activity/Legal Involvement Pertinent to Current Situation/Hospitalization: No - Comment  as needed  Activities of Daily Living Home Assistive Devices/Equipment: None ADL Screening (condition at time of admission) Patient's cognitive ability adequate to safely complete daily activities?: Yes Is the patient deaf or have difficulty hearing?: No Does the patient have difficulty seeing, even when wearing glasses/contacts?: No Does the patient have difficulty concentrating, remembering, or making decisions?: No Patient  able to express need for assistance with ADLs?: Yes Does the patient have difficulty dressing or bathing?: No Independently performs ADLs?: Yes (appropriate for developmental age) Does the patient have difficulty walking or climbing stairs?: No Weakness of Legs: None Weakness of Arms/Hands: None  Permission Sought/Granted Permission sought to share information with : Family Supports       Permission granted to share info w AGENCY: Alvis Lemmings        Emotional Assessment Appearance:: Appears stated age Attitude/Demeanor/Rapport: Engaged Affect (typically observed): Appropriate Orientation: : Oriented to Place, Oriented to  Time, Oriented to Situation Alcohol / Substance Use: Not Applicable Psych Involvement: No (comment)  Admission diagnosis:  Acute hypoxemic respiratory failure (HCC) [J96.01] Acute respiratory failure due to COVID-19 ([place]) [U07.1, J96.00] Pneumonia due to COVID-19 virus [U07.1, J12.82] Acute respiratory failure due to severe acute respiratory syndrome coronavirus 2 (SARS-CoV-2) infection ([place]) [U07.1, J96.00] Patient Active Problem List   Diagnosis Date Noted  . Pneumonia due to COVID-19 virus 08/16/2020  . Acute hypoxemic respiratory failure ([place]) 08/31/2020  . Acute respiratory failure due to COVID-19 ([place]) 08/19/2020  . Acute respiratory failure due to severe acute respiratory syndrome coronavirus 2 (SARS-CoV-2) infection ([place]) 08/29/2020  . Uncontrolled type 2 diabetes mellitus with hyperglycemia ([place]) 07/16/2019  . Essential hypertension, benign 07/16/2019  . Mixed hyperlipidemia 07/16/2019  . Morbid obesity ([place]) 07/16/2019  . Aortitis ([place]) 12/25/2017   PCP:  Doree Albee, MD Pharmacy:   [facility], [address] [place] [address] Phone: [phone] Fax: [phone]     Social Determinants of Health (SDOH) Interventions    Readmission Risk Interventions No flowsheet data found.

## 2020-09-09 NOTE — Progress Notes (Signed)
Patient last yellow mews score was 17:02pm 09/08/20 during the previous shift. Completed two q2 hour vital sign assessments. After which the patient had 3 consecutive green mews scores. Vital signs are being taken q 4 hours.

## 2020-09-09 NOTE — Progress Notes (Signed)
Hypoglycemic Event  CBG: 62  Treatment: 4 oz juice/soda  Symptoms: None  Follow-up CBG: [id number] CBG Result: 128  Possible Reasons for Event: Unknown  Comments/MD notified: Barbarann Ehlers

## 2020-09-10 ENCOUNTER — Other Ambulatory Visit: Payer: Self-pay

## 2020-09-10 LAB — GLUCOSE, CAPILLARY
Glucose-Capillary: 130 mg/dL — ABNORMAL HIGH (ref 70–99)
Glucose-Capillary: 231 mg/dL — ABNORMAL HIGH (ref 70–99)
Glucose-Capillary: 248 mg/dL — ABNORMAL HIGH (ref 70–99)
Glucose-Capillary: 229 mg/dL — ABNORMAL HIGH (ref 70–99)

## 2020-09-10 MED ORDER — INSULIN ASPART 100 UNIT/ML ~~LOC~~ SOLN
8.0000 [IU] | Freq: Three times a day (TID) | SUBCUTANEOUS | Status: DC
  Administered 2020-09-10: 8 [IU] via SUBCUTANEOUS

## 2020-09-10 MED ORDER — INSULIN ASPART 100 UNIT/ML ~~LOC~~ SOLN
6.0000 [IU] | Freq: Three times a day (TID) | SUBCUTANEOUS | Status: DC
  Administered 2020-09-10 – 2020-09-13 (×8): 6 [IU] via SUBCUTANEOUS

## 2020-09-10 MED ORDER — PREDNISONE 20 MG PO TABS
20.0000 mg | ORAL_TABLET | Freq: Every day | ORAL | Status: DC
  Administered 2020-09-10 – 2020-09-11 (×2): 20 mg via ORAL
  Filled 2020-09-10 (×2): qty 1

## 2020-09-10 MED ORDER — INSULIN DETEMIR 100 UNIT/ML ~~LOC~~ SOLN
25.0000 [IU] | Freq: Two times a day (BID) | SUBCUTANEOUS | Status: DC
  Administered 2020-09-10 – 2020-09-11 (×4): 25 [IU] via SUBCUTANEOUS
  Filled 2020-09-10 (×6): qty 0.25

## 2020-09-10 NOTE — Progress Notes (Signed)
PROGRESS NOTE                                                                                                                                                                                                             Patient Demographics:    Robert [name], is a 64 y.o. male, DOB - [birth year], MLY:[id number]  Outpatient Primary MD for the patient is Doree Albee, MD   Admit date - 08/21/2020   LOS - 11  Chief Complaint  Patient presents with  . hxpoxia       Brief Narrative: Patient is a 64 y.o. male with PMHx of CKD stage IIIb, DM-2, HTN, morbid obesity, BPH-who was admitted by PCCM on 11/28 due to severe hypoxic respiratory failure due to COVID-19 pneumonia.   Patient apparently had attended a funeral-where he apparently may have been exposed to COVID-19 infection.  COVID-19 vaccinated status: Vaccinated (second Pfizer vaccine dose> 6 months back-no booster yet)  Significant Events: 11/28>> Admit to [facility] for severe hypoxia due to COVID-19 pneumonia  Significant studies: 11/28>>Chest x-ray: Bilateral airspace opacities 11/29>> Echo: EF 81-27%, grade 1 diastolic dysfunction. 11/30>> chest x-ray: Diffuse bilateral pulmonary infiltrates. 12/1>> chest x-ray: Progressive multifocal pulmonary infiltrates.  COVID-19 medications: Steroids: 11/28>> Remdesivir: 11/28>> 12/2 Baricitinib: 11/28>>  Antibiotics: Rocephin: 11/28 x 1 Zithromax: 11/28 x 1  Microbiology data: 11/28 >>blood culture: No growth  Procedures: None  Consults: None  DVT prophylaxis: Prophylactic Lovenox    Subjective:   Patient in bed, appears comfortable, denies any headache, no fever, no chest pain or pressure, mild shortness of breath , no abdominal pain. No focal weakness.    Assessment  & Plan :   Acute Hypoxic Resp Failure due to Covid 19 Viral pneumonia: he has moderate to severe parenchymal lung injury with breakthrough  infection, he received 2 doses of Pfizer vaccine and the second shot was 6 months ago.  Had not received his third shot.  Is been treated with combination of Steroids, Remdesivir and Baricitinib.  Is now showing signs of recovery.  Monitor closely.  Encouraged to sit up in chair use I-S and flutter valve for pulmonary toiletry.  He continues to improve likely discharge with home oxygen in the next 1 to 2 days.  O2 requirements:  SpO2: 100 % O2 Flow Rate (L/min): 3 L/min FiO2 (%): 100 %  Prone/Incentive Spirometry: encouraged patient to lie prone for 3-4 hours at a time for a total of 16 hours a day, and to encourage incentive spirometry use 3-4/hour.   Recent Labs  Lab 09/05/20 0243 09/06/20 0504 09/07/20 1111 09/07/20 1112 09/08/20 0122 09/09/20 0223  WBC 12.9* 15.4* 18.3*  --  17.5* 15.3*  HGB 14.2 14.6 14.4  --  13.6 13.3  HCT 41.6 44.4 43.7  --  41.4 41.0  PLT 295 336 465*  --  478* 431*  CRP 9.8* 11.4* 11.3*  --  9.7* 8.8*  BNP 45.6 35.6  --  43.2 26.0 32.1  DDIMER 3.66* 4.72* 3.56*  --  2.33* 2.47*  AST 25 20 25   --  25 22  ALT 24 23 24   --  26 27  ALKPHOS 53 57 60  --  61 59  BILITOT 0.6 0.9 0.8  --  0.4 0.6  ALBUMIN 2.4* 2.3* 2.4*  --  2.3* 2.2*     Elevated D-dimer: Secondary to COVID-19 related infection- -ve Doppler.  Switched him to therapeutic Lovenox on 09/05/2020 regardless as his D-dimer is creeping higher and he is extremely high risk for developing a DVT.  Will continue till D-dimer persistently stays below 2, D-dimer is now downtrending.  2 Weeks of prophylactic Eliquis upon discharge.  AKI on CKD stage IIIb: AKI likely hemodynamically mediated-improving with supportive care.  Follow electrolytes closely. Baseline creatinine seems to be close to 1.7.  Mild incidental ascending aortic dilation on echocardiogram.  Follow with PCP and cardiology in 2 to 3 weeks post discharge.    HTN: BP controlled-continue amlodipine.  HLD: Continue statin  BPH: Continue  Flomax  Presumed OSA: On HFNC - we will need outpatient sleep study.  Morbid obesity - BMI 50, follow with PCP.  DM-2 (A1c 7.9 on 11/28) with uncontrolled hyperglycemia due to steroids: CBGs improving-Insulin adjusted 09/10/20 due to steroid taper..  Recent Labs    09/05/20 1650 09/05/20 2038 09/06/20 0822  GLUCAP 228* 227* 166*      GI prophylaxis: PPI  Condition - Fair  Family Communication  :  Previous MD  - Spouse Briscoe Burns [phone])  on 09/03/20, 09/06/20, 09/10/20   Code Status :  Full Code  Diet :  Diet Order            Diet Carb Modified Fluid consistency: Thin; Room service appropriate? Yes  Diet effective now                  Disposition Plan  :  Status is: Inpatient  Remains inpatient appropriate because:Inpatient level of care appropriate due to severity of illness  Dispo: The patient is from: Home              Anticipated d/c is to: Home              Anticipated d/c date is: > 3 days              Patient currently is not medically stable to d/c.   Barriers to discharge: Hypoxia requiring O2 supplementation/complete 5 days of IV Remdesivir  Antimicorbials  :    Anti-infectives (From admission, onward)   Start     Dose/Rate Route Frequency Ordered Stop   08/31/20 1000  remdesivir 100 mg in sodium chloride 0.9 % 100 mL IVPB  Status:  Discontinued       "Followed by" Linked Group Details   100 mg 200 mL/hr over 30 Minutes Intravenous Daily 08/31/2020  2023 08/10/2020 2113   08/31/20 1000  remdesivir 100 mg in sodium chloride 0.9 % 100 mL IVPB        100 mg 200 mL/hr over 30 Minutes Intravenous Daily 08/21/2020 1428 09/03/20 1057   08/06/2020 2022  remdesivir 200 mg in sodium chloride 0.9% 250 mL IVPB  Status:  Discontinued       "Followed by" Linked Group Details   200 mg 580 mL/hr over 30 Minutes Intravenous Once 08/05/2020 2023 08/09/2020 2113   08/07/2020 1430  remdesivir 100 mg in sodium chloride 0.9 % 100 mL IVPB        100 mg 200 mL/hr over 30  Minutes Intravenous Every 30 min 08/20/2020 1428 08/16/2020 1639   08/08/2020 1145  cefTRIAXone (ROCEPHIN) 2 g in sodium chloride 0.9 % 100 mL IVPB        2 g 200 mL/hr over 30 Minutes Intravenous  Once 08/27/2020 1136 08/23/2020 1409   08/31/2020 1145  azithromycin (ZITHROMAX) 500 mg in sodium chloride 0.9 % 250 mL IVPB  Status:  Discontinued        500 mg 250 mL/hr over 60 Minutes Intravenous Every 24 hours 08/09/2020 1136 08/31/20 0853      Inpatient Medications  Scheduled Meds: . amLODipine  10 mg Oral Daily  . vitamin C  500 mg Oral Daily  . baricitinib  2 mg Oral Daily  . Chlorhexidine Gluconate Cloth  6 each Topical Daily  . docusate sodium  100 mg Oral BID  . enoxaparin (LOVENOX) injection  1 mg/kg Subcutaneous Q12H  . famotidine  20 mg Oral BID  . influenza vac split quadrivalent PF  0.5 mL Intramuscular Tomorrow-1000  . insulin aspart  0-20 Units Subcutaneous TID WC  . insulin aspart  0-5 Units Subcutaneous QHS  . insulin aspart  6 Units Subcutaneous TID WC  . insulin detemir  25 Units Subcutaneous BID  . pantoprazole  40 mg Oral Q1200  . predniSONE  20 mg Oral Daily  . rosuvastatin  40 mg Oral QHS  . tamsulosin  0.4 mg Oral Daily  . zinc sulfate  220 mg Oral Daily   Continuous Infusions:  PRN Meds:.guaiFENesin-dextromethorphan, lip balm, [DISCONTINUED] ondansetron **OR** ondansetron (ZOFRAN) IV, polyethylene glycol, sodium chloride   Time Spent in minutes  35   See all Orders from today for further details   Lala Lund M.D on 09/10/2020 at 9:38 AM  To page go to [email or web address] - use universal password  [facility] -  Office  [phone]    Objective:   Vitals:   09/09/20 1800 09/09/20 2004 09/10/20 0624 09/10/20 0626  BP:  120/69  140/72  Pulse:  98 73 78  Resp:  (!) 22 (!) 0 (!) 22  Temp:  97.6 F (36.4 C)  98.3 F (36.8 C)  TempSrc:  Oral  Oral  SpO2: 97% 92% 100% 100%  Weight:   (!) 169.9 kg   Height:        Wt Readings from Last 3 Encounters:   09/10/20 (!) 169.9 kg  07/16/20 (!) 184 kg  04/28/20 (!) 180.1 kg     Intake/Output Summary (Last 24 hours) at 09/10/2020 0938 Last data filed at 09/09/2020 2100 Gross per 24 hour  Intake 820 ml  Output 550 ml  Net 270 ml     Physical Exam  Awake Alert, No new F.N deficits, Normal affect [place].AT,PERRAL Supple Neck,No JVD, No cervical lymphadenopathy appriciated.  Symmetrical Chest wall movement, Good air movement bilaterally, CTAB RRR,No  Gallops, Rubs or new Murmurs, No Parasternal Heave +ve B.Sounds, Abd Soft, No tenderness, No organomegaly appriciated, No rebound - guarding or rigidity. No Cyanosis, Clubbing or edema, No new Rash or bruise     Data Review:    CBC Recent Labs  Lab 09/04/20 0452 09/05/20 0243 09/06/20 0504 09/07/20 1111 09/08/20 0122 09/09/20 0223  WBC 15.3* 12.9* 15.4* 18.3* 17.5* 15.3*  HGB 14.7 14.2 14.6 14.4 13.6 13.3  HCT 43.9 41.6 44.4 43.7 41.4 41.0  PLT 296 295 336 465* 478* 431*  MCV 84.4 84.7 84.7 86.0 85.0 85.8  MCH 28.3 28.9 27.9 28.3 27.9 27.8  MCHC 33.5 34.1 32.9 33.0 32.9 32.4  RDW 12.6 12.6 12.5 12.8 12.6 12.5  LYMPHSABS 1.5  --  1.3 0.9 2.0 1.4  MONOABS 0.8  --  0.5 0.8 1.1* 1.1*  EOSABS 0.0  --  0.2 0.1 0.1 0.1  BASOSABS 0.0  --  0.0 0.0 0.0 0.0    Chemistries  Recent Labs  Lab 09/05/20 0243 09/06/20 0504 09/07/20 1111 09/08/20 0122 09/09/20 0223  NA 136 136 131* 136 134*  K 4.2 4.1 4.3 4.2 4.6  CL 100 99 98 100 99  CO2 28 27 23 26 26   GLUCOSE 123* 121* 200* 64* 103*  BUN 29* 30* 28* 31* 29*  CREATININE 1.76* 1.65* 1.60* 1.74* 1.65*  CALCIUM 8.4* 8.5* 8.3* 8.7* 8.6*  MG  --  2.4 2.5* 2.6* 2.5*  AST 25 20 25 25 22   ALT 24 23 24 26 27   ALKPHOS 53 57 60 61 59  BILITOT 0.6 0.9 0.8 0.4 0.6   ------------------------------------------------------------------------------------------------------------------ No results for input(s): CHOL, HDL, LDLCALC, TRIG, CHOLHDL, LDLDIRECT in the last 72 hours.  Lab Results   Component Value Date   HGBA1C 7.9 (H) 08/28/2020   ------------------------------------------------------------------------------------------------------------------ No results for input(s): TSH, T4TOTAL, T3FREE, THYROIDAB in the last 72 hours.  Invalid input(s): FREET3 ------------------------------------------------------------------------------------------------------------------ No results for input(s): VITAMINB12, FOLATE, FERRITIN, TIBC, IRON, RETICCTPCT in the last 72 hours.  Coagulation profile No results for input(s): INR, PROTIME in the last 168 hours.  Recent Labs    09/08/20 0122 09/09/20 0223  DDIMER 2.33* 2.47*    Cardiac Enzymes No results for input(s): CKMB, TROPONINI, MYOGLOBIN in the last 168 hours.  Invalid input(s): CK ------------------------------------------------------------------------------------------------------------------    Component Value Date/Time   BNP 32.1 09/09/2020 0223    Micro Results No results found for this or any previous visit (from the past 240 hour(s)).  Radiology Reports DG Chest Port 1 View  Result Date: 09/09/2020 CLINICAL DATA:  Shortness of breath, COVID positive EXAM: PORTABLE CHEST 1 VIEW COMPARISON:  09/07/2020 FINDINGS: Low lung volumes. Persistent bilateral opacities with similar lung aeration no significant pleural effusion. No pneumothorax. Stable cardiomediastinal contours. IMPRESSION: Persistent extensive bilateral opacities with similar lung aeration. Electronically Signed   By: Macy Mis M.D.   On: 09/09/2020 08:06   DG Chest Port 1 View  Result Date: 09/07/2020 CLINICAL DATA:  COVID, hypoxia EXAM: PORTABLE CHEST 1 VIEW COMPARISON:  09/05/2020 FINDINGS: Extensive airspace disease bilaterally, worsening since prior study. Low lung volumes. No effusions or pneumothorax. IMPRESSION: Worsening bilateral airspace disease. Electronically Signed   By: Rolm Baptise M.D.   On: 09/07/2020 08:41   DG Chest Port 1  View  Result Date: 09/05/2020 CLINICAL DATA:  Shortness of breath and COVID-19 positivity EXAM: PORTABLE CHEST 1 VIEW COMPARISON:  09/02/2020 FINDINGS: Cardiac shadow is stable. Diffuse bilateral airspace opacities are again identified slightly improved when compared with  the prior exam. No new focal abnormality is noted. No bony abnormality is seen. IMPRESSION: Slight improvement in bilateral airspace disease as described. Electronically Signed   By: Inez Catalina M.D.   On: 09/05/2020 09:11   DG Chest Port 1 View  Result Date: 09/02/2020 CLINICAL DATA:  Respiratory failure EXAM: PORTABLE CHEST 1 VIEW COMPARISON:  09/01/2020 FINDINGS: The lungs are symmetrically expanded. There is interval progression of asymmetric mid and lower lung zone pulmonary infiltrate, likely infectious or inflammatory in nature. No pneumothorax or pleural effusion. Cardiac size within normal limits. The pulmonary vascularity is normal. IMPRESSION: Progressive multifocal pulmonary infiltrate, likely infectious or inflammatory. Electronically Signed   By: Fidela Salisbury MD   On: 09/02/2020 06:28   DG Chest Port 1 View  Result Date: 08/04/2020 CLINICAL DATA:  Shortness of breath. EXAM: PORTABLE CHEST 1 VIEW COMPARISON:  None. FINDINGS: Cardiomediastinal silhouette is accentuated by low lung volumes and AP portable technique. Low lung volumes with bilateral peripheral predominant airspace opacities. No visible pleural effusions or pneumothorax. No acute osseous abnormality. IMPRESSION: Low lung volumes with bilateral peripheral predominant airspace opacities, concerning for multifocal pneumonia. Recommend correlation with COVID testing. Electronically Signed   By: Margaretha Sheffield MD   On: 08/11/2020 11:59   DG Chest Port 1V same Day  Result Date: 09/01/2020 CLINICAL DATA:  Respiratory failure. Shortness of breath. History of COVID. EXAM: PORTABLE CHEST 1 VIEW COMPARISON:  08/20/2020. FINDINGS: Cardiomegaly. Diffuse bilateral  pulmonary infiltrates/edema again noted. Low lung volumes. No pleural effusion or pneumothorax. No acute bony abnormality. IMPRESSION: Cardiomegaly. Diffuse bilateral pulmonary infiltrates/edema again noted. Congestive heart failure and or bilateral pneumonia could present in this fashion. Low lung volumes. Electronically Signed   By: Marcello Moores  Register   On: 09/01/2020 09:29   VAS [place] LOWER EXTREMITY VENOUS (DVT)  Result Date: 09/05/2020  Lower Venous DVT Study Indications: Elevated d-dimer, Covid.  Comparison Study: No prior studies. Performing Technologist: Darlin Coco, RDMS  Examination Guidelines: A complete evaluation includes B-mode imaging, spectral Doppler, color Doppler, and power Doppler as needed of all accessible portions of each vessel. Bilateral testing is considered an integral part of a complete examination. Limited examinations for reoccurring indications may be performed as noted. The reflux portion of the exam is performed with the patient in reverse Trendelenburg.  +---------+---------------+---------+-----------+----------+--------------+ RIGHT    CompressibilityPhasicitySpontaneityPropertiesThrombus Aging +---------+---------------+---------+-----------+----------+--------------+ CFV      Full           Yes      Yes                                 +---------+---------------+---------+-----------+----------+--------------+ SFJ      Full                                                        +---------+---------------+---------+-----------+----------+--------------+ FV Prox  Full                                                        +---------+---------------+---------+-----------+----------+--------------+ FV Mid   Full                                                        +---------+---------------+---------+-----------+----------+--------------+  FV DistalFull                                                         +---------+---------------+---------+-----------+----------+--------------+ PFV      Full                                                        +---------+---------------+---------+-----------+----------+--------------+ POP      Full           Yes      Yes                                 +---------+---------------+---------+-----------+----------+--------------+ PTV      Full                                                        +---------+---------------+---------+-----------+----------+--------------+ PERO     Full                                                        +---------+---------------+---------+-----------+----------+--------------+   +---------+---------------+---------+-----------+----------+--------------+ LEFT     CompressibilityPhasicitySpontaneityPropertiesThrombus Aging +---------+---------------+---------+-----------+----------+--------------+ CFV      Full           Yes      Yes                                 +---------+---------------+---------+-----------+----------+--------------+ SFJ      Full                                                        +---------+---------------+---------+-----------+----------+--------------+ FV Prox  Full                                                        +---------+---------------+---------+-----------+----------+--------------+ FV Mid   Full                                                        +---------+---------------+---------+-----------+----------+--------------+ FV DistalFull                                                        +---------+---------------+---------+-----------+----------+--------------+  PFV      Full                                                        +---------+---------------+---------+-----------+----------+--------------+ POP      Full           Yes      Yes                                  +---------+---------------+---------+-----------+----------+--------------+ PTV      Full                                                        +---------+---------------+---------+-----------+----------+--------------+ PERO     Full                                                        +---------+---------------+---------+-----------+----------+--------------+     Summary: RIGHT: - There is no evidence of deep vein thrombosis in the lower extremity.  - No cystic structure found in the popliteal fossa.  LEFT: - There is no evidence of deep vein thrombosis in the lower extremity.  - No cystic structure found in the popliteal fossa.  *See table(s) above for measurements and observations. Electronically signed by Harold Barban MD on 09/05/2020 at 5:31:25 PM.    Final    ECHOCARDIOGRAM LIMITED  Result Date: 08/31/2020    ECHOCARDIOGRAM LIMITED REPORT   Patient Name:   Robert [name] Date of Exam: 08/31/2020 Medical Rec #:  [id number]       Height:       73.0 in Accession #:    [id number]      Weight:       364.2 lb Date of Birth:  [birth year]       BSA:          2.776 m Patient Age:    36 years        BP:           142/86 mmHg Patient Gender: M               HR:           101 bpm. Exam Location:  Inpatient Procedure: Limited Echo, Cardiac Doppler, Color Doppler and Intracardiac            Opacification Agent Indications:    Chest Pain 786.50/ R07.9  History:        Patient has no prior history of Echocardiogram examinations.                 Risk Factors:Hypertension, Diabetes and Non-Smoker. GERD.  Sonographer:    Vickie Epley RDCS Referring Phys: [id number] [facility] CHAND  Sonographer Comments: Covid positive. IMPRESSIONS  1. Left ventricular ejection fraction, by estimation, is 65 to 70%. The left ventricle has normal function. The left ventricle has no regional wall motion abnormalities. There is mild left ventricular hypertrophy. Left ventricular diastolic parameters  are consistent with Grade I  diastolic dysfunction (impaired relaxation).  2. Right ventricule is not well visualized but grossly normal size and systolic function. Tricuspid regurgitation signal is inadequate for assessing PA pressure.  3. The mitral valve is normal in structure. No evidence of mitral valve regurgitation.  4. The aortic valve is tricuspid. Aortic valve regurgitation is not visualized. No aortic stenosis is present.  5. Aortic dilatation noted. There is mild dilatation of the ascending aorta, measuring 39 mm. FINDINGS  Left Ventricle: Left ventricular ejection fraction, by estimation, is 65 to 70%. The left ventricle has normal function. The left ventricle has no regional wall motion abnormalities. Definity contrast agent was given IV to delineate the left ventricular  endocardial borders. The left ventricular internal cavity size was normal in size. There is mild left ventricular hypertrophy. Left ventricular diastolic parameters are consistent with Grade I diastolic dysfunction (impaired relaxation). Right Ventricle: The right ventricular size is not well visualized. Right ventricular systolic function was not well visualized. Tricuspid regurgitation signal is inadequate for assessing PA pressure. Left Atrium: Left atrial size was normal in size. Pericardium: There is no evidence of pericardial effusion. Mitral Valve: The mitral valve is normal in structure. Tricuspid Valve: The tricuspid valve is normal in structure. Tricuspid valve regurgitation is not demonstrated. Aortic Valve: The aortic valve is tricuspid. Aortic valve regurgitation is not visualized. No aortic stenosis is present. Pulmonic Valve: The pulmonic valve was not well visualized. Pulmonic valve regurgitation is not visualized. Aorta: The aortic root is normal in size and structure and aortic dilatation noted. There is mild dilatation of the ascending aorta, measuring 39 mm. IAS/Shunts: The interatrial septum was not well visualized. LEFT VENTRICLE PLAX 2D  LVIDd:         4.60 cm      Diastology LVIDs:         3.40 cm      LV e' medial:    6.42 cm/s LV PW:         1.10 cm      LV E/e' medial:  11.2 LV IVS:        1.10 cm      LV e' lateral:   9.25 cm/s LVOT diam:     2.60 cm      LV E/e' lateral: 7.7 LV SV:         106 LV SV Index:   38 LVOT Area:     5.31 cm  LV Volumes (MOD) LV vol d, MOD A2C: 91.9 ml LV vol d, MOD A4C: 116.0 ml LV vol s, MOD A2C: 21.5 ml LV vol s, MOD A4C: 37.8 ml LV SV MOD A2C:     70.4 ml LV SV MOD A4C:     116.0 ml LV SV MOD BP:      72.2 ml LEFT ATRIUM           Index LA diam:      4.10 cm 1.48 cm/m LA Vol (A4C): 46.1 ml 16.61 ml/m  AORTIC VALVE LVOT Vmax:   108.00 cm/s LVOT Vmean:  70.200 cm/s LVOT VTI:    0.199 m  AORTA Ao Root diam: 3.80 cm Ao Asc diam:  3.90 cm MITRAL VALVE MV Area (PHT): 5.62 cm    SHUNTS MV Decel Time: 135 msec    Systemic VTI:  0.20 m MV E velocity: 71.60 cm/s  Systemic Diam: 2.60 cm MV A velocity: 90.00 cm/s MV E/A ratio:  0.80 Oswaldo Milian MD Electronically signed by  Oswaldo Milian MD Signature Date/Time: 08/31/2020/12:03:23 PM    Final

## 2020-09-10 NOTE — Progress Notes (Signed)
Physical Therapy Treatment Patient Details Name: Robert [name] MRN: [id number] DOB: 10-02-1956 Today's Date: 09/10/2020    History of Present Illness Pt is a 64 y/o male with a PMH significant for CKD stage IIIb, DM, HTN, morbid obesity, BPH. He was admitted 08/29/2020 with severe hypoxic respiratory failure 2 COVID-19 PNA. Vaccinated    PT Comments    On arrival, pt sats 89% on 3L. Stood x 1 with sats decreasing to 83% and pt sounding very "stuffy." Returned to sitting and pt given opportunity to blow his nose (unfortunately lead to left nares bleeding minimal amount--pt stopped with several minutes of pressure; RN made aware). After nose clear, he was able to stand x 1 minute with maintaining sats 88%. Seated rest and then walked x 50 ft with RW on 4L O2 with sats 90-94%. Cues for pacing, breathing, and use of RW throughout.   Patient expressing concern over climbing steps into his home. Will plan to bring single step to his room to begin stair training.    Follow Up Recommendations  Home health PT;Supervision/Assistance - 24 hour     Equipment Recommendations  Rolling walker with 5" wheels    Recommendations for Other Services       Precautions / Restrictions Precautions Precautions: None Precaution Comments: watch O2, tachycardia    Mobility  Bed Mobility                  Transfers Overall transfer level: Needs assistance Equipment used: Rolling walker (2 wheeled) Transfers: Sit to/from Stand Sit to Stand: Supervision         General transfer comment: supervision for safety, cues for technique and line management  Ambulation/Gait Ambulation/Gait assistance: Min guard Gait Distance (Feet): 50 Feet Assistive device: Rolling walker (2 wheeled) Gait Pattern/deviations: Step-through pattern;Decreased step length - right;Decreased step length - left;Shuffle;Trunk flexed Gait velocity: slowed   General Gait Details: close guarding with cues for breathing,  velocity, safe use of RW   Stairs             Wheelchair Mobility    Modified Rankin (Stroke Patients Only)       Balance Overall balance assessment: Needs assistance Sitting-balance support: Feet supported;No upper extremity supported Sitting balance-Leahy Scale: Good     Standing balance support: No upper extremity supported;Bilateral upper extremity supported;Single extremity supported Standing balance-Leahy Scale: Fair                              Cognition Arousal/Alertness: Awake/alert Behavior During Therapy: WFL for tasks assessed/performed Overall Cognitive Status: No family/caregiver present to determine baseline cognitive functioning Area of Impairment: Problem solving                             Problem Solving: Slow processing;Difficulty sequencing;Requires verbal cues General Comments: increased time to process throughout session; required repeated instructional cues      Exercises Other Exercises Other Exercises: pursed lip breathing required moderate cues to correct technique and continue to utilize to raise O2 sats Other Exercises: IS x 3 reps to assist with O2 sats (cues to slow rate; max 1036ml; 3 breaths with flutter valve    General Comments General comments (skin integrity, edema, etc.): Incr time prior to ambulation working on pt's bloody nose (RN made aware)      Pertinent Vitals/Pain Pain Assessment: No/denies pain    Home Living  Prior Function            PT Goals (current goals can now be found in the care plan section) Acute Rehab PT Goals Patient Stated Goal: to be able to bathe himself PT Goal Formulation: With patient Time For Goal Achievement: 09/18/20 Potential to Achieve Goals: Good Progress towards PT goals: Progressing toward goals    Frequency    Min 3X/week      PT Plan Current plan remains appropriate    Co-evaluation              AM-PAC PT "6  Clicks" Mobility   Outcome Measure  Help needed turning from your back to your side while in a flat bed without using bedrails?: None Help needed moving from lying on your back to sitting on the side of a flat bed without using bedrails?: None Help needed moving to and from a bed to a chair (including a wheelchair)?: A Little Help needed standing up from a chair using your arms (e.g., wheelchair or bedside chair)?: A Little Help needed to walk in hospital room?: A Little Help needed climbing 3-5 steps with a railing? : A Lot 6 Click Score: 19    End of Session Equipment Utilized During Treatment: Oxygen Activity Tolerance: Patient limited by fatigue Patient left: in chair;with call bell/phone within reach;with nursing/sitter in room Nurse Communication: Other (comment) (rt nares bleeding; needs saline spray) PT Visit Diagnosis: Unsteadiness on feet (R26.81);Other abnormalities of gait and mobility (R26.89);Muscle weakness (generalized) (M62.81)     Time: [id number] PT Time Calculation (min) (ACUTE ONLY): 36 min  Charges:  $Gait Training: 8-22 mins $Therapeutic Exercise: 8-22 mins                      Arby Barrette, PT Pager [phone]    Rexanne Mano 09/10/2020, 7:44 PM

## 2020-09-10 NOTE — TOC Transition Note (Signed)
Transition of Care [facility]) - CM/SW Discharge Note   Patient Details  Name: Robert [name] MRN: [id number] Date of Birth: 11-22-1955  [facility] CM/SW Contact:  Verdell Carmine, RN Phone Number: 09/10/2020, 8:56 AM   Clinical Narrative:    Gilford Rile being provided by [employer], adapt is negated foe walker  Patient set up with [facility] for [facility], and [employer] for o2 and  DME. Awaiting HH orders. No other needs identified.    Final next level of care: [place] Barriers to Discharge: Continued Medical Work up   Patient Goals and CMS Choice Patient states their goals for this hospitalization and ongoing recovery are:: to return home CMS Medicare.gov Compare Post Acute Care list provided to:: Patient Choice offered to / list presented to : Patient,Spouse (Received verbal permission to contact wife.)  Discharge Placement                       Discharge Plan and Services In-house Referral: NA Discharge Planning Services: CM Consult Post Acute Care Choice: [place]          DME Arranged: Gilford Rile rolling DME Agency: [employer] Date DME Agency Contacted: 09/10/20 Time DME Agency Contacted: [phone] Representative spoke with at DME Agency: Sheila [place]: PT [place]: [place] Date [place]: 09/09/20 Time [place]: [id number] Representative spoke with at [place]: [place] Determinants of Health ([place]) Interventions     Readmission Risk Interventions No flowsheet data found.

## 2020-09-11 ENCOUNTER — Inpatient Hospital Stay (HOSPITAL_COMMUNITY): Payer: 59

## 2020-09-11 LAB — D-DIMER, QUANTITATIVE: D-Dimer, Quant: 1.43 ug/mL-FEU — ABNORMAL HIGH (ref 0.00–0.50)

## 2020-09-11 LAB — GLUCOSE, CAPILLARY
Glucose-Capillary: 107 mg/dL — ABNORMAL HIGH (ref 70–99)
Glucose-Capillary: 191 mg/dL — ABNORMAL HIGH (ref 70–99)
Glucose-Capillary: 281 mg/dL — ABNORMAL HIGH (ref 70–99)
Glucose-Capillary: 294 mg/dL — ABNORMAL HIGH (ref 70–99)

## 2020-09-11 MED ORDER — FUROSEMIDE 10 MG/ML IJ SOLN
60.0000 mg | Freq: Once | INTRAMUSCULAR | Status: AC
  Administered 2020-09-11: 60 mg via INTRAVENOUS
  Filled 2020-09-11: qty 6

## 2020-09-11 MED ORDER — ENOXAPARIN SODIUM 100 MG/ML ~~LOC~~ SOLN
85.0000 mg | Freq: Two times a day (BID) | SUBCUTANEOUS | Status: DC
  Administered 2020-09-11 – 2020-09-13 (×4): 85 mg via SUBCUTANEOUS
  Filled 2020-09-11 (×2): qty 1
  Filled 2020-09-11: qty 0.85
  Filled 2020-09-11: qty 1
  Filled 2020-09-11: qty 0.85
  Filled 2020-09-11: qty 1

## 2020-09-11 NOTE — Plan of Care (Signed)
  Problem: Respiratory: Goal: Will maintain a patent airway Outcome: Progressing Goal: Complications related to the disease process, condition or treatment will be avoided or minimized Outcome: Progressing   Problem: Education: Goal: Knowledge of General Education information will improve Description: Including pain rating scale, medication(s)/side effects and non-pharmacologic comfort measures Outcome: Progressing   Problem: Clinical Measurements: Goal: Respiratory complications will improve Outcome: Progressing Goal: Cardiovascular complication will be avoided Outcome: Progressing   Problem: Activity: Goal: Risk for activity intolerance will decrease Outcome: Progressing   Problem: Nutrition: Goal: Adequate nutrition will be maintained Outcome: Progressing   Problem: Coping: Goal: Level of anxiety will decrease Outcome: Progressing

## 2020-09-11 NOTE — Progress Notes (Signed)
PROGRESS NOTE                                                                                                                                                                                                             Patient Demographics:    Robert [name], is a 64 y.o. male, DOB - 01-30-1956, WSF:[id number]  Outpatient Primary MD for the patient is Doree Albee, MD   Admit date - 08/10/2020   LOS - 12  Chief Complaint  Patient presents with  . hxpoxia       Brief Narrative: Patient is a 64 y.o. male with PMHx of CKD stage IIIb, DM-2, HTN, morbid obesity, BPH-who was admitted by PCCM on 11/28 due to severe hypoxic respiratory failure due to COVID-19 pneumonia.   Patient apparently had attended a funeral-where he apparently may have been exposed to COVID-19 infection.  COVID-19 vaccinated status: Vaccinated (second Pfizer vaccine dose> 6 months back-no booster yet)  Significant Events: 11/28>> Admit to [facility] for severe hypoxia due to COVID-19 pneumonia  Significant studies: 11/28>>Chest x-ray: Bilateral airspace opacities 11/29>> Echo: EF 01-74%, grade 1 diastolic dysfunction. 11/30>> chest x-ray: Diffuse bilateral pulmonary infiltrates. 12/1>> chest x-ray: Progressive multifocal pulmonary infiltrates.  COVID-19 medications: Steroids: 11/28>> Remdesivir: 11/28>> 12/2 Baricitinib: 11/28>>  Antibiotics: Rocephin: 11/28 x 1 Zithromax: 11/28 x 1  Microbiology data: 11/28 >>blood culture: No growth  Procedures: None  Consults: None  DVT prophylaxis: Prophylactic Lovenox    Subjective:   Patient in bed, appears comfortable, denies any headache, no fever, no chest pain or pressure, no shortness of breath at rest but still quite hypoxic on exertion, no abdominal pain. No focal weakness.   Assessment  & Plan :   Acute Hypoxic Resp Failure due to Covid 19 Viral pneumonia: he has moderate to severe  parenchymal lung injury with breakthrough infection, he received 2 doses of Pfizer vaccine and the second shot was 6 months ago.  Had not received his third shot.  Is been treated with combination of Steroids, Remdesivir and Baricitinib.  Is now showing signs of recovery.  Monitor closely.  Encouraged to sit up in chair use I-S and flutter valve for pulmonary toiletry. At rest he looks quite stable on 3 to 4 L nasal cannula oxygen but still extremely hypoxic on minimal exertion requiring over 15 L of oxygen. Will  give an empiric trial of Lasix IV on 09/11/2020 and continue to monitor  O2 requirements:  SpO2: 98 % O2 Flow Rate (L/min): 3 L/min FiO2 (%): 100 %   Prone/Incentive Spirometry: encouraged patient to lie prone for 3-4 hours at a time for a total of 16 hours a day, and to encourage incentive spirometry use 3-4/hour.   Recent Labs  Lab 09/05/20 0243 09/06/20 0504 09/07/20 1111 09/07/20 1112 09/08/20 0122 09/09/20 0223 09/11/20 0205  WBC 12.9* 15.4* 18.3*  --  17.5* 15.3*  --   HGB 14.2 14.6 14.4  --  13.6 13.3  --   HCT 41.6 44.4 43.7  --  41.4 41.0  --   PLT 295 336 465*  --  478* 431*  --   CRP 9.8* 11.4* 11.3*  --  9.7* 8.8*  --   BNP 45.6 35.6  --  43.2 26.0 32.1  --   DDIMER 3.66* 4.72* 3.56*  --  2.33* 2.47* 1.43*  AST 25 20 25   --  25 22  --   ALT 24 23 24   --  26 27  --   ALKPHOS 53 57 60  --  61 59  --   BILITOT 0.6 0.9 0.8  --  0.4 0.6  --   ALBUMIN 2.4* 2.3* 2.4*  --  2.3* 2.2*  --      Elevated D-dimer: Secondary to COVID-19 related infection- -ve Doppler.  Switched him to therapeutic Lovenox on 09/05/2020 will cut down to high prophylactic dose on 09/11/2020 as D-dimer is below 2 now, 2 Weeks of prophylactic Eliquis upon discharge.  AKI on CKD stage IIIb: AKI likely hemodynamically mediated-improving with supportive care.  Follow electrolytes closely. Baseline creatinine seems to be close to 1.7.  Mild incidental ascending aortic dilation on echocardiogram.   Follow with PCP and cardiology in 2 to 3 weeks post discharge.    HTN: BP controlled-continue amlodipine.  HLD: Continue statin  BPH: Continue Flomax  Presumed OSA: On HFNC - we will need outpatient sleep study.  Morbid obesity - BMI 50, follow with PCP.  DM-2 (A1c 7.9 on 11/28) with uncontrolled hyperglycemia due to steroids: CBGs improving-Insulin adjusted 09/10/20 due to steroid taper..  Recent Labs    09/05/20 1650 09/05/20 2038 09/06/20 0822  GLUCAP 228* 227* 166*      GI prophylaxis: PPI  Condition - Fair  Family Communication  :  Previous MD  - Spouse Briscoe Burns [phone])  on 09/03/20, I spoke -  09/06/20, 09/10/20   Code Status :  Full Code  Diet :  Diet Order            Diet Carb Modified Fluid consistency: Thin; Room service appropriate? Yes  Diet effective now                  Disposition Plan  :  Status is: Inpatient  Remains inpatient appropriate because:Inpatient level of care appropriate due to severity of illness  Dispo: The patient is from: Home              Anticipated d/c is to: Home              Anticipated d/c date is: > 3 days              Patient currently is not medically stable to d/c.   Barriers to discharge: Hypoxia requiring O2 supplementation/complete 5 days of IV Remdesivir  Antimicorbials  :    Anti-infectives (From admission,  onward)   Start     Dose/Rate Route Frequency Ordered Stop   08/31/20 1000  remdesivir 100 mg in sodium chloride 0.9 % 100 mL IVPB  Status:  Discontinued       "Followed by" Linked Group Details   100 mg 200 mL/hr over 30 Minutes Intravenous Daily 08/09/2020 2023 08/24/2020 2113   08/31/20 1000  remdesivir 100 mg in sodium chloride 0.9 % 100 mL IVPB        100 mg 200 mL/hr over 30 Minutes Intravenous Daily 08/08/2020 1428 09/03/20 1057   08/06/2020 2022  remdesivir 200 mg in sodium chloride 0.9% 250 mL IVPB  Status:  Discontinued       "Followed by" Linked Group Details   200 mg 580 mL/hr over 30  Minutes Intravenous Once 08/10/2020 2023 08/19/2020 2113   08/29/2020 1430  remdesivir 100 mg in sodium chloride 0.9 % 100 mL IVPB        100 mg 200 mL/hr over 30 Minutes Intravenous Every 30 min 08/24/2020 1428 08/18/2020 1639   08/29/2020 1145  cefTRIAXone (ROCEPHIN) 2 g in sodium chloride 0.9 % 100 mL IVPB        2 g 200 mL/hr over 30 Minutes Intravenous  Once 08/05/2020 1136 08/14/2020 1409   08/06/2020 1145  azithromycin (ZITHROMAX) 500 mg in sodium chloride 0.9 % 250 mL IVPB  Status:  Discontinued        500 mg 250 mL/hr over 60 Minutes Intravenous Every 24 hours 08/17/2020 1136 08/31/20 0853      Inpatient Medications  Scheduled Meds: . amLODipine  10 mg Oral Daily  . vitamin C  500 mg Oral Daily  . baricitinib  2 mg Oral Daily  . Chlorhexidine Gluconate Cloth  6 each Topical Daily  . docusate sodium  100 mg Oral BID  . enoxaparin (LOVENOX) injection  1 mg/kg Subcutaneous Q12H  . famotidine  20 mg Oral BID  . furosemide  60 mg Intravenous Once  . influenza vac split quadrivalent PF  0.5 mL Intramuscular Tomorrow-1000  . insulin aspart  0-20 Units Subcutaneous TID WC  . insulin aspart  0-5 Units Subcutaneous QHS  . insulin aspart  6 Units Subcutaneous TID WC  . insulin detemir  25 Units Subcutaneous BID  . pantoprazole  40 mg Oral Q1200  . predniSONE  20 mg Oral Daily  . rosuvastatin  40 mg Oral QHS  . tamsulosin  0.4 mg Oral Daily  . zinc sulfate  220 mg Oral Daily   Continuous Infusions:  PRN Meds:.guaiFENesin-dextromethorphan, lip balm, [DISCONTINUED] ondansetron **OR** ondansetron (ZOFRAN) IV, polyethylene glycol, sodium chloride   Time Spent in minutes  35   See all Orders from today for further details   Lala Lund M.D on 09/11/2020 at 8:48 AM  To page go to [email or web address] - use universal password  [facility] -  Office  [phone]    Objective:   Vitals:   09/10/20 1352 09/10/20 1800 09/10/20 2112 09/11/20 0441  BP: (!) 144/92  118/63 (!) 142/81  Pulse: 93   85 85  Resp: 20  16 19   Temp: 70.1 F (36.8 C)  97.6 F (36.4 C) 97.8 F (36.6 C)  TempSrc: Oral  Oral Axillary  SpO2: 98% 92% 92% 98%  Weight:    (!) 169.2 kg  Height:        Wt Readings from Last 3 Encounters:  09/11/20 (!) 169.2 kg  07/16/20 (!) 184 kg  04/28/20 (!) 180.1 kg  Intake/Output Summary (Last 24 hours) at 09/11/2020 0848 Last data filed at 09/11/2020 0443 Gross per 24 hour  Intake 1080 ml  Output 1375 ml  Net -295 ml     Physical Exam  Awake Alert, No new F.N deficits, Normal affect [place].AT,PERRAL Supple Neck,No JVD, No cervical lymphadenopathy appriciated.  Symmetrical Chest wall movement, Good air movement bilaterally, few rales RRR,No Gallops, Rubs or new Murmurs, No Parasternal Heave +ve B.Sounds, Abd Soft, No tenderness, No organomegaly appriciated, No rebound - guarding or rigidity. No Cyanosis, Clubbing or edema, No new Rash or bruise    Data Review:    CBC Recent Labs  Lab 09/05/20 0243 09/06/20 0504 09/07/20 1111 09/08/20 0122 09/09/20 0223  WBC 12.9* 15.4* 18.3* 17.5* 15.3*  HGB 14.2 14.6 14.4 13.6 13.3  HCT 41.6 44.4 43.7 41.4 41.0  PLT 295 336 465* 478* 431*  MCV 84.7 84.7 86.0 85.0 85.8  MCH 28.9 27.9 28.3 27.9 27.8  MCHC 34.1 32.9 33.0 32.9 32.4  RDW 12.6 12.5 12.8 12.6 12.5  LYMPHSABS  --  1.3 0.9 2.0 1.4  MONOABS  --  0.5 0.8 1.1* 1.1*  EOSABS  --  0.2 0.1 0.1 0.1  BASOSABS  --  0.0 0.0 0.0 0.0    Chemistries  Recent Labs  Lab 09/05/20 0243 09/06/20 0504 09/07/20 1111 09/08/20 0122 09/09/20 0223  NA 136 136 131* 136 134*  K 4.2 4.1 4.3 4.2 4.6  CL 100 99 98 100 99  CO2 28 27 23 26 26   GLUCOSE 123* 121* 200* 64* 103*  BUN 29* 30* 28* 31* 29*  CREATININE 1.76* 1.65* 1.60* 1.74* 1.65*  CALCIUM 8.4* 8.5* 8.3* 8.7* 8.6*  MG  --  2.4 2.5* 2.6* 2.5*  AST 25 20 25 25 22   ALT 24 23 24 26 27   ALKPHOS 53 57 60 61 59  BILITOT 0.6 0.9 0.8 0.4 0.6    ------------------------------------------------------------------------------------------------------------------ No results for input(s): CHOL, HDL, LDLCALC, TRIG, CHOLHDL, LDLDIRECT in the last 72 hours.  Lab Results  Component Value Date   HGBA1C 7.9 (H) 08/28/2020   ------------------------------------------------------------------------------------------------------------------ No results for input(s): TSH, T4TOTAL, T3FREE, THYROIDAB in the last 72 hours.  Invalid input(s): FREET3 ------------------------------------------------------------------------------------------------------------------ No results for input(s): VITAMINB12, FOLATE, FERRITIN, TIBC, IRON, RETICCTPCT in the last 72 hours.  Coagulation profile No results for input(s): INR, PROTIME in the last 168 hours.  Recent Labs    09/09/20 0223 09/11/20 0205  DDIMER 2.47* 1.43*    Cardiac Enzymes No results for input(s): CKMB, TROPONINI, MYOGLOBIN in the last 168 hours.  Invalid input(s): CK ------------------------------------------------------------------------------------------------------------------    Component Value Date/Time   BNP 32.1 09/09/2020 0223    Micro Results No results found for this or any previous visit (from the past 240 hour(s)).  Radiology Reports DG Chest Port 1 View  Result Date: 09/09/2020 CLINICAL DATA:  Shortness of breath, COVID positive EXAM: PORTABLE CHEST 1 VIEW COMPARISON:  09/07/2020 FINDINGS: Low lung volumes. Persistent bilateral opacities with similar lung aeration no significant pleural effusion. No pneumothorax. Stable cardiomediastinal contours. IMPRESSION: Persistent extensive bilateral opacities with similar lung aeration. Electronically Signed   By: Macy Mis M.D.   On: 09/09/2020 08:06   DG Chest Port 1 View  Result Date: 09/07/2020 CLINICAL DATA:  COVID, hypoxia EXAM: PORTABLE CHEST 1 VIEW COMPARISON:  09/05/2020 FINDINGS: Extensive airspace disease  bilaterally, worsening since prior study. Low lung volumes. No effusions or pneumothorax. IMPRESSION: Worsening bilateral airspace disease. Electronically Signed   By: Rolm Baptise M.D.  On: 09/07/2020 08:41   DG Chest Port 1 View  Result Date: 09/05/2020 CLINICAL DATA:  Shortness of breath and COVID-19 positivity EXAM: PORTABLE CHEST 1 VIEW COMPARISON:  09/02/2020 FINDINGS: Cardiac shadow is stable. Diffuse bilateral airspace opacities are again identified slightly improved when compared with the prior exam. No new focal abnormality is noted. No bony abnormality is seen. IMPRESSION: Slight improvement in bilateral airspace disease as described. Electronically Signed   By: Inez Catalina M.D.   On: 09/05/2020 09:11   DG Chest Port 1 View  Result Date: 09/02/2020 CLINICAL DATA:  Respiratory failure EXAM: PORTABLE CHEST 1 VIEW COMPARISON:  09/01/2020 FINDINGS: The lungs are symmetrically expanded. There is interval progression of asymmetric mid and lower lung zone pulmonary infiltrate, likely infectious or inflammatory in nature. No pneumothorax or pleural effusion. Cardiac size within normal limits. The pulmonary vascularity is normal. IMPRESSION: Progressive multifocal pulmonary infiltrate, likely infectious or inflammatory. Electronically Signed   By: Fidela Salisbury MD   On: 09/02/2020 06:28   DG Chest Port 1 View  Result Date: 08/06/2020 CLINICAL DATA:  Shortness of breath. EXAM: PORTABLE CHEST 1 VIEW COMPARISON:  None. FINDINGS: Cardiomediastinal silhouette is accentuated by low lung volumes and AP portable technique. Low lung volumes with bilateral peripheral predominant airspace opacities. No visible pleural effusions or pneumothorax. No acute osseous abnormality. IMPRESSION: Low lung volumes with bilateral peripheral predominant airspace opacities, concerning for multifocal pneumonia. Recommend correlation with COVID testing. Electronically Signed   By: Margaretha Sheffield MD   On: 08/10/2020 11:59    DG Chest Port 1V same Day  Result Date: 09/01/2020 CLINICAL DATA:  Respiratory failure. Shortness of breath. History of COVID. EXAM: PORTABLE CHEST 1 VIEW COMPARISON:  08/21/2020. FINDINGS: Cardiomegaly. Diffuse bilateral pulmonary infiltrates/edema again noted. Low lung volumes. No pleural effusion or pneumothorax. No acute bony abnormality. IMPRESSION: Cardiomegaly. Diffuse bilateral pulmonary infiltrates/edema again noted. Congestive heart failure and or bilateral pneumonia could present in this fashion. Low lung volumes. Electronically Signed   By: Marcello Moores  Register   On: 09/01/2020 09:29   VAS [place] LOWER EXTREMITY VENOUS (DVT)  Result Date: 09/05/2020  Lower Venous DVT Study Indications: Elevated d-dimer, Covid.  Comparison Study: No prior studies. Performing Technologist: Darlin Coco, RDMS  Examination Guidelines: A complete evaluation includes B-mode imaging, spectral Doppler, color Doppler, and power Doppler as needed of all accessible portions of each vessel. Bilateral testing is considered an integral part of a complete examination. Limited examinations for reoccurring indications may be performed as noted. The reflux portion of the exam is performed with the patient in reverse Trendelenburg.  +---------+---------------+---------+-----------+----------+--------------+ RIGHT    CompressibilityPhasicitySpontaneityPropertiesThrombus Aging +---------+---------------+---------+-----------+----------+--------------+ CFV      Full           Yes      Yes                                 +---------+---------------+---------+-----------+----------+--------------+ SFJ      Full                                                        +---------+---------------+---------+-----------+----------+--------------+ FV Prox  Full                                                        +---------+---------------+---------+-----------+----------+--------------+  FV Mid   Full                                                         +---------+---------------+---------+-----------+----------+--------------+ FV DistalFull                                                        +---------+---------------+---------+-----------+----------+--------------+ PFV      Full                                                        +---------+---------------+---------+-----------+----------+--------------+ POP      Full           Yes      Yes                                 +---------+---------------+---------+-----------+----------+--------------+ PTV      Full                                                        +---------+---------------+---------+-----------+----------+--------------+ PERO     Full                                                        +---------+---------------+---------+-----------+----------+--------------+   +---------+---------------+---------+-----------+----------+--------------+ LEFT     CompressibilityPhasicitySpontaneityPropertiesThrombus Aging +---------+---------------+---------+-----------+----------+--------------+ CFV      Full           Yes      Yes                                 +---------+---------------+---------+-----------+----------+--------------+ SFJ      Full                                                        +---------+---------------+---------+-----------+----------+--------------+ FV Prox  Full                                                        +---------+---------------+---------+-----------+----------+--------------+ FV Mid   Full                                                        +---------+---------------+---------+-----------+----------+--------------+  FV DistalFull                                                        +---------+---------------+---------+-----------+----------+--------------+ PFV      Full                                                         +---------+---------------+---------+-----------+----------+--------------+ POP      Full           Yes      Yes                                 +---------+---------------+---------+-----------+----------+--------------+ PTV      Full                                                        +---------+---------------+---------+-----------+----------+--------------+ PERO     Full                                                        +---------+---------------+---------+-----------+----------+--------------+     Summary: RIGHT: - There is no evidence of deep vein thrombosis in the lower extremity.  - No cystic structure found in the popliteal fossa.  LEFT: - There is no evidence of deep vein thrombosis in the lower extremity.  - No cystic structure found in the popliteal fossa.  *See table(s) above for measurements and observations. Electronically signed by Harold Barban MD on 09/05/2020 at 5:31:25 PM.    Final    ECHOCARDIOGRAM LIMITED  Result Date: 08/31/2020    ECHOCARDIOGRAM LIMITED REPORT   Patient Name:   Robert [name] Date of Exam: 08/31/2020 Medical Rec #:  [id number]       Height:       73.0 in Accession #:    [id number]      Weight:       364.2 lb Date of Birth:  1956/07/12       BSA:          2.776 m Patient Age:    21 years        BP:           142/86 mmHg Patient Gender: M               HR:           101 bpm. Exam Location:  Inpatient Procedure: Limited Echo, Cardiac Doppler, Color Doppler and Intracardiac            Opacification Agent Indications:    Chest Pain 786.50/ R07.9  History:        Patient has no prior history of Echocardiogram examinations.                 Risk Factors:Hypertension, Diabetes and  Non-Smoker. GERD.  Sonographer:    Vickie Epley RDCS Referring Phys: [id number] [facility] CHAND  Sonographer Comments: Covid positive. IMPRESSIONS  1. Left ventricular ejection fraction, by estimation, is 65 to 70%. The left ventricle has normal function. The left ventricle has no  regional wall motion abnormalities. There is mild left ventricular hypertrophy. Left ventricular diastolic parameters are consistent with Grade I diastolic dysfunction (impaired relaxation).  2. Right ventricule is not well visualized but grossly normal size and systolic function. Tricuspid regurgitation signal is inadequate for assessing PA pressure.  3. The mitral valve is normal in structure. No evidence of mitral valve regurgitation.  4. The aortic valve is tricuspid. Aortic valve regurgitation is not visualized. No aortic stenosis is present.  5. Aortic dilatation noted. There is mild dilatation of the ascending aorta, measuring 39 mm. FINDINGS  Left Ventricle: Left ventricular ejection fraction, by estimation, is 65 to 70%. The left ventricle has normal function. The left ventricle has no regional wall motion abnormalities. Definity contrast agent was given IV to delineate the left ventricular  endocardial borders. The left ventricular internal cavity size was normal in size. There is mild left ventricular hypertrophy. Left ventricular diastolic parameters are consistent with Grade I diastolic dysfunction (impaired relaxation). Right Ventricle: The right ventricular size is not well visualized. Right ventricular systolic function was not well visualized. Tricuspid regurgitation signal is inadequate for assessing PA pressure. Left Atrium: Left atrial size was normal in size. Pericardium: There is no evidence of pericardial effusion. Mitral Valve: The mitral valve is normal in structure. Tricuspid Valve: The tricuspid valve is normal in structure. Tricuspid valve regurgitation is not demonstrated. Aortic Valve: The aortic valve is tricuspid. Aortic valve regurgitation is not visualized. No aortic stenosis is present. Pulmonic Valve: The pulmonic valve was not well visualized. Pulmonic valve regurgitation is not visualized. Aorta: The aortic root is normal in size and structure and aortic dilatation noted. There  is mild dilatation of the ascending aorta, measuring 39 mm. IAS/Shunts: The interatrial septum was not well visualized. LEFT VENTRICLE PLAX 2D LVIDd:         4.60 cm      Diastology LVIDs:         3.40 cm      LV e' medial:    6.42 cm/s LV PW:         1.10 cm      LV E/e' medial:  11.2 LV IVS:        1.10 cm      LV e' lateral:   9.25 cm/s LVOT diam:     2.60 cm      LV E/e' lateral: 7.7 LV SV:         106 LV SV Index:   38 LVOT Area:     5.31 cm  LV Volumes (MOD) LV vol d, MOD A2C: 91.9 ml LV vol d, MOD A4C: 116.0 ml LV vol s, MOD A2C: 21.5 ml LV vol s, MOD A4C: 37.8 ml LV SV MOD A2C:     70.4 ml LV SV MOD A4C:     116.0 ml LV SV MOD BP:      72.2 ml LEFT ATRIUM           Index LA diam:      4.10 cm 1.48 cm/m LA Vol (A4C): 46.1 ml 16.61 ml/m  AORTIC VALVE LVOT Vmax:   108.00 cm/s LVOT Vmean:  70.200 cm/s LVOT VTI:    0.199 m  AORTA Ao Root diam:  3.80 cm Ao Asc diam:  3.90 cm MITRAL VALVE MV Area (PHT): 5.62 cm    SHUNTS MV Decel Time: 135 msec    Systemic VTI:  0.20 m MV E velocity: 71.60 cm/s  Systemic Diam: 2.60 cm MV A velocity: 90.00 cm/s MV E/A ratio:  0.80 Oswaldo Milian MD Electronically signed by Oswaldo Milian MD Signature Date/Time: 08/31/2020/12:03:23 PM    Final

## 2020-09-11 NOTE — Progress Notes (Signed)
Physical Therapy Treatment Patient Details Name: Robert [name] MRN: [id number] DOB: 1956/07/19 Today's Date: 09/11/2020    History of Present Illness Pt is a 64 y/o male with a PMH significant for CKD stage IIIb, DM, HTN, morbid obesity, BPH. He was admitted 08/09/2020 with severe hypoxic respiratory failure 2 COVID-19 PNA. Vaccinated    PT Comments    Pt mobility limited by cardiopulmonary status. Desat to 60% on 6L during transfer bed to recliner. 15L HFNC + 15L NRB in recliner to recover to 93%. Pt then performed sit to stand supervision and ascend/descend single step x 2 trials min guard assist. Desat to 79% with 15+15 O2 during activity. 3-4 minute recovery to return to 90%. Max RR 40. Pt performed LE exercises seated. Pt remained on 15+15 O2 at end of session, SpO2 93%.   Follow Up Recommendations  Home health PT;Supervision/Assistance - 24 hour     Equipment Recommendations  Rolling walker with 5" wheels    Recommendations for Other Services       Precautions / Restrictions Precautions Precautions: Other (comment) Precaution Comments: watch O2, tachycardia Restrictions Weight Bearing Restrictions: No    Mobility  Bed Mobility               General bed mobility comments: up in chair, returned to chair  Transfers Overall transfer level: Needs assistance Equipment used: None Transfers: Sit to/from Stand Sit to Stand: Supervision Stand pivot transfers: Supervision       General transfer comment: supervision for safety, cues for technique and line management  Ambulation/Gait                 Stairs Stairs: Yes Stairs assistance: Min guard Stair Management: No rails;Step to pattern;Forwards Number of Stairs: 1 (x 2 trials) General stair comments: Stair training using single step in room. Pt grasping window sill for support.   Wheelchair Mobility    Modified Rankin (Stroke Patients Only)       Balance Overall balance assessment: Needs  assistance Sitting-balance support: Feet supported;No upper extremity supported Sitting balance-Leahy Scale: Good     Standing balance support: Single extremity supported;No upper extremity supported;During functional activity Standing balance-Leahy Scale: Fair                              Cognition Arousal/Alertness: Awake/alert Behavior During Therapy: WFL for tasks assessed/performed Overall Cognitive Status: No family/caregiver present to determine baseline cognitive functioning Area of Impairment: Problem solving                             Problem Solving: Slow processing;Difficulty sequencing;Requires verbal cues General Comments: increased time to process. Slow to respond.      Exercises General Exercises - Lower Extremity Ankle Circles/Pumps: AROM;Both;10 reps;Seated Long Arc Quad: AROM;Right;Left;10 reps;Seated    General Comments General comments (skin integrity, edema, etc.): Desat to 60% during transfer bed to recliner on 6L. Required 15L HFNC + 15L NRB to recover to 93% after 10-15 minuted seated rest break. Pt then desat to 79% during standing and step training.      Pertinent Vitals/Pain Pain Assessment: No/denies pain    Home Living                      Prior Function            PT Goals (current goals can now be found in  the care plan section) Acute Rehab PT Goals Patient Stated Goal: home Progress towards PT goals: Progressing toward goals    Frequency    Min 3X/week      PT Plan Current plan remains appropriate    Co-evaluation              AM-PAC PT "6 Clicks" Mobility   Outcome Measure  Help needed turning from your back to your side while in a flat bed without using bedrails?: None Help needed moving from lying on your back to sitting on the side of a flat bed without using bedrails?: None Help needed moving to and from a bed to a chair (including a wheelchair)?: A Little Help needed standing up  from a chair using your arms (e.g., wheelchair or bedside chair)?: A Little Help needed to walk in hospital room?: A Little Help needed climbing 3-5 steps with a railing? : A Little 6 Click Score: 20    End of Session Equipment Utilized During Treatment: Oxygen Activity Tolerance: Treatment limited secondary to medical complications (Comment) (O2 needs/desat) Patient left: in chair;with call bell/phone within reach Nurse Communication: Mobility status PT Visit Diagnosis: Unsteadiness on feet (R26.81);Other abnormalities of gait and mobility (R26.89);Muscle weakness (generalized) (M62.81)     Time: 3235-5732 PT Time Calculation (min) (ACUTE ONLY): 14 min  Charges:  $Gait Training: 8-22 mins                     Lorrin Goodell, PT  Office # [phone] Pager [phone]    Robert [name] 09/11/2020, 11:16 AM

## 2020-09-12 LAB — CBC WITH DIFFERENTIAL/PLATELET
Abs Immature Granulocytes: 0.17 10*3/uL — ABNORMAL HIGH (ref 0.00–0.07)
Basophils Absolute: 0 10*3/uL (ref 0.0–0.1)
Basophils Relative: 0 %
Eosinophils Absolute: 0.1 10*3/uL (ref 0.0–0.5)
Eosinophils Relative: 1 %
HCT: 40.9 % (ref 39.0–52.0)
Hemoglobin: 13.9 g/dL (ref 13.0–17.0)
Immature Granulocytes: 1 %
Lymphocytes Relative: 24 %
Lymphs Abs: 3.4 10*3/uL (ref 0.7–4.0)
MCH: 28.8 pg (ref 26.0–34.0)
MCHC: 34 g/dL (ref 30.0–36.0)
MCV: 84.9 fL (ref 80.0–100.0)
Monocytes Absolute: 1.4 10*3/uL — ABNORMAL HIGH (ref 0.1–1.0)
Monocytes Relative: 10 %
Neutro Abs: 9.3 10*3/uL — ABNORMAL HIGH (ref 1.7–7.7)
Neutrophils Relative %: 64 %
Platelets: 321 10*3/uL (ref 150–400)
RBC: 4.82 MIL/uL (ref 4.22–5.81)
RDW: 12.7 % (ref 11.5–15.5)
WBC: 14.4 10*3/uL — ABNORMAL HIGH (ref 4.0–10.5)
nRBC: 0 % (ref 0.0–0.2)

## 2020-09-12 LAB — COMPREHENSIVE METABOLIC PANEL
ALT: 23 U/L (ref 0–44)
AST: 24 U/L (ref 15–41)
Albumin: 2.5 g/dL — ABNORMAL LOW (ref 3.5–5.0)
Alkaline Phosphatase: 54 U/L (ref 38–126)
Anion gap: 10 (ref 5–15)
BUN: 28 mg/dL — ABNORMAL HIGH (ref 8–23)
CO2: 26 mmol/L (ref 22–32)
Calcium: 8.8 mg/dL — ABNORMAL LOW (ref 8.9–10.3)
Chloride: 99 mmol/L (ref 98–111)
Creatinine, Ser: 1.65 mg/dL — ABNORMAL HIGH (ref 0.61–1.24)
GFR, Estimated: 47 mL/min — ABNORMAL LOW (ref >60)
Glucose, Bld: 136 mg/dL — ABNORMAL HIGH (ref 70–99)
Potassium: 5.1 mmol/L (ref 3.5–5.1)
Sodium: 135 mmol/L (ref 135–145)
Total Bilirubin: UNDETERMINED mg/dL (ref 0.3–1.2)
Total Protein: 6.3 g/dL — ABNORMAL LOW (ref 6.5–8.1)

## 2020-09-12 LAB — BRAIN NATRIURETIC PEPTIDE: B Natriuretic Peptide: 57.3 pg/mL (ref 0.0–100.0)

## 2020-09-12 LAB — D-DIMER, QUANTITATIVE: D-Dimer, Quant: 1.09 ug/mL-FEU — ABNORMAL HIGH (ref 0.00–0.50)

## 2020-09-12 LAB — GLUCOSE, CAPILLARY
Glucose-Capillary: 128 mg/dL — ABNORMAL HIGH (ref 70–99)
Glucose-Capillary: 217 mg/dL — ABNORMAL HIGH (ref 70–99)
Glucose-Capillary: 243 mg/dL — ABNORMAL HIGH (ref 70–99)
Glucose-Capillary: 199 mg/dL — ABNORMAL HIGH (ref 70–99)

## 2020-09-12 LAB — C-REACTIVE PROTEIN: CRP: 0.6 mg/dL (ref <1.0)

## 2020-09-12 LAB — MAGNESIUM: Magnesium: UNDETERMINED mg/dL (ref 1.7–2.4)

## 2020-09-12 MED ORDER — INSULIN DETEMIR 100 UNIT/ML ~~LOC~~ SOLN
30.0000 [IU] | Freq: Two times a day (BID) | SUBCUTANEOUS | Status: DC
  Administered 2020-09-12 – 2020-09-13 (×3): 30 [IU] via SUBCUTANEOUS
  Filled 2020-09-12 (×4): qty 0.3

## 2020-09-12 MED ORDER — PREDNISONE 20 MG PO TABS
40.0000 mg | ORAL_TABLET | Freq: Every day | ORAL | Status: DC
  Administered 2020-09-12 – 2020-09-13 (×2): 40 mg via ORAL
  Filled 2020-09-12 (×2): qty 2

## 2020-09-12 NOTE — Progress Notes (Signed)
PROGRESS NOTE                                                                                                                                                                                                             Patient Demographics:    Robert [name], is a 64 y.o. male, DOB - [birth year], TLX:[id number]  Outpatient Primary MD for the patient is Doree Albee, MD   Admit date - 08/23/2020   LOS - 22  Chief Complaint  Patient presents with  . hxpoxia       Brief Narrative: Patient is a 64 y.o. male with PMHx of CKD stage IIIb, DM-2, HTN, morbid obesity, BPH-who was admitted by PCCM on 11/28 due to severe hypoxic respiratory failure due to COVID-19 pneumonia.   Patient apparently had attended a funeral-where he apparently may have been exposed to COVID-19 infection.  COVID-19 vaccinated status: Vaccinated (second Pfizer vaccine dose> 6 months back-no booster yet)  Significant Events: 11/28>> Admit to [facility] for severe hypoxia due to COVID-19 pneumonia  Significant studies: 11/28>>Chest x-ray: Bilateral airspace opacities 11/29>> Echo: EF 74-16%, grade 1 diastolic dysfunction. 11/30>> chest x-ray: Diffuse bilateral pulmonary infiltrates. 12/1>> chest x-ray: Progressive multifocal pulmonary infiltrates.  COVID-19 medications: Steroids: 11/28>> Remdesivir: 11/28>> 12/2 Baricitinib: 11/28>>  Antibiotics: Rocephin: 11/28 x 1 Zithromax: 11/28 x 1  Microbiology data: 11/28 >>blood culture: No growth  Procedures: None  Consults: None  DVT prophylaxis: Prophylactic Lovenox    Subjective:   Patient in bed, appears comfortable, denies any headache, no fever, no chest pain or pressure, mild shortness of breath , no abdominal pain. No focal weakness.    Assessment  & Plan :   Acute Hypoxic Resp Failure due to Covid 19 Viral pneumonia: he has moderate to severe parenchymal lung injury with breakthrough  infection, he received 2 doses of Pfizer vaccine and the second shot was 6 months ago.  Had not received his third shot.  Is been treated with combination of Steroids, Remdesivir and Baricitinib.  Is now showing signs of recovery.  Monitor closely.  Encouraged to sit up in chair use I-S and flutter valve for pulmonary toiletry. At rest he looks quite stable on 3 to 4 L nasal cannula oxygen but still extremely hypoxic on minimal exertion requiring over 15 L of oxygen.  Continue to monitor closely, as exertional  oxygen demand comes down will prepare for discharge.  So far requiring a lot of oxygen upon minimal exertion which cannot be provided at home.  O2 requirements:  SpO2: 95 % O2 Flow Rate (L/min): 5 L/min FiO2 (%): 100 %   Prone/Incentive Spirometry: encouraged patient to lie prone for 3-4 hours at a time for a total of 16 hours a day, and to encourage incentive spirometry use 3-4/hour.   Recent Labs  Lab 09/06/20 0504 09/07/20 1111 09/07/20 1112 09/08/20 0122 09/09/20 0223 09/11/20 0205 09/12/20 0317  WBC 15.4* 18.3*  --  17.5* 15.3*  --  14.4*  HGB 14.6 14.4  --  13.6 13.3  --  13.9  HCT 44.4 43.7  --  41.4 41.0  --  40.9  PLT 336 465*  --  478* 431*  --  321  CRP 11.4* 11.3*  --  9.7* 8.8*  --  0.6  BNP 35.6  --  43.2 26.0 32.1  --  57.3  DDIMER 4.72* 3.56*  --  2.33* 2.47* 1.43* 1.09*  AST 20 25  --  25 22  --  24  ALT 23 24  --  26 27  --  23  ALKPHOS 57 60  --  61 59  --  54  BILITOT 0.9 0.8  --  0.4 0.6  --  QUANTITY NOT SUFFICIENT, UNABLE TO PERFORM TEST  ALBUMIN 2.3* 2.4*  --  2.3* 2.2*  --  2.5*     Elevated D-dimer: Secondary to COVID-19 related infection- -ve Doppler.  Switched him to therapeutic Lovenox on 09/05/2020 will cut down to high prophylactic dose on 09/11/2020 as D-dimer is below 2 now, 2 Weeks of prophylactic Eliquis upon discharge.  AKI on CKD stage IIIb: AKI likely hemodynamically mediated-improving with supportive care.  Follow electrolytes  closely. Baseline creatinine seems to be close to 1.7.  Mild incidental ascending aortic dilation on echocardiogram.  Follow with PCP and cardiology in 2 to 3 weeks post discharge.    HTN: BP controlled-continue amlodipine.  HLD: Continue statin  BPH: Continue Flomax  Presumed OSA: On HFNC - we will need outpatient sleep study.  Morbid obesity - BMI 50, follow with PCP.  DM-2 (A1c 7.9 on 11/28) with uncontrolled hyperglycemia due to steroids: CBGs improving-Insulin adjusted 09/10/20 due to steroid taper..  Recent Labs    09/05/20 1650 09/05/20 2038 09/06/20 0822  GLUCAP 228* 227* 166*      GI prophylaxis: PPI  Condition - Fair  Family Communication  :  Previous MD  - Spouse Briscoe Burns [phone])  on 09/03/20, I spoke -  09/06/20, 09/10/20   Code Status :  Full Code  Diet :  Diet Order            Diet Carb Modified Fluid consistency: Thin; Room service appropriate? Yes  Diet effective now                  Disposition Plan  :  Status is: Inpatient  Remains inpatient appropriate because:Inpatient level of care appropriate due to severity of illness  Dispo: The patient is from: Home              Anticipated d/c is to: Home              Anticipated d/c date is: > 3 days              Patient currently is not medically stable to d/c.   Barriers to discharge: Hypoxia  requiring O2 supplementation/complete 5 days of IV Remdesivir  Antimicorbials  :    Anti-infectives (From admission, onward)   Start     Dose/Rate Route Frequency Ordered Stop   08/31/20 1000  remdesivir 100 mg in sodium chloride 0.9 % 100 mL IVPB  Status:  Discontinued       "Followed by" Linked Group Details   100 mg 200 mL/hr over 30 Minutes Intravenous Daily 08/06/2020 2023 08/05/2020 2113   08/31/20 1000  remdesivir 100 mg in sodium chloride 0.9 % 100 mL IVPB        100 mg 200 mL/hr over 30 Minutes Intravenous Daily 08/29/2020 1428 09/03/20 1057   08/13/2020 2022  remdesivir 200 mg in sodium  chloride 0.9% 250 mL IVPB  Status:  Discontinued       "Followed by" Linked Group Details   200 mg 580 mL/hr over 30 Minutes Intravenous Once 08/25/2020 2023 08/11/2020 2113   08/25/2020 1430  remdesivir 100 mg in sodium chloride 0.9 % 100 mL IVPB        100 mg 200 mL/hr over 30 Minutes Intravenous Every 30 min 08/07/2020 1428 08/15/2020 1639   08/03/2020 1145  cefTRIAXone (ROCEPHIN) 2 g in sodium chloride 0.9 % 100 mL IVPB        2 g 200 mL/hr over 30 Minutes Intravenous  Once 08/03/2020 1136 08/06/2020 1409   08/26/2020 1145  azithromycin (ZITHROMAX) 500 mg in sodium chloride 0.9 % 250 mL IVPB  Status:  Discontinued        500 mg 250 mL/hr over 60 Minutes Intravenous Every 24 hours 08/26/2020 1136 08/31/20 0853      Inpatient Medications  Scheduled Meds: . amLODipine  10 mg Oral Daily  . vitamin C  500 mg Oral Daily  . baricitinib  2 mg Oral Daily  . Chlorhexidine Gluconate Cloth  6 each Topical Daily  . docusate sodium  100 mg Oral BID  . enoxaparin (LOVENOX) injection  85 mg Subcutaneous Q12H  . famotidine  20 mg Oral BID  . influenza vac split quadrivalent PF  0.5 mL Intramuscular Tomorrow-1000  . insulin aspart  0-20 Units Subcutaneous TID WC  . insulin aspart  0-5 Units Subcutaneous QHS  . insulin aspart  6 Units Subcutaneous TID WC  . insulin detemir  30 Units Subcutaneous BID  . pantoprazole  40 mg Oral Q1200  . predniSONE  40 mg Oral Daily  . rosuvastatin  40 mg Oral QHS  . tamsulosin  0.4 mg Oral Daily  . zinc sulfate  220 mg Oral Daily   Continuous Infusions:  PRN Meds:.guaiFENesin-dextromethorphan, lip balm, [DISCONTINUED] ondansetron **OR** ondansetron (ZOFRAN) IV, polyethylene glycol, sodium chloride   Time Spent in minutes  35   See all Orders from today for further details   Lala Lund M.D on 09/12/2020 at 9:06 AM  To page go to [email or web address] - use universal password  [facility] -  Office  [phone]    Objective:   Vitals:   09/11/20 2000 09/11/20  2159 09/12/20 0500 09/12/20 0552  BP: (!) 134/101 (!) 141/85  120/73  Pulse: 99 88  82  Resp: (!) 24 20  (!) 21  Temp:  97.9 F (36.6 C)  98 F (36.7 C)  TempSrc:  Axillary  Axillary  SpO2: 95% 93%  95%  Weight:   (!) 169.2 kg   Height:        Wt Readings from Last 3 Encounters:  09/12/20 (!) 169.2 kg  07/16/20 Marland Kitchen)  184 kg  04/28/20 (!) 180.1 kg     Intake/Output Summary (Last 24 hours) at 09/12/2020 0906 Last data filed at 09/11/2020 2000 Gross per 24 hour  Intake --  Output 550 ml  Net -550 ml     Physical Exam  Awake Alert, No new F.N deficits, Normal affect [place].AT,PERRAL Supple Neck,No JVD, No cervical lymphadenopathy appriciated.  Symmetrical Chest wall movement, Good air movement bilaterally, CTAB RRR,No Gallops, Rubs or new Murmurs, No Parasternal Heave +ve B.Sounds, Abd Soft, No tenderness, No organomegaly appriciated, No rebound - guarding or rigidity. No Cyanosis, Clubbing or edema, No new Rash or bruise    Data Review:    CBC Recent Labs  Lab 09/06/20 0504 09/07/20 1111 09/08/20 0122 09/09/20 0223 09/12/20 0317  WBC 15.4* 18.3* 17.5* 15.3* 14.4*  HGB 14.6 14.4 13.6 13.3 13.9  HCT 44.4 43.7 41.4 41.0 40.9  PLT 336 465* 478* 431* 321  MCV 84.7 86.0 85.0 85.8 84.9  MCH 27.9 28.3 27.9 27.8 28.8  MCHC 32.9 33.0 32.9 32.4 34.0  RDW 12.5 12.8 12.6 12.5 12.7  LYMPHSABS 1.3 0.9 2.0 1.4 3.4  MONOABS 0.5 0.8 1.1* 1.1* 1.4*  EOSABS 0.2 0.1 0.1 0.1 0.1  BASOSABS 0.0 0.0 0.0 0.0 0.0    Chemistries  Recent Labs  Lab 09/06/20 0504 09/07/20 1111 09/08/20 0122 09/09/20 0223 09/12/20 0317  NA 136 131* 136 134* 135  K 4.1 4.3 4.2 4.6 5.1  CL 99 98 100 99 99  CO2 27 23 26 26 26   GLUCOSE 121* 200* 64* 103* 136*  BUN 30* 28* 31* 29* 28*  CREATININE 1.65* 1.60* 1.74* 1.65* 1.65*  CALCIUM 8.5* 8.3* 8.7* 8.6* 8.8*  MG 2.4 2.5* 2.6* 2.5* QUANTITY NOT SUFFICIENT, UNABLE TO PERFORM TEST  AST 20 25 25 22 24   ALT 23 24 26 27 23   ALKPHOS 57 60 61 59 54   BILITOT 0.9 0.8 0.4 0.6 QUANTITY NOT SUFFICIENT, UNABLE TO PERFORM TEST   ------------------------------------------------------------------------------------------------------------------ No results for input(s): CHOL, HDL, LDLCALC, TRIG, CHOLHDL, LDLDIRECT in the last 72 hours.  Lab Results  Component Value Date   HGBA1C 7.9 (H) 08/23/2020   ------------------------------------------------------------------------------------------------------------------ No results for input(s): TSH, T4TOTAL, T3FREE, THYROIDAB in the last 72 hours.  Invalid input(s): FREET3 ------------------------------------------------------------------------------------------------------------------ No results for input(s): VITAMINB12, FOLATE, FERRITIN, TIBC, IRON, RETICCTPCT in the last 72 hours.  Coagulation profile No results for input(s): INR, PROTIME in the last 168 hours.  Recent Labs    09/11/20 0205 09/12/20 0317  DDIMER 1.43* 1.09*    Cardiac Enzymes No results for input(s): CKMB, TROPONINI, MYOGLOBIN in the last 168 hours.  Invalid input(s): CK ------------------------------------------------------------------------------------------------------------------    Component Value Date/Time   BNP 57.3 09/12/2020 0317    Micro Results No results found for this or any previous visit (from the past 240 hour(s)).  Radiology Reports DG Chest Port 1 View  Result Date: 09/11/2020 CLINICAL DATA:  Shortness breath, COVID-19 positive EXAM: PORTABLE CHEST 1 VIEW COMPARISON:  Portable exam 0841 hours compared to 09/09/2020 FINDINGS: Normal heart size mediastinal contours. Diffuse airspace infiltrates bilaterally consistent with multifocal pneumonia and COVID-19, probably little changed when accounting for differences in technique. No pleural effusion or pneumothorax. Osseous structures unremarkable. IMPRESSION: Persistent BILATERAL pulmonary infiltrates of COVID-19 pneumonia. Electronically Signed   By:  Lavonia Dana M.D.   On: 09/11/2020 08:53   DG Chest Port 1 View  Result Date: 09/09/2020 CLINICAL DATA:  Shortness of breath, COVID positive EXAM: PORTABLE CHEST 1 VIEW COMPARISON:  09/07/2020 FINDINGS: Low lung volumes. Persistent bilateral opacities with similar lung aeration no significant pleural effusion. No pneumothorax. Stable cardiomediastinal contours. IMPRESSION: Persistent extensive bilateral opacities with similar lung aeration. Electronically Signed   By: Macy Mis M.D.   On: 09/09/2020 08:06   DG Chest Port 1 View  Result Date: 09/07/2020 CLINICAL DATA:  COVID, hypoxia EXAM: PORTABLE CHEST 1 VIEW COMPARISON:  09/05/2020 FINDINGS: Extensive airspace disease bilaterally, worsening since prior study. Low lung volumes. No effusions or pneumothorax. IMPRESSION: Worsening bilateral airspace disease. Electronically Signed   By: Rolm Baptise M.D.   On: 09/07/2020 08:41   DG Chest Port 1 View  Result Date: 09/05/2020 CLINICAL DATA:  Shortness of breath and COVID-19 positivity EXAM: PORTABLE CHEST 1 VIEW COMPARISON:  09/02/2020 FINDINGS: Cardiac shadow is stable. Diffuse bilateral airspace opacities are again identified slightly improved when compared with the prior exam. No new focal abnormality is noted. No bony abnormality is seen. IMPRESSION: Slight improvement in bilateral airspace disease as described. Electronically Signed   By: Inez Catalina M.D.   On: 09/05/2020 09:11   DG Chest Port 1 View  Result Date: 09/02/2020 CLINICAL DATA:  Respiratory failure EXAM: PORTABLE CHEST 1 VIEW COMPARISON:  09/01/2020 FINDINGS: The lungs are symmetrically expanded. There is interval progression of asymmetric mid and lower lung zone pulmonary infiltrate, likely infectious or inflammatory in nature. No pneumothorax or pleural effusion. Cardiac size within normal limits. The pulmonary vascularity is normal. IMPRESSION: Progressive multifocal pulmonary infiltrate, likely infectious or inflammatory.  Electronically Signed   By: Fidela Salisbury MD   On: 09/02/2020 06:28   DG Chest Port 1 View  Result Date: 08/27/2020 CLINICAL DATA:  Shortness of breath. EXAM: PORTABLE CHEST 1 VIEW COMPARISON:  None. FINDINGS: Cardiomediastinal silhouette is accentuated by low lung volumes and AP portable technique. Low lung volumes with bilateral peripheral predominant airspace opacities. No visible pleural effusions or pneumothorax. No acute osseous abnormality. IMPRESSION: Low lung volumes with bilateral peripheral predominant airspace opacities, concerning for multifocal pneumonia. Recommend correlation with COVID testing. Electronically Signed   By: Margaretha Sheffield MD   On: 09/01/2020 11:59   DG Chest Port 1V same Day  Result Date: 09/01/2020 CLINICAL DATA:  Respiratory failure. Shortness of breath. History of COVID. EXAM: PORTABLE CHEST 1 VIEW COMPARISON:  08/19/2020. FINDINGS: Cardiomegaly. Diffuse bilateral pulmonary infiltrates/edema again noted. Low lung volumes. No pleural effusion or pneumothorax. No acute bony abnormality. IMPRESSION: Cardiomegaly. Diffuse bilateral pulmonary infiltrates/edema again noted. Congestive heart failure and or bilateral pneumonia could present in this fashion. Low lung volumes. Electronically Signed   By: Marcello Moores  Register   On: 09/01/2020 09:29   VAS [place] LOWER EXTREMITY VENOUS (DVT)  Result Date: 09/05/2020  Lower Venous DVT Study Indications: Elevated d-dimer, Covid.  Comparison Study: No prior studies. Performing Technologist: Darlin Coco, RDMS  Examination Guidelines: A complete evaluation includes B-mode imaging, spectral Doppler, color Doppler, and power Doppler as needed of all accessible portions of each vessel. Bilateral testing is considered an integral part of a complete examination. Limited examinations for reoccurring indications may be performed as noted. The reflux portion of the exam is performed with the patient in reverse Trendelenburg.   +---------+---------------+---------+-----------+----------+--------------+ RIGHT    CompressibilityPhasicitySpontaneityPropertiesThrombus Aging +---------+---------------+---------+-----------+----------+--------------+ CFV      Full           Yes      Yes                                 +---------+---------------+---------+-----------+----------+--------------+  SFJ      Full                                                        +---------+---------------+---------+-----------+----------+--------------+ FV Prox  Full                                                        +---------+---------------+---------+-----------+----------+--------------+ FV Mid   Full                                                        +---------+---------------+---------+-----------+----------+--------------+ FV DistalFull                                                        +---------+---------------+---------+-----------+----------+--------------+ PFV      Full                                                        +---------+---------------+---------+-----------+----------+--------------+ POP      Full           Yes      Yes                                 +---------+---------------+---------+-----------+----------+--------------+ PTV      Full                                                        +---------+---------------+---------+-----------+----------+--------------+ PERO     Full                                                        +---------+---------------+---------+-----------+----------+--------------+   +---------+---------------+---------+-----------+----------+--------------+ LEFT     CompressibilityPhasicitySpontaneityPropertiesThrombus Aging +---------+---------------+---------+-----------+----------+--------------+ CFV      Full           Yes      Yes                                  +---------+---------------+---------+-----------+----------+--------------+ SFJ      Full                                                        +---------+---------------+---------+-----------+----------+--------------+  FV Prox  Full                                                        +---------+---------------+---------+-----------+----------+--------------+ FV Mid   Full                                                        +---------+---------------+---------+-----------+----------+--------------+ FV DistalFull                                                        +---------+---------------+---------+-----------+----------+--------------+ PFV      Full                                                        +---------+---------------+---------+-----------+----------+--------------+ POP      Full           Yes      Yes                                 +---------+---------------+---------+-----------+----------+--------------+ PTV      Full                                                        +---------+---------------+---------+-----------+----------+--------------+ PERO     Full                                                        +---------+---------------+---------+-----------+----------+--------------+     Summary: RIGHT: - There is no evidence of deep vein thrombosis in the lower extremity.  - No cystic structure found in the popliteal fossa.  LEFT: - There is no evidence of deep vein thrombosis in the lower extremity.  - No cystic structure found in the popliteal fossa.  *See table(s) above for measurements and observations. Electronically signed by Harold Barban MD on 09/05/2020 at 5:31:25 PM.    Final    ECHOCARDIOGRAM LIMITED  Result Date: 08/31/2020    ECHOCARDIOGRAM LIMITED REPORT   Patient Name:   Robert [name] Date of Exam: 08/31/2020 Medical Rec #:  [id number]       Height:       73.0 in Accession #:    [id number]      Weight:        364.2 lb Date of Birth:  11/01/1955       BSA:          2.776 m Patient Age:    79 years  BP:           142/86 mmHg Patient Gender: M               HR:           101 bpm. Exam Location:  Inpatient Procedure: Limited Echo, Cardiac Doppler, Color Doppler and Intracardiac            Opacification Agent Indications:    Chest Pain 786.50/ R07.9  History:        Patient has no prior history of Echocardiogram examinations.                 Risk Factors:Hypertension, Diabetes and Non-Smoker. GERD.  Sonographer:    Vickie Epley RDCS Referring Phys: [id number] [facility] CHAND  Sonographer Comments: Covid positive. IMPRESSIONS  1. Left ventricular ejection fraction, by estimation, is 65 to 70%. The left ventricle has normal function. The left ventricle has no regional wall motion abnormalities. There is mild left ventricular hypertrophy. Left ventricular diastolic parameters are consistent with Grade I diastolic dysfunction (impaired relaxation).  2. Right ventricule is not well visualized but grossly normal size and systolic function. Tricuspid regurgitation signal is inadequate for assessing PA pressure.  3. The mitral valve is normal in structure. No evidence of mitral valve regurgitation.  4. The aortic valve is tricuspid. Aortic valve regurgitation is not visualized. No aortic stenosis is present.  5. Aortic dilatation noted. There is mild dilatation of the ascending aorta, measuring 39 mm. FINDINGS  Left Ventricle: Left ventricular ejection fraction, by estimation, is 65 to 70%. The left ventricle has normal function. The left ventricle has no regional wall motion abnormalities. Definity contrast agent was given IV to delineate the left ventricular  endocardial borders. The left ventricular internal cavity size was normal in size. There is mild left ventricular hypertrophy. Left ventricular diastolic parameters are consistent with Grade I diastolic dysfunction (impaired relaxation). Right Ventricle: The right ventricular  size is not well visualized. Right ventricular systolic function was not well visualized. Tricuspid regurgitation signal is inadequate for assessing PA pressure. Left Atrium: Left atrial size was normal in size. Pericardium: There is no evidence of pericardial effusion. Mitral Valve: The mitral valve is normal in structure. Tricuspid Valve: The tricuspid valve is normal in structure. Tricuspid valve regurgitation is not demonstrated. Aortic Valve: The aortic valve is tricuspid. Aortic valve regurgitation is not visualized. No aortic stenosis is present. Pulmonic Valve: The pulmonic valve was not well visualized. Pulmonic valve regurgitation is not visualized. Aorta: The aortic root is normal in size and structure and aortic dilatation noted. There is mild dilatation of the ascending aorta, measuring 39 mm. IAS/Shunts: The interatrial septum was not well visualized. LEFT VENTRICLE PLAX 2D LVIDd:         4.60 cm      Diastology LVIDs:         3.40 cm      LV e' medial:    6.42 cm/s LV PW:         1.10 cm      LV E/e' medial:  11.2 LV IVS:        1.10 cm      LV e' lateral:   9.25 cm/s LVOT diam:     2.60 cm      LV E/e' lateral: 7.7 LV SV:         106 LV SV Index:   38 LVOT Area:     5.31 cm  LV Volumes (MOD) LV vol d,  MOD A2C: 91.9 ml LV vol d, MOD A4C: 116.0 ml LV vol s, MOD A2C: 21.5 ml LV vol s, MOD A4C: 37.8 ml LV SV MOD A2C:     70.4 ml LV SV MOD A4C:     116.0 ml LV SV MOD BP:      72.2 ml LEFT ATRIUM           Index LA diam:      4.10 cm 1.48 cm/m LA Vol (A4C): 46.1 ml 16.61 ml/m  AORTIC VALVE LVOT Vmax:   108.00 cm/s LVOT Vmean:  70.200 cm/s LVOT VTI:    0.199 m  AORTA Ao Root diam: 3.80 cm Ao Asc diam:  3.90 cm MITRAL VALVE MV Area (PHT): 5.62 cm    SHUNTS MV Decel Time: 135 msec    Systemic VTI:  0.20 m MV E velocity: 71.60 cm/s  Systemic Diam: 2.60 cm MV A velocity: 90.00 cm/s MV E/A ratio:  0.80 Oswaldo Milian MD Electronically signed by Oswaldo Milian MD Signature Date/Time:  08/31/2020/12:03:23 PM    Final

## 2020-09-13 ENCOUNTER — Inpatient Hospital Stay (HOSPITAL_COMMUNITY): Payer: 59

## 2020-09-13 LAB — POCT I-STAT 7, (LYTES, BLD GAS, ICA,H+H)
Acid-base deficit: 11 mmol/L — ABNORMAL HIGH (ref 0.0–2.0)
Acid-base deficit: 12 mmol/L — ABNORMAL HIGH (ref 0.0–2.0)
Bicarbonate: 19.2 mmol/L — ABNORMAL LOW (ref 20.0–28.0)
Bicarbonate: 16 mmol/L — ABNORMAL LOW (ref 20.0–28.0)
Calcium, Ion: 1.13 mmol/L — ABNORMAL LOW (ref 1.15–1.40)
Calcium, Ion: 1.05 mmol/L — ABNORMAL LOW (ref 1.15–1.40)
HCT: 30 % — ABNORMAL LOW (ref 39.0–52.0)
HCT: 28 % — ABNORMAL LOW (ref 39.0–52.0)
Hemoglobin: 10.2 g/dL — ABNORMAL LOW (ref 13.0–17.0)
Hemoglobin: 9.5 g/dL — ABNORMAL LOW (ref 13.0–17.0)
O2 Saturation: 95 %
O2 Saturation: 96 %
Patient temperature: 98.6
Patient temperature: 97.7
Potassium: 4.7 mmol/L (ref 3.5–5.1)
Potassium: 5.2 mmol/L — ABNORMAL HIGH (ref 3.5–5.1)
Sodium: 137 mmol/L (ref 135–145)
Sodium: 138 mmol/L (ref 135–145)
TCO2: 21 mmol/L — ABNORMAL LOW (ref 22–32)
TCO2: 17 mmol/L — ABNORMAL LOW (ref 22–32)
pCO2 arterial: 68.1 mmHg (ref 32.0–48.0)
pCO2 arterial: 42.9 mmHg (ref 32.0–48.0)
pH, Arterial: 7.058 — CL (ref 7.350–7.450)
pH, Arterial: 7.178 — CL (ref 7.350–7.450)
pO2, Arterial: 106 mmHg (ref 83.0–108.0)
pO2, Arterial: 101 mmHg (ref 83.0–108.0)

## 2020-09-13 LAB — COMPREHENSIVE METABOLIC PANEL
ALT: 30 U/L (ref 0–44)
AST: 21 U/L (ref 15–41)
Albumin: 2.6 g/dL — ABNORMAL LOW (ref 3.5–5.0)
Alkaline Phosphatase: 55 U/L (ref 38–126)
Anion gap: 10 (ref 5–15)
BUN: 22 mg/dL (ref 8–23)
CO2: 27 mmol/L (ref 22–32)
Calcium: 8.8 mg/dL — ABNORMAL LOW (ref 8.9–10.3)
Chloride: 99 mmol/L (ref 98–111)
Creatinine, Ser: 1.54 mg/dL — ABNORMAL HIGH (ref 0.61–1.24)
GFR, Estimated: 51 mL/min — ABNORMAL LOW (ref >60)
Glucose, Bld: 148 mg/dL — ABNORMAL HIGH (ref 70–99)
Potassium: 4.5 mmol/L (ref 3.5–5.1)
Sodium: 136 mmol/L (ref 135–145)
Total Bilirubin: 0.7 mg/dL (ref 0.3–1.2)
Total Protein: 6.6 g/dL (ref 6.5–8.1)

## 2020-09-13 LAB — CBC WITH DIFFERENTIAL/PLATELET
Abs Immature Granulocytes: 0.13 10*3/uL — ABNORMAL HIGH (ref 0.00–0.07)
Basophils Absolute: 0 10*3/uL (ref 0.0–0.1)
Basophils Relative: 0 %
Eosinophils Absolute: 0 10*3/uL (ref 0.0–0.5)
Eosinophils Relative: 0 %
HCT: 39.9 % (ref 39.0–52.0)
Hemoglobin: 13.4 g/dL (ref 13.0–17.0)
Immature Granulocytes: 1 %
Lymphocytes Relative: 19 %
Lymphs Abs: 2.9 10*3/uL (ref 0.7–4.0)
MCH: 28.9 pg (ref 26.0–34.0)
MCHC: 33.6 g/dL (ref 30.0–36.0)
MCV: 86 fL (ref 80.0–100.0)
Monocytes Absolute: 1.2 10*3/uL — ABNORMAL HIGH (ref 0.1–1.0)
Monocytes Relative: 8 %
Neutro Abs: 11.2 10*3/uL — ABNORMAL HIGH (ref 1.7–7.7)
Neutrophils Relative %: 72 %
Platelets: 386 10*3/uL (ref 150–400)
RBC: 4.64 MIL/uL (ref 4.22–5.81)
RDW: 12.6 % (ref 11.5–15.5)
WBC: 15.5 10*3/uL — ABNORMAL HIGH (ref 4.0–10.5)
nRBC: 0 % (ref 0.0–0.2)

## 2020-09-13 LAB — GLUCOSE, CAPILLARY
Glucose-Capillary: 150 mg/dL — ABNORMAL HIGH (ref 70–99)
Glucose-Capillary: 267 mg/dL — ABNORMAL HIGH (ref 70–99)
Glucose-Capillary: 274 mg/dL — ABNORMAL HIGH (ref 70–99)
Glucose-Capillary: 392 mg/dL — ABNORMAL HIGH (ref 70–99)
Glucose-Capillary: 381 mg/dL — ABNORMAL HIGH (ref 70–99)
Glucose-Capillary: 358 mg/dL — ABNORMAL HIGH (ref 70–99)
Glucose-Capillary: 376 mg/dL — ABNORMAL HIGH (ref 70–99)
Glucose-Capillary: 324 mg/dL — ABNORMAL HIGH (ref 70–99)

## 2020-09-13 LAB — MAGNESIUM: Magnesium: 2.3 mg/dL (ref 1.7–2.4)

## 2020-09-13 LAB — BRAIN NATRIURETIC PEPTIDE: B Natriuretic Peptide: 24.5 pg/mL (ref 0.0–100.0)

## 2020-09-13 LAB — C-REACTIVE PROTEIN: CRP: 0.6 mg/dL (ref <1.0)

## 2020-09-13 LAB — TROPONIN I (HIGH SENSITIVITY): Troponin I (High Sensitivity): 40 ng/L — ABNORMAL HIGH (ref <18)

## 2020-09-13 LAB — D-DIMER, QUANTITATIVE: D-Dimer, Quant: 0.92 ug/mL-FEU — ABNORMAL HIGH (ref 0.00–0.50)

## 2020-09-13 MED ORDER — INSULIN DETEMIR 100 UNIT/ML ~~LOC~~ SOLN
20.0000 [IU] | Freq: Two times a day (BID) | SUBCUTANEOUS | Status: DC
  Administered 2020-09-13: 23:00:00 20 [IU] via SUBCUTANEOUS
  Filled 2020-09-13 (×2): qty 0.2

## 2020-09-13 MED ORDER — MIDAZOLAM HCL 2 MG/2ML IJ SOLN: 2.0000 mg | INTRAMUSCULAR | Status: DC | PRN

## 2020-09-13 MED ORDER — MIDAZOLAM HCL 2 MG/2ML IJ SOLN
INTRAMUSCULAR | Status: AC
  Administered 2020-09-13: 19:00:00 1 mg
  Filled 2020-09-13: qty 4

## 2020-09-13 MED ORDER — DOCUSATE SODIUM 50 MG/5ML PO LIQD
100.0000 mg | Freq: Two times a day (BID) | ORAL | Status: DC
  Administered 2020-09-14: 09:00:00 100 mg
  Filled 2020-09-13: qty 10

## 2020-09-13 MED ORDER — METOPROLOL TARTRATE 50 MG PO TABS: 100.0000 mg | ORAL_TABLET | Freq: Two times a day (BID) | ORAL | Status: DC

## 2020-09-13 MED ORDER — FENTANYL 2500MCG IN NS 250ML (10MCG/ML) PREMIX INFUSION: 0.0000 ug/h | INTRAVENOUS | Status: DC

## 2020-09-13 MED ORDER — EPINEPHRINE HCL 5 MG/250ML IV SOLN IN NS
0.5000 ug/min | INTRAVENOUS | Status: DC
  Administered 2020-09-13: 0.5 ug/min via INTRAVENOUS
  Administered 2020-09-14 (×4): 20 ug/min via INTRAVENOUS
  Filled 2020-09-13 (×4): qty 250

## 2020-09-13 MED ORDER — SODIUM BICARBONATE 8.4 % IV SOLN
INTRAVENOUS | Status: AC
  Administered 2020-09-13: 21:00:00 50 meq
  Filled 2020-09-13: qty 100

## 2020-09-13 MED ORDER — SODIUM BICARBONATE 8.4 % IV SOLN
INTRAVENOUS | Status: AC
  Administered 2020-09-13: 21:00:00 50 meq
  Filled 2020-09-13: qty 50

## 2020-09-13 MED ORDER — STERILE WATER FOR INJECTION IV SOLN
INTRAVENOUS | Status: DC
  Filled 2020-09-13 (×4): qty 850

## 2020-09-13 MED ORDER — FUROSEMIDE 10 MG/ML IJ SOLN
60.0000 mg | INTRAMUSCULAR | Status: DC
  Filled 2020-09-13: qty 6

## 2020-09-13 MED ORDER — LORAZEPAM 2 MG/ML IJ SOLN: 1.0000 mg | Freq: Once | INTRAMUSCULAR | Status: DC | PRN

## 2020-09-13 MED ORDER — DILTIAZEM HCL 25 MG/5ML IV SOLN
10.0000 mg | INTRAVENOUS | Status: AC
  Administered 2020-09-13: 16:00:00 10 mg via INTRAVENOUS

## 2020-09-13 MED ORDER — FUROSEMIDE 40 MG PO TABS: 40.0000 mg | ORAL_TABLET | Freq: Once | ORAL | Status: DC

## 2020-09-13 MED ORDER — SODIUM BICARBONATE 8.4 % IV SOLN
50.0000 meq | Freq: Once | INTRAVENOUS | Status: AC
  Administered 2020-09-13: 22:00:00 50 meq via INTRAVENOUS

## 2020-09-13 MED ORDER — ENOXAPARIN SODIUM 100 MG/ML ~~LOC~~ SOLN
85.0000 mg | Freq: Two times a day (BID) | SUBCUTANEOUS | Status: DC
  Filled 2020-09-13 (×2): qty 0.85

## 2020-09-13 MED ORDER — PHENYLEPHRINE 40 MCG/ML (10ML) SYRINGE FOR IV PUSH (FOR BLOOD PRESSURE SUPPORT)
PREFILLED_SYRINGE | INTRAVENOUS | Status: AC
  Administered 2020-09-13: 19:00:00 400 ug
  Filled 2020-09-13: qty 20

## 2020-09-13 MED ORDER — FUROSEMIDE 10 MG/ML IJ SOLN
INTRAMUSCULAR | Status: AC
  Administered 2020-09-13: 16:00:00 40 mg
  Filled 2020-09-13: qty 4

## 2020-09-13 MED ORDER — ACETAMINOPHEN 325 MG PO TABS
650.0000 mg | ORAL_TABLET | Freq: Four times a day (QID) | ORAL | Status: DC | PRN
  Administered 2020-09-13: 650 mg via ORAL
  Filled 2020-09-13: qty 2

## 2020-09-13 MED ORDER — VASOPRESSIN 20 UNITS/100 ML INFUSION FOR SHOCK
0.0000 [IU]/min | INTRAVENOUS | Status: DC
  Administered 2020-09-13 – 2020-09-14 (×2): 0.03 [IU]/min via INTRAVENOUS
  Filled 2020-09-13 (×2): qty 100

## 2020-09-13 MED ORDER — NOREPINEPHRINE 16 MG/250ML-% IV SOLN
0.0000 ug/min | INTRAVENOUS | Status: DC
  Administered 2020-09-13: 20:00:00 10 ug/min via INTRAVENOUS
  Administered 2020-09-14: 08:00:00 80 ug/min via INTRAVENOUS
  Administered 2020-09-14: 60 ug/min via INTRAVENOUS
  Administered 2020-09-14: 11:00:00 21.333 ug/min via INTRAVENOUS
  Administered 2020-09-14: 05:00:00 80 ug/min via INTRAVENOUS
  Filled 2020-09-13 (×5): qty 250

## 2020-09-13 MED ORDER — METHYLPREDNISOLONE SODIUM SUCC 40 MG IJ SOLR
40.0000 mg | Freq: Two times a day (BID) | INTRAMUSCULAR | Status: DC
  Administered 2020-09-14: 03:00:00 40 mg via INTRAVENOUS
  Filled 2020-09-13: qty 1

## 2020-09-13 MED ORDER — ROCURONIUM BROMIDE 10 MG/ML (PF) SYRINGE
PREFILLED_SYRINGE | INTRAVENOUS | Status: AC
  Administered 2020-09-13: 19:00:00 50 mg
  Filled 2020-09-13: qty 10

## 2020-09-13 MED ORDER — FUROSEMIDE 10 MG/ML IJ SOLN
INTRAMUSCULAR | Status: AC
  Administered 2020-09-13: 16:00:00 60 mg
  Filled 2020-09-13: qty 6

## 2020-09-13 MED ORDER — ALTEPLASE (PULMONARY EMBOLISM) INFUSION
100.0000 mg | Freq: Once | INTRAVENOUS | Status: AC
  Administered 2020-09-13: 22:00:00 100 mg via INTRAVENOUS
  Filled 2020-09-13: qty 100

## 2020-09-13 MED ORDER — EPINEPHRINE 1 MG/10ML IJ SOSY
PREFILLED_SYRINGE | INTRAMUSCULAR | Status: AC
  Administered 2020-09-13: 19:00:00 1 mg
  Filled 2020-09-13: qty 20

## 2020-09-13 MED ORDER — FENTANYL 2500MCG IN NS 250ML (10MCG/ML) PREMIX INFUSION
50.0000 ug/h | INTRAVENOUS | Status: DC
  Administered 2020-09-13: 21:00:00 100 ug/h via INTRAVENOUS
  Filled 2020-09-13: qty 250

## 2020-09-13 MED ORDER — FENTANYL CITRATE (PF) 100 MCG/2ML IJ SOLN
INTRAMUSCULAR | Status: AC
  Administered 2020-09-13: 19:00:00 50 ug
  Filled 2020-09-13: qty 2

## 2020-09-13 MED ORDER — POLYETHYLENE GLYCOL 3350 17 G PO PACK
17.0000 g | PACK | Freq: Every day | ORAL | Status: DC
  Administered 2020-09-14: 09:00:00 17 g
  Filled 2020-09-13: qty 1

## 2020-09-13 MED ORDER — SODIUM BICARBONATE 8.4 % IV SOLN
INTRAVENOUS | Status: AC
  Administered 2020-09-13: 19:00:00 50 meq
  Filled 2020-09-13: qty 100

## 2020-09-13 MED ORDER — SODIUM BICARBONATE 8.4 % IV SOLN
100.0000 meq | Freq: Once | INTRAVENOUS | Status: AC
  Administered 2020-09-13: 22:00:00 100 meq via INTRAVENOUS

## 2020-09-13 MED ORDER — EPINEPHRINE HCL 5 MG/250ML IV SOLN IN NS
INTRAVENOUS | Status: AC
  Filled 2020-09-13: qty 250

## 2020-09-13 MED ORDER — ETOMIDATE 2 MG/ML IV SOLN
INTRAVENOUS | Status: AC
  Administered 2020-09-13: 19:00:00 20 mg
  Filled 2020-09-13: qty 20

## 2020-09-13 MED ORDER — ATROPINE SULFATE 1 MG/10ML IJ SOSY
PREFILLED_SYRINGE | INTRAMUSCULAR | Status: AC
  Filled 2020-09-13: qty 20

## 2020-09-13 MED ORDER — SODIUM BICARBONATE 8.4 % IV SOLN
100.0000 meq | Freq: Once | INTRAVENOUS | Status: AC
  Administered 2020-09-13: 100 meq via INTRAVENOUS

## 2020-09-13 MED ORDER — INSULIN ASPART 100 UNIT/ML ~~LOC~~ SOLN
0.0000 [IU] | SUBCUTANEOUS | Status: DC
  Administered 2020-09-13: 22:00:00 20 [IU] via SUBCUTANEOUS
  Administered 2020-09-13: 15 [IU] via SUBCUTANEOUS

## 2020-09-13 MED ORDER — SODIUM CHLORIDE 0.9 % IV BOLUS
1000.0000 mL | Freq: Once | INTRAVENOUS | Status: AC
  Administered 2020-09-13: 21:00:00 1000 mL via INTRAVENOUS

## 2020-09-13 MED ORDER — SODIUM BICARBONATE 8.4 % IV SOLN
INTRAVENOUS | Status: AC
  Filled 2020-09-13: qty 100

## 2020-09-13 MED ORDER — SODIUM CHLORIDE 0.9 % IV SOLN: 250.0000 mL | Freq: Once | INTRAVENOUS | Status: DC

## 2020-09-13 MED ORDER — ENOXAPARIN SODIUM 100 MG/ML ~~LOC~~ SOLN: 85.0000 mg | Freq: Two times a day (BID) | SUBCUTANEOUS | Status: DC

## 2020-09-13 MED ORDER — FENTANYL BOLUS VIA INFUSION
50.0000 ug | INTRAVENOUS | Status: DC | PRN
  Filled 2020-09-13: qty 50

## 2020-09-13 MED ORDER — FENTANYL CITRATE (PF) 100 MCG/2ML IJ SOLN: 50.0000 ug | Freq: Once | INTRAMUSCULAR | Status: DC

## 2020-09-13 MED ORDER — FUROSEMIDE 10 MG/ML IJ SOLN: 40.0000 mg | INTRAMUSCULAR | Status: DC

## 2020-09-13 NOTE — Progress Notes (Signed)
PCCM INTERVAL PROGRESS NOTE  Called to bedside for refractory shock despite Levophed 40 and shock dose vaso.  ABG showing mixed acidosis. Respiratory rate increased. Bicarb given with positive response. Drip started. Epinephrine infusion ordered, currently not indicated.   On exam diminished breath sounds on the left. Post intubatation CXR reviewed with adequate tube position. Will repeat CXR to rule out pneumothorax.  Thrombolytics have been ordered given high suspicion for PE per Dr Bari Mantis workup. These are yet to be given due to inadequate IV access PIV needed per pharmacy.   Plan: CXR now Vent settings changed to increase Vt.  Bicarb infusion Epi infusion Thrombolytic administration pending.  Repeat ABG midnight.    Robert [name], AGACNP-BC [place]  See Amion for personal pager PCCM on call pager [phone]  09/13/2020 9:10 PM

## 2020-09-13 NOTE — Progress Notes (Signed)
[facility] Progress Note Patient Name: Robert [name] DOB: 1956/07/01 MRN: [id number]   Date of Service  09/13/2020  HPI/Events of Note  ABG on 100%/PRVC 24/TV 490/P 12 = 7.058/68.1/106.   eICU Interventions  Plan: 1. Increase PRVC rate to 35. 2. NaHCO3 141meq IV now.  3. NaHCO3 IV infusion to run at 100 mL/hour. 4. Repeat ABG at 12 midnight.      Intervention Category Major Interventions: Respiratory failure - evaluation and management;Acid-Base disturbance - evaluation and management  [name] 09/13/2020, 9:04 PM

## 2020-09-13 NOTE — Progress Notes (Signed)
[facility] Progress Note Patient Name: Robert [name] DOB: 1956/02/26 MRN: [id number]   Date of Service  09/13/2020  HPI/Events of Note  Hypotension - BP = 71/37 with MAP = 42.   eICU Interventions  Plan: 1. Repeat ABG STAT.      Intervention Category Major Interventions: Hypotension - evaluation and management  Lysle Dingwall 09/13/2020, 10:52 PM

## 2020-09-13 NOTE — Progress Notes (Signed)
eLink Physician-Brief Progress Note Patient Name: Robert [name] DOB: Jan 27, 1956 MRN: [id number]   Date of Service  09/13/2020  HPI/Events of Note  Hyperglycemia - patient currently on AC/HS resistant Novolog SSI + Levemir 30 units [place] Q  12 hours. Now intubated and ventilated and NPO.   eICU Interventions  Plan: 1. Will change to Q 4 hour resistant SSI. 2. Will decrease Levemir dose  to 20 units [place] Q 12 hours.      Intervention Category Major Interventions: Hyperglycemia - active titration of insulin therapy  Lysle Dingwall 09/13/2020, 7:57 PM

## 2020-09-13 NOTE — Progress Notes (Addendum)
PROGRESS NOTE                                                                                                                                                                                                             Patient Demographics:    Robert [name], is a 64 y.o. male, DOB - 1956/09/30, RFF:[id number]  Outpatient Primary MD for the patient is Doree Albee, MD   Admit date - 08/05/2020   LOS - 77  Chief Complaint  Patient presents with  . hxpoxia       Brief Narrative: Patient is a 64 y.o. male with PMHx of CKD stage IIIb, DM-2, HTN, morbid obesity, BPH-who was admitted by PCCM on 11/28 due to severe hypoxic respiratory failure due to COVID-19 pneumonia.   Patient apparently had attended a funeral-where he apparently may have been exposed to COVID-19 infection.  COVID-19 vaccinated status: Vaccinated (second Pfizer vaccine dose> 6 months back-no booster yet)  Significant Events: 11/28>> Admit to [facility] for severe hypoxia due to COVID-19 pneumonia  Significant studies: 11/28>>Chest x-ray: Bilateral airspace opacities 11/29>> Echo: EF 35-70%, grade 1 diastolic dysfunction. 11/30>> chest x-ray: Diffuse bilateral pulmonary infiltrates. 12/1>> chest x-ray: Progressive multifocal pulmonary infiltrates.  COVID-19 medications: Steroids: 11/28>> Remdesivir: 11/28>> 12/2 Baricitinib: 11/28>>12/10  Antibiotics: Rocephin: 11/28 x 1 Zithromax: 11/28 x 1  Microbiology data: 11/28 >>blood culture: No growth  Procedures: None  Consults: None  DVT prophylaxis: Prophylactic Lovenox    Subjective:   Patient in bed, appears comfortable, denies any headache, no fever, no chest pain or pressure, no shortness of breath at rest but severe shortness of breath on minimal exertion, no abdominal pain. No focal weakness.   Assessment  & Plan :     Addendum on 09/13/2020 at 3:30 PM.  Patient suddenly became  severely hypoxic he has severe Covid 19 related lung injury, he has finished his traditional COVID-19 treatment with full dose of Baricitinib, remdesivir, steroids being tapered down, he was also prophylactically treated with full dose Lovenox till his D-dimer came below 2 and is now serially going down.  Leg venous duplex was negative.  He has no chest pain, EKG was nonacute, chest x-ray this morning showed evidence of COVID-19 pneumonia but no changes.  Upon examination patient appears to be diaphoretic, tired severely hypoxic.  Have placed him on 50 L  of heated high flow oxygen with nonrebreather, subsequently he feels better oxygenation has improved, still appears quite fatigued.  EKG shows sinus tachycardia, repeat chest x-ray ordered, 100 mg of IV Lasix given.  Will transfer him to ICU immediately for close monitoring.  As above he has finished all COVID-19 related specific treatment, I question if we should again put him back empirically again on full dose Lovenox and some IV steroids instead of oral for residual inflammation.  Will defer this to ICU attending.  Wife updated over the phone.     acute Hypoxic Resp Failure due to Covid 19 Viral pneumonia: he has moderate to severe parenchymal lung injury with breakthrough infection, he received 2 doses of Pfizer vaccine and the second shot was 6 months ago.  Had not received his third shot.  Is been treated with combination of Steroids, Remdesivir and Baricitinib.  Is now showing signs of recovery.  Monitor closely.  Encouraged to sit up in chair use I-S and flutter valve for pulmonary toiletry. At rest he looks quite stable on 3 to 4 L nasal cannula oxygen but still extremely hypoxic on minimal exertion requiring over 15 L of oxygen.  Continue to monitor closely, as exertional oxygen demand comes down will prepare for discharge.  So far requiring a lot of oxygen upon minimal exertion which cannot be provided at home.  O2 requirements:  SpO2: (!) 88  % O2 Flow Rate (L/min): 15 L/min FiO2 (%): 100 %   Prone/Incentive Spirometry: encouraged patient to lie prone for 3-4 hours at a time for a total of 16 hours a day, and to encourage incentive spirometry use 3-4/hour.   Recent Labs  Lab 09/07/20 1111 09/07/20 1112 09/08/20 0122 09/09/20 0223 09/11/20 0205 09/12/20 0317 09/13/20 0500  WBC 18.3*  --  17.5* 15.3*  --  14.4* 15.5*  HGB 14.4  --  13.6 13.3  --  13.9 13.4  HCT 43.7  --  41.4 41.0  --  40.9 39.9  PLT 465*  --  478* 431*  --  321 386  CRP 11.3*  --  9.7* 8.8*  --  0.6 0.6  BNP  --  43.2 26.0 32.1  --  57.3 24.5  DDIMER 3.56*  --  2.33* 2.47* 1.43* 1.09* 0.92*  AST 25  --  25 22  --  24 21  ALT 24  --  26 27  --  23 30  ALKPHOS 60  --  61 59  --  54 55  BILITOT 0.8  --  0.4 0.6  --  QUANTITY NOT SUFFICIENT, UNABLE TO PERFORM TEST 0.7  ALBUMIN 2.4*  --  2.3* 2.2*  --  2.5* 2.6*     Elevated D-dimer: Secondary to COVID-19 related infection- -ve Doppler.  Switched him to therapeutic Lovenox on 09/05/2020 & cut down to high prophylactic dose on 09/11/2020 as D-dimer is below 2 now, 2 Weeks of prophylactic Eliquis upon discharge.  AKI on CKD stage IIIb: AKI likely hemodynamically mediated-improving with supportive care.  Follow electrolytes closely. Baseline creatinine seems to be close to 1.7.  Mild incidental ascending aortic dilation on echocardiogram.  Follow with PCP and cardiology in 2 to 3 weeks post discharge.    HTN: BP controlled-continue amlodipine.  HLD: Continue statin  BPH: Continue Flomax  Presumed OSA: On HFNC - we will need outpatient sleep study.  Morbid obesity - BMI 50, follow with PCP.  DM-2 (A1c 7.9 on 11/28) with uncontrolled hyperglycemia due to steroids:  CBGs improving-Insulin adjusted 09/10/20 due to steroid taper..  Recent Labs    09/05/20 1650 09/05/20 2038 09/06/20 0822  GLUCAP 228* 227* 166*      GI prophylaxis: PPI  Condition - Fair  Family Communication  :  Previous MD  -  Spouse Briscoe Burns [phone])  on 09/03/20, I spoke -  09/06/20, 09/10/20   Code Status :  Full Code  Diet :  Diet Order            Diet Carb Modified Fluid consistency: Thin; Room service appropriate? Yes  Diet effective now                  Disposition Plan  :  Status is: Inpatient  Remains inpatient appropriate because:Inpatient level of care appropriate due to severity of illness  Dispo: The patient is from: Home              Anticipated d/c is to: Home              Anticipated d/c date is: > 3 days              Patient currently is not medically stable to d/c.   Barriers to discharge: Hypoxia requiring O2 supplementation/complete 5 days of IV Remdesivir  Antimicorbials  :    Anti-infectives (From admission, onward)   Start     Dose/Rate Route Frequency Ordered Stop   08/31/20 1000  remdesivir 100 mg in sodium chloride 0.9 % 100 mL IVPB  Status:  Discontinued       "Followed by" Linked Group Details   100 mg 200 mL/hr over 30 Minutes Intravenous Daily 08/27/2020 2023 08/14/2020 2113   08/31/20 1000  remdesivir 100 mg in sodium chloride 0.9 % 100 mL IVPB        100 mg 200 mL/hr over 30 Minutes Intravenous Daily 08/09/2020 1428 09/03/20 1057   08/28/2020 2022  remdesivir 200 mg in sodium chloride 0.9% 250 mL IVPB  Status:  Discontinued       "Followed by" Linked Group Details   200 mg 580 mL/hr over 30 Minutes Intravenous Once 09/01/2020 2023 08/03/2020 2113   08/05/2020 1430  remdesivir 100 mg in sodium chloride 0.9 % 100 mL IVPB        100 mg 200 mL/hr over 30 Minutes Intravenous Every 30 min 08/18/2020 1428 08/18/2020 1639   08/22/2020 1145  cefTRIAXone (ROCEPHIN) 2 g in sodium chloride 0.9 % 100 mL IVPB        2 g 200 mL/hr over 30 Minutes Intravenous  Once 08/27/2020 1136 08/29/2020 1409   08/16/2020 1145  azithromycin (ZITHROMAX) 500 mg in sodium chloride 0.9 % 250 mL IVPB  Status:  Discontinued        500 mg 250 mL/hr over 60 Minutes Intravenous Every 24 hours 08/23/2020 1136  08/31/20 0853      Inpatient Medications  Scheduled Meds: . amLODipine  10 mg Oral Daily  . vitamin C  500 mg Oral Daily  . Chlorhexidine Gluconate Cloth  6 each Topical Daily  . docusate sodium  100 mg Oral BID  . enoxaparin (LOVENOX) injection  85 mg Subcutaneous Q12H  . famotidine  20 mg Oral BID  . influenza vac split quadrivalent PF  0.5 mL Intramuscular Tomorrow-1000  . insulin aspart  0-20 Units Subcutaneous TID WC  . insulin aspart  0-5 Units Subcutaneous QHS  . insulin aspart  6 Units Subcutaneous TID WC  . insulin detemir  30 Units  Subcutaneous BID  . pantoprazole  40 mg Oral Q1200  . predniSONE  40 mg Oral Daily  . rosuvastatin  40 mg Oral QHS  . tamsulosin  0.4 mg Oral Daily  . zinc sulfate  220 mg Oral Daily   Continuous Infusions:  PRN Meds:.acetaminophen, guaiFENesin-dextromethorphan, lip balm, [DISCONTINUED] ondansetron **OR** ondansetron (ZOFRAN) IV, polyethylene glycol, sodium chloride   Time Spent in minutes  35   See all Orders from today for further details   Lala Lund M.D on 09/13/2020 at 10:27 AM  To page go to [email or web address] - use universal password  [facility] -  Office  [phone]    Objective:   Vitals:   09/12/20 2124 09/13/20 0406 09/13/20 0452 09/13/20 0909  BP:   (!) 140/91   Pulse:   73   Resp:   (!) 24   Temp: 98.4 F (36.9 C)  98.1 F (36.7 C)   TempSrc: Oral  Oral   SpO2: 90%  99% (!) 88%  Weight:  (!) 165.2 kg    Height:        Wt Readings from Last 3 Encounters:  09/13/20 (!) 165.2 kg  07/16/20 (!) 184 kg  04/28/20 (!) 180.1 kg     Intake/Output Summary (Last 24 hours) at 09/13/2020 1027 Last data filed at 09/13/2020 0757 Gross per 24 hour  Intake 240 ml  Output 450 ml  Net -210 ml     Physical Exam  Awake Alert, No new F.N deficits, Normal affect .AT,PERRAL Supple Neck,No JVD, No cervical lymphadenopathy appriciated.  Symmetrical Chest wall movement, Good air movement bilaterally,  CTAB RRR,No Gallops, Rubs or new Murmurs, No Parasternal Heave +ve B.Sounds, Abd Soft, No tenderness, No organomegaly appriciated, No rebound - guarding or rigidity. No Cyanosis, Clubbing or edema, No new Rash or bruise    Data Review:    CBC Recent Labs  Lab 09/07/20 1111 09/08/20 0122 09/09/20 0223 09/12/20 0317 09/13/20 0500  WBC 18.3* 17.5* 15.3* 14.4* 15.5*  HGB 14.4 13.6 13.3 13.9 13.4  HCT 43.7 41.4 41.0 40.9 39.9  PLT 465* 478* 431* 321 386  MCV 86.0 85.0 85.8 84.9 86.0  MCH 28.3 27.9 27.8 28.8 28.9  MCHC 33.0 32.9 32.4 34.0 33.6  RDW 12.8 12.6 12.5 12.7 12.6  LYMPHSABS 0.9 2.0 1.4 3.4 2.9  MONOABS 0.8 1.1* 1.1* 1.4* 1.2*  EOSABS 0.1 0.1 0.1 0.1 0.0  BASOSABS 0.0 0.0 0.0 0.0 0.0    Chemistries  Recent Labs  Lab 09/07/20 1111 09/08/20 0122 09/09/20 0223 09/12/20 0317 09/13/20 0500  NA 131* 136 134* 135 136  K 4.3 4.2 4.6 5.1 4.5  CL 98 100 99 99 99  CO2 23 26 26 26 27   GLUCOSE 200* 64* 103* 136* 148*  BUN 28* 31* 29* 28* 22  CREATININE 1.60* 1.74* 1.65* 1.65* 1.54*  CALCIUM 8.3* 8.7* 8.6* 8.8* 8.8*  MG 2.5* 2.6* 2.5* QUANTITY NOT SUFFICIENT, UNABLE TO PERFORM TEST 2.3  AST 25 25 22 24 21   ALT 24 26 27 23 30   ALKPHOS 60 61 59 54 55  BILITOT 0.8 0.4 0.6 QUANTITY NOT SUFFICIENT, UNABLE TO PERFORM TEST 0.7   ------------------------------------------------------------------------------------------------------------------ No results for input(s): CHOL, HDL, LDLCALC, TRIG, CHOLHDL, LDLDIRECT in the last 72 hours.  Lab Results  Component Value Date   HGBA1C 7.9 (H) 08/05/2020   ------------------------------------------------------------------------------------------------------------------ No results for input(s): TSH, T4TOTAL, T3FREE, THYROIDAB in the last 72 hours.  Invalid input(s): FREET3 ------------------------------------------------------------------------------------------------------------------ No results for input(s): VITAMINB12, FOLATE,  FERRITIN,  TIBC, IRON, RETICCTPCT in the last 72 hours.  Coagulation profile No results for input(s): INR, PROTIME in the last 168 hours.  Recent Labs    09/12/20 0317 09/13/20 0500  DDIMER 1.09* 0.92*    Cardiac Enzymes No results for input(s): CKMB, TROPONINI, MYOGLOBIN in the last 168 hours.  Invalid input(s): CK ------------------------------------------------------------------------------------------------------------------    Component Value Date/Time   BNP 24.5 09/13/2020 0500    Micro Results No results found for this or any previous visit (from the past 240 hour(s)).  Radiology Reports DG Chest Port 1 View  Result Date: 09/11/2020 CLINICAL DATA:  Shortness breath, COVID-19 positive EXAM: PORTABLE CHEST 1 VIEW COMPARISON:  Portable exam 0841 hours compared to 09/09/2020 FINDINGS: Normal heart size mediastinal contours. Diffuse airspace infiltrates bilaterally consistent with multifocal pneumonia and COVID-19, probably little changed when accounting for differences in technique. No pleural effusion or pneumothorax. Osseous structures unremarkable. IMPRESSION: Persistent BILATERAL pulmonary infiltrates of COVID-19 pneumonia. Electronically Signed   By: Lavonia Dana M.D.   On: 09/11/2020 08:53   DG Chest Port 1 View  Result Date: 09/09/2020 CLINICAL DATA:  Shortness of breath, COVID positive EXAM: PORTABLE CHEST 1 VIEW COMPARISON:  09/07/2020 FINDINGS: Low lung volumes. Persistent bilateral opacities with similar lung aeration no significant pleural effusion. No pneumothorax. Stable cardiomediastinal contours. IMPRESSION: Persistent extensive bilateral opacities with similar lung aeration. Electronically Signed   By: Macy Mis M.D.   On: 09/09/2020 08:06   DG Chest Port 1 View  Result Date: 09/07/2020 CLINICAL DATA:  COVID, hypoxia EXAM: PORTABLE CHEST 1 VIEW COMPARISON:  09/05/2020 FINDINGS: Extensive airspace disease bilaterally, worsening since prior study. Low lung  volumes. No effusions or pneumothorax. IMPRESSION: Worsening bilateral airspace disease. Electronically Signed   By: Rolm Baptise M.D.   On: 09/07/2020 08:41   DG Chest Port 1 View  Result Date: 09/05/2020 CLINICAL DATA:  Shortness of breath and COVID-19 positivity EXAM: PORTABLE CHEST 1 VIEW COMPARISON:  09/02/2020 FINDINGS: Cardiac shadow is stable. Diffuse bilateral airspace opacities are again identified slightly improved when compared with the prior exam. No new focal abnormality is noted. No bony abnormality is seen. IMPRESSION: Slight improvement in bilateral airspace disease as described. Electronically Signed   By: Inez Catalina M.D.   On: 09/05/2020 09:11   DG Chest Port 1 View  Result Date: 09/02/2020 CLINICAL DATA:  Respiratory failure EXAM: PORTABLE CHEST 1 VIEW COMPARISON:  09/01/2020 FINDINGS: The lungs are symmetrically expanded. There is interval progression of asymmetric mid and lower lung zone pulmonary infiltrate, likely infectious or inflammatory in nature. No pneumothorax or pleural effusion. Cardiac size within normal limits. The pulmonary vascularity is normal. IMPRESSION: Progressive multifocal pulmonary infiltrate, likely infectious or inflammatory. Electronically Signed   By: Fidela Salisbury MD   On: 09/02/2020 06:28   DG Chest Port 1 View  Result Date: 08/22/2020 CLINICAL DATA:  Shortness of breath. EXAM: PORTABLE CHEST 1 VIEW COMPARISON:  None. FINDINGS: Cardiomediastinal silhouette is accentuated by low lung volumes and AP portable technique. Low lung volumes with bilateral peripheral predominant airspace opacities. No visible pleural effusions or pneumothorax. No acute osseous abnormality. IMPRESSION: Low lung volumes with bilateral peripheral predominant airspace opacities, concerning for multifocal pneumonia. Recommend correlation with COVID testing. Electronically Signed   By: Margaretha Sheffield MD   On: 08/19/2020 11:59   DG Chest Port 1V same Day  Result Date:  09/01/2020 CLINICAL DATA:  Respiratory failure. Shortness of breath. History of COVID. EXAM: PORTABLE CHEST 1 VIEW COMPARISON:  08/08/2020. FINDINGS: Cardiomegaly.  Diffuse bilateral pulmonary infiltrates/edema again noted. Low lung volumes. No pleural effusion or pneumothorax. No acute bony abnormality. IMPRESSION: Cardiomegaly. Diffuse bilateral pulmonary infiltrates/edema again noted. Congestive heart failure and or bilateral pneumonia could present in this fashion. Low lung volumes. Electronically Signed   By: Marcello Moores  Register   On: 09/01/2020 09:29   DG HIP PORT UNILAT WITH PELVIS 1V RIGHT  Result Date: 09/13/2020 CLINICAL DATA:  Acute right hip pain without known injury. EXAM: DG HIP (WITH OR WITHOUT PELVIS) 1V PORT RIGHT COMPARISON:  None. FINDINGS: There is no evidence of hip fracture or dislocation. Mild osteophyte formation is seen involving the right hip without significant joint space narrowing. IMPRESSION: Mild degenerative joint disease of the right hip. No acute abnormality seen. Electronically Signed   By: Marijo Conception M.D.   On: 09/13/2020 10:05   VAS [place] LOWER EXTREMITY VENOUS (DVT)  Result Date: 09/05/2020  Lower Venous DVT Study Indications: Elevated d-dimer, Covid.  Comparison Study: No prior studies. Performing Technologist: Darlin Coco, RDMS  Examination Guidelines: A complete evaluation includes B-mode imaging, spectral Doppler, color Doppler, and power Doppler as needed of all accessible portions of each vessel. Bilateral testing is considered an integral part of a complete examination. Limited examinations for reoccurring indications may be performed as noted. The reflux portion of the exam is performed with the patient in reverse Trendelenburg.  +---------+---------------+---------+-----------+----------+--------------+ RIGHT    CompressibilityPhasicitySpontaneityPropertiesThrombus Aging +---------+---------------+---------+-----------+----------+--------------+ CFV       Full           Yes      Yes                                 +---------+---------------+---------+-----------+----------+--------------+ SFJ      Full                                                        +---------+---------------+---------+-----------+----------+--------------+ FV Prox  Full                                                        +---------+---------------+---------+-----------+----------+--------------+ FV Mid   Full                                                        +---------+---------------+---------+-----------+----------+--------------+ FV DistalFull                                                        +---------+---------------+---------+-----------+----------+--------------+ PFV      Full                                                        +---------+---------------+---------+-----------+----------+--------------+  POP      Full           Yes      Yes                                 +---------+---------------+---------+-----------+----------+--------------+ PTV      Full                                                        +---------+---------------+---------+-----------+----------+--------------+ PERO     Full                                                        +---------+---------------+---------+-----------+----------+--------------+   +---------+---------------+---------+-----------+----------+--------------+ LEFT     CompressibilityPhasicitySpontaneityPropertiesThrombus Aging +---------+---------------+---------+-----------+----------+--------------+ CFV      Full           Yes      Yes                                 +---------+---------------+---------+-----------+----------+--------------+ SFJ      Full                                                        +---------+---------------+---------+-----------+----------+--------------+ FV Prox  Full                                                         +---------+---------------+---------+-----------+----------+--------------+ FV Mid   Full                                                        +---------+---------------+---------+-----------+----------+--------------+ FV DistalFull                                                        +---------+---------------+---------+-----------+----------+--------------+ PFV      Full                                                        +---------+---------------+---------+-----------+----------+--------------+ POP      Full           Yes      Yes                                 +---------+---------------+---------+-----------+----------+--------------+  PTV      Full                                                        +---------+---------------+---------+-----------+----------+--------------+ PERO     Full                                                        +---------+---------------+---------+-----------+----------+--------------+     Summary: RIGHT: - There is no evidence of deep vein thrombosis in the lower extremity.  - No cystic structure found in the popliteal fossa.  LEFT: - There is no evidence of deep vein thrombosis in the lower extremity.  - No cystic structure found in the popliteal fossa.  *See table(s) above for measurements and observations. Electronically signed by Harold Barban MD on 09/05/2020 at 5:31:25 PM.    Final    ECHOCARDIOGRAM LIMITED  Result Date: 08/31/2020    ECHOCARDIOGRAM LIMITED REPORT   Patient Name:   AMIEL MCCAFFREY Date of Exam: 08/31/2020 Medical Rec #:  [id number]       Height:       73.0 in Accession #:    [id number]      Weight:       364.2 lb Date of Birth:  December 13, 1955       BSA:          2.776 m Patient Age:    21 years        BP:           142/86 mmHg Patient Gender: M               HR:           101 bpm. Exam Location:  Inpatient Procedure: Limited Echo, Cardiac Doppler, Color Doppler and Intracardiac             Opacification Agent Indications:    Chest Pain 786.50/ R07.9  History:        Patient has no prior history of Echocardiogram examinations.                 Risk Factors:Hypertension, Diabetes and Non-Smoker. GERD.  Sonographer:    Vickie Epley RDCS Referring Phys: [id number] [facility] CHAND  Sonographer Comments: Covid positive. IMPRESSIONS  1. Left ventricular ejection fraction, by estimation, is 65 to 70%. The left ventricle has normal function. The left ventricle has no regional wall motion abnormalities. There is mild left ventricular hypertrophy. Left ventricular diastolic parameters are consistent with Grade I diastolic dysfunction (impaired relaxation).  2. Right ventricule is not well visualized but grossly normal size and systolic function. Tricuspid regurgitation signal is inadequate for assessing PA pressure.  3. The mitral valve is normal in structure. No evidence of mitral valve regurgitation.  4. The aortic valve is tricuspid. Aortic valve regurgitation is not visualized. No aortic stenosis is present.  5. Aortic dilatation noted. There is mild dilatation of the ascending aorta, measuring 39 mm. FINDINGS  Left Ventricle: Left ventricular ejection fraction, by estimation, is 65 to 70%. The left ventricle has normal function. The left ventricle has no regional wall motion abnormalities. Definity contrast agent was given IV to delineate the  left ventricular  endocardial borders. The left ventricular internal cavity size was normal in size. There is mild left ventricular hypertrophy. Left ventricular diastolic parameters are consistent with Grade I diastolic dysfunction (impaired relaxation). Right Ventricle: The right ventricular size is not well visualized. Right ventricular systolic function was not well visualized. Tricuspid regurgitation signal is inadequate for assessing PA pressure. Left Atrium: Left atrial size was normal in size. Pericardium: There is no evidence of pericardial effusion. Mitral Valve:  The mitral valve is normal in structure. Tricuspid Valve: The tricuspid valve is normal in structure. Tricuspid valve regurgitation is not demonstrated. Aortic Valve: The aortic valve is tricuspid. Aortic valve regurgitation is not visualized. No aortic stenosis is present. Pulmonic Valve: The pulmonic valve was not well visualized. Pulmonic valve regurgitation is not visualized. Aorta: The aortic root is normal in size and structure and aortic dilatation noted. There is mild dilatation of the ascending aorta, measuring 39 mm. IAS/Shunts: The interatrial septum was not well visualized. LEFT VENTRICLE PLAX 2D LVIDd:         4.60 cm      Diastology LVIDs:         3.40 cm      LV e' medial:    6.42 cm/s LV PW:         1.10 cm      LV E/e' medial:  11.2 LV IVS:        1.10 cm      LV e' lateral:   9.25 cm/s LVOT diam:     2.60 cm      LV E/e' lateral: 7.7 LV SV:         106 LV SV Index:   38 LVOT Area:     5.31 cm  LV Volumes (MOD) LV vol d, MOD A2C: 91.9 ml LV vol d, MOD A4C: 116.0 ml LV vol s, MOD A2C: 21.5 ml LV vol s, MOD A4C: 37.8 ml LV SV MOD A2C:     70.4 ml LV SV MOD A4C:     116.0 ml LV SV MOD BP:      72.2 ml LEFT ATRIUM           Index LA diam:      4.10 cm 1.48 cm/m LA Vol (A4C): 46.1 ml 16.61 ml/m  AORTIC VALVE LVOT Vmax:   108.00 cm/s LVOT Vmean:  70.200 cm/s LVOT VTI:    0.199 m  AORTA Ao Root diam: 3.80 cm Ao Asc diam:  3.90 cm MITRAL VALVE MV Area (PHT): 5.62 cm    SHUNTS MV Decel Time: 135 msec    Systemic VTI:  0.20 m MV E velocity: 71.60 cm/s  Systemic Diam: 2.60 cm MV A velocity: 90.00 cm/s MV E/A ratio:  0.80 Oswaldo Milian MD Electronically signed by Oswaldo Milian MD Signature Date/Time: 08/31/2020/12:03:23 PM    Final

## 2020-09-13 NOTE — Procedures (Signed)
Arterial Catheter Insertion Procedure Note  Robert [name]  [id number]  12/31/1955  Date:09/13/20  Time:8:44 PM    Provider Performing: Governor Specking C    Procedure: Insertion of Arterial Line [phone]) without US guidance  Indication(s) Blood pressure monitoring and/or need for frequent ABGs  Consent Unable to obtain consent due to emergent nature of procedure.  Anesthesia None   Time Out Verified patient identification, verified procedure, site/side was marked, verified correct patient position, special equipment/implants available, medications/allergies/relevant history reviewed, required imaging and test results available.   Sterile Technique Maximal sterile technique including full sterile barrier drape, hand hygiene, sterile gown, sterile gloves, mask, hair covering, sterile ultrasound probe cover (if used).   Procedure Description Area of catheter insertion was cleaned with chlorhexidine and draped in sterile fashion. With real-time ultrasound guidance an arterial catheter was placed into the left radial artery.  Appropriate arterial tracings confirmed on monitor.     Complications/Tolerance None; patient tolerated the procedure well.   EBL Minimal   Specimen(s) None

## 2020-09-13 NOTE — Progress Notes (Signed)
[facility] Progress Note Patient Name: CORDNEY BARSTOW DOB: Nov 28, 1955 MRN: [id number]   Date of Service  09/13/2020  HPI/Events of Note  Hypotension - BP = 77/55 with MAP = 62. Patient on Norepinephrine IV infusion at ceiling dose, however, no order for same.   eICU Interventions  Plan: 1. Order Norepinephrine IV infusion and increase ceiling to 60 mcg/min.  2. Add Vasopressin IV infusion at shock dose.  3. Please obtain post intubation ABG now.      Intervention Category Major Interventions: Respiratory failure - evaluation and management;Hypotension - evaluation and management  Lysle Dingwall 09/13/2020, 7:49 PM

## 2020-09-13 NOTE — Procedures (Signed)
Central Venous Catheter Insertion Procedure Note  Robert [name]  [id number]  [birth year]  Date:09/13/20  Time:6:52 PM   Provider Performing:Marrah Vanevery V. [name]   Procedure: Insertion of Non-tunneled Central Venous [phone]) with US guidance (12162)   Indication(s) Difficult access  Consent Risks of the procedure as well as the alternatives and risks of each were explained to the patient and/or caregiver.  Consent for the procedure was obtained and is signed in the bedside chart  Anesthesia Topical only with 1% lidocaine   Timeout Verified patient identification, verified procedure, site/side was marked, verified correct patient position, special equipment/implants available, medications/allergies/relevant history reviewed, required imaging and test results available.  Sterile Technique Maximal sterile technique including full sterile barrier drape, hand hygiene, sterile gown, sterile gloves, mask, hair covering, sterile ultrasound probe cover (if used).  Procedure Description Area of catheter insertion was cleaned with chlorhexidine and draped in sterile fashion.  With real-time ultrasound guidance a central venous catheter was placed into the right femoral vein. Nonpulsatile blood flow and easy flushing noted in all ports.  The catheter was sutured in place and sterile dressing applied.  Complications/Tolerance None; patient tolerated the procedure well. Chest X-ray is ordered to verify placement for internal jugular or subclavian cannulation.   Chest x-ray is not ordered for femoral cannulation.  EBL Minimal  Specimen(s) None  Remmy Crass V. [name] MD

## 2020-09-13 NOTE — Progress Notes (Addendum)
[facility] Progress Note Patient Name: Robert [name] DOB: 22-Nov-1955 MRN: [id number]   Date of Service  09/13/2020  HPI/Events of Note  ABG on 100%/PRVC 35/TV 490/P 12 = 7.178/42.9/101/16.0 with Sat = 96%  eICU Interventions  Plan: 1. NaHCO3 100 meq IV now.  2. Increase NaHCO3 IV infusion to 125 mL/hour.  3. Repeat ABG at 2 AM.     Intervention Category Major Interventions: Respiratory failure - evaluation and management;Acid-Base disturbance - evaluation and management  Robert [name] 09/13/2020, 11:31 PM

## 2020-09-13 NOTE — Progress Notes (Signed)
eLink Physician-Brief Progress Note Patient Name: Robert [name] DOB: [birth year] MRN: [id number]   Date of Service  09/13/2020  HPI/Events of Note  Hypotension - BP = 83/52 on Norepinephrine IV infusion at 60 mcg/min and Vasopressin IV infusion at shock dose.   eICU Interventions  Plan: 1. Bolus with 0.9 NaCl 1 liter IV over 1 hour now.  2. Await ABG. 3. Will ask ground team to evaluate the patient at bedside.      Intervention Category Major Interventions: Hypotension - evaluation and management  Dyane Broberg Eugene 09/13/2020, 8:40 PM

## 2020-09-13 NOTE — Progress Notes (Signed)
Patient transferred to ICU 8 spoke to [facility] and nurse taking patient was at lunch, Helene Kelp stated we could go ahead and bring patient and give bedside report, Friona charge nurse transporting patient to ICU 8 and will give report at bedside.

## 2020-09-13 NOTE — Progress Notes (Signed)
NAME:  Robert [name], MRN:  [id number], DOB:  04/08/1956, LOS: 40 ADMISSION DATE:  08/31/2020, CONSULTATION DATE: 11/28 REFERRING MD:  Dr. Darrick Meigs, CHIEF COMPLAINT:  SOB  Brief History   64 y/o vaccinated male, who presented to [facility] on 11/28 with a 1 week hx of of shortness of breath (recently attended a funeral). Found to be COVID positive with RA sats of 67%. Placed on 50L HFNC + NRB. Tx to [facility] for evaluation.  Transitioned out of ICU on 12/2 & was weaned to 7-10 L [place]. Completed course of remdesivir & baricitinib, was on full dose lovenox until 12/10, steroids being tapered Developed acute worsening of his hypoxia 12/12 requiring 50L HHFNC + NRB , transferred back to ICU   Past Medical History  HTN GERD  DM II  BMI 54.64 BPH  Significant Hospital Events   11/28 Admit from [facility] with COVID  12/01 Doing well on HFNC and NRB. Weaned to FIO2 90% and 35L/min.  12/02 O2 needs improved to 30L flow / 70% 12/12 O2 needs suddenly worse  Consults:  Diabetes Coordinator   Procedures:    Significant Diagnostic Tests:  CXR 11/28 >> bilateral airspace disease 11/29>> Echo: EF 23-55%, grade 1 diastolic dysfunction.  Micro Data:  COVID 11/28 >> positive  Influenza 11/28 >> negative  BCx2 11/28 >> ng MRSA PCR 11/28 >> negative   Antimicrobials:  Azithromycin 11/28  Ceftriaxone 11/28 Baricitinib 11/28 >>12/10 Remdesivir 11/28 >>12/2  Interim history/subjective:     Objective   Blood pressure 92/74, pulse (!) 133, temperature 97.9 F (36.6 C), temperature source Oral, resp. rate 19, height 6\' 1"  (1.854 m), weight (!) 165.2 kg, SpO2 91 %.        Intake/Output Summary (Last 24 hours) at 09/13/2020 1634 Last data filed at 09/13/2020 1442 Gross per 24 hour  Intake 0 ml  Output 450 ml  Net -450 ml   Filed Weights   09/11/20 0441 09/12/20 0500 09/13/20 0406  Weight: (!) 169.2 kg (!) 169.2 kg (!) 165.2 kg   Physical Exam:  ' Gen. Pleasant, obese, in no distress, normal  affect ENT - no pallor,icterus, no post nasal drip, class 2-3 airway Neck: No JVD, no thyromegaly, no carotid bruits Lungs: no use of accessory muscles, no dullness to percussion, decreased without rales or rhonchi  Cardiovascular: Rhythm regular, heart sounds  normal, no murmurs or gallops, no peripheral edema Abdomen: soft and non-tender, no hepatosplenomegaly, BS normal. Musculoskeletal: No deformities, no cyanosis or clubbing Neuro: Awake, follows commands, nonfocal    CXR 12/12 independently reviewed >>patchy BL ASD, no change in aeration from earlier film  Resolved Hospital Problem list     Assessment & Plan:   Central line was emergently placed, towards end of procedure patient was unresponsive blood pressure in the 60s, emergently intubated, given fluids and Levophed started 40 mics, given 1 amp of epinephrine with 1 amp of bicarb   ARDS from COVID-19 pneumonia - unclear cause of worsening - will increase steroids back up to solumedrol 40 q 8h - Start IV heparin , lovenox was decreased to proph doses on 12/10 , doubt PE, RV not well seen on previous echo -Lung protective ventilation Check ABG chest x-ray With tidal volume 500 equal 7 cc/kg, respirate of 26, peak pressure was 30, with PEEP of 12 -If P/F ratio less than 150 consider proning    Shock, septic versus obstructive -If decompensates consider TPA   AKI on CKD Stage IIIb -Avoid nephrotoxins  Diabetes  Type 2 with Hyperglycemia due to Steroids -Start insulin drip  Hypertension - hold metoprolol and other antihypertensives  BPH  -tamsulosin   Best practice (evaluated daily)  Diet: Yes Pain/Anxiety/Delirium protocol (if indicated): N/A VAP protocol (if indicated): N/A DVT prophylaxis: IV heparin GI prophylaxis: Famotidine Glucose control: SSI Mobility: As tolerated Code Status: Full code Family Communication:  patient at bedside , wife Disposition: ICU  Labs   CBC: Recent Labs  Lab  09/07/20 1111 09/08/20 0122 09/09/20 0223 09/12/20 0317 09/13/20 0500  WBC 18.3* 17.5* 15.3* 14.4* 15.5*  NEUTROABS 16.2* 14.1* 12.5* 9.3* 11.2*  HGB 14.4 13.6 13.3 13.9 13.4  HCT 43.7 41.4 41.0 40.9 39.9  MCV 86.0 85.0 85.8 84.9 86.0  PLT 465* 478* 431* 321 597    Basic Metabolic Panel: Recent Labs  Lab 09/07/20 1111 09/08/20 0122 09/09/20 0223 09/12/20 0317 09/13/20 0500  NA 131* 136 134* 135 136  K 4.3 4.2 4.6 5.1 4.5  CL 98 100 99 99 99  CO2 23 26 26 26 27   GLUCOSE 200* 64* 103* 136* 148*  BUN 28* 31* 29* 28* 22  CREATININE 1.60* 1.74* 1.65* 1.65* 1.54*  CALCIUM 8.3* 8.7* 8.6* 8.8* 8.8*  MG 2.5* 2.6* 2.5* QUANTITY NOT SUFFICIENT, UNABLE TO PERFORM TEST 2.3   GFR: Estimated Creatinine Clearance: 80.2 mL/min (A) (by C-G formula based on SCr of 1.54 mg/dL (H)). Recent Labs  Lab 09/08/20 0122 09/09/20 0223 09/12/20 0317 09/13/20 0500  WBC 17.5* 15.3* 14.4* 15.5*    Liver Function Tests: Recent Labs  Lab 09/07/20 1111 09/08/20 0122 09/09/20 0223 09/12/20 0317 09/13/20 0500  AST 25 25 22 24 21   ALT 24 26 27 23 30   ALKPHOS 60 61 59 54 55  BILITOT 0.8 0.4 0.6 QUANTITY NOT SUFFICIENT, UNABLE TO PERFORM TEST 0.7  PROT 6.7 6.7 6.4* 6.3* 6.6  ALBUMIN 2.4* 2.3* 2.2* 2.5* 2.6*   No results for input(s): LIPASE, AMYLASE in the last 168 hours. No results for input(s): AMMONIA in the last 168 hours.  ABG    Component Value Date/Time   PHART 7.444 08/22/2020 1105   PCO2ART 31.9 (L) 08/29/2020 1105   PO2ART 55.2 (L) 08/04/2020 1105   HCO3 23.1 08/24/2020 1105   ACIDBASEDEF 2.0 08/07/2020 1105   O2SAT 86.0 08/20/2020 1105     Coagulation Profile: No results for input(s): INR, PROTIME in the last 168 hours.  Cardiac Enzymes: No results for input(s): CKTOTAL, CKMB, CKMBINDEX, TROPONINI in the last 168 hours.  HbA1C: Hemoglobin A1C  Date/Time Value Ref Range Status  12/04/2018 12:00 AM 6.9  Final   Hgb A1c MFr Bld  Date/Time Value Ref Range Status   08/28/2020 08:32 PM 7.9 (H) 4.8 - 5.6 % Final    Comment:    (NOTE) Pre diabetes:          5.7%-6.4%  Diabetes:              >6.4%  Glycemic control for   <7.0% adults with diabetes   02/03/2020 07:49 AM 6.5 (H) <5.7 % of total Hgb Final    Comment:    For someone without known diabetes, a hemoglobin A1c value of 6.5% or greater indicates that they may have  diabetes and this should be confirmed with a follow-up  test. . For someone with known diabetes, a value <7% indicates  that their diabetes is well controlled and a value  greater than or equal to 7% indicates suboptimal  control. A1c targets should be individualized based  on  duration of diabetes, age, comorbid conditions, and  other considerations. . Currently, no consensus exists regarding use of hemoglobin A1c for diagnosis of diabetes for children. .     CBG: Recent Labs  Lab 09/12/20 1744 09/12/20 2122 09/13/20 0749 09/13/20 1152 09/13/20 1531  GLUCAP 243* 199* 150* 267* 274*     Total critical care time: 53m   The treatment and management of the patient's condition was required based on the threat of imminent deterioration. This time reflects time spent by the physician evaluating, providing care and managing the critically ill patient's care. The time was spent at the immediate bedside (or on the same floor/unit and dedicated to this patient's care). Time involved in separately billable procedures is NOT included int he critical care time indicated above. Family meeting and update time may be included above if and only if the patient is unable/incompetent to participate in clinical interview and/or decision making, and the discussion was necessary to determining treatment decisions   Kara Mead MD. Shade Flood. [facility] See Amion for pager  If no response to pager , please call [phone]  After 7:00 pm call Elink  [phone]    09/13/2020, 4:34 PM

## 2020-09-13 NOTE — Progress Notes (Signed)
Returned from off the floor to patient had just called out c/o he could not breath, when tech went into room patient was not responding O2 sat 92% on 7L nasal cannula and non rebreather, I entered the room to patient being gray ashened color, no responding, rapid response called, rapid response team arrived, patients oxygen had already been turned to 15L via nasal cannula and 15L non rebreather, O2 sat 95%, v/s obtained, heart rate elevated to 150's to 160's, Dr Candiss Norse notified and came to patients room, orders received and executed, patient did vomit during this period and had oral suction, blood glucose obtained and was 274, orders to move patient to unit and patient was placed on heated high flow at 50% with non rebreather, waiting on a bed assignment to call report, rapid response team still with patient. Patient is alert oriented x4 at this time.

## 2020-09-13 NOTE — Progress Notes (Signed)
Patients wife was notified of patients decline and being moved to the unit, she stated the Dr already notified her. And thanked [place]

## 2020-09-13 NOTE — Progress Notes (Signed)
ABG collected and critical values called in to [facility].

## 2020-09-13 NOTE — Procedures (Signed)
Intubation Procedure Note  Robert [name]  [id number]  1956/01/25  Date:09/13/20  Time:6:51 PM   Provider Performing:Abed Schar V. Xana Bradt    Procedure: Intubation (40375)  Indication(s) Respiratory Failure  Consent Unable to obtain consent due to emergent nature of procedure.   Anesthesia Etomidate, Versed, Fentanyl and Rocuronium   Time Out Verified patient identification, verified procedure, site/side was marked, verified correct patient position, special equipment/implants available, medications/allergies/relevant history reviewed, required imaging and test results available.   Sterile Technique Usual hand hygeine, masks, and gloves were used   Procedure Description Patient positioned in bed supine.  Sedation given as noted above.  Patient was intubated with endotracheal tube using Glidescope.  View was Grade 2 only posterior commissure .  Number of attempts was 1.  Colorimetric CO2 detector was consistent with tracheal placement.   Complications/Tolerance None; patient tolerated the procedure well. Chest X-ray is ordered to verify placement.   EBL Minimal   Specimen(s) None  Robert [name] V. Robert Soho MD

## 2020-09-13 NOTE — Progress Notes (Signed)
Updated wife to sequence of events and worsening and intubation. Discussed possibilities including PE. Bedside echo attempted but poor windows Given extremis and sudden worsening hypoxia, will go ahead and give IV TPA empirically 100 mg.  Care discussed risks and benefits in detail with wife including small chance of brain hemorrhage  Additional critical care time 25 minutes  Safiya Girdler V. [name] MD

## 2020-09-14 ENCOUNTER — Inpatient Hospital Stay (HOSPITAL_COMMUNITY): Payer: 59

## 2020-09-14 DIAGNOSIS — R578 Other shock: Secondary | ICD-10-CM

## 2020-09-14 LAB — POCT I-STAT 7, (LYTES, BLD GAS, ICA,H+H)
Acid-base deficit: 15 mmol/L — ABNORMAL HIGH (ref 0.0–2.0)
Acid-base deficit: 10 mmol/L — ABNORMAL HIGH (ref 0.0–2.0)
Bicarbonate: 14.9 mmol/L — ABNORMAL LOW (ref 20.0–28.0)
Bicarbonate: 19.1 mmol/L — ABNORMAL LOW (ref 20.0–28.0)
Calcium, Ion: 1 mmol/L — ABNORMAL LOW (ref 1.15–1.40)
Calcium, Ion: 0.92 mmol/L — ABNORMAL LOW (ref 1.15–1.40)
HCT: 26 % — ABNORMAL LOW (ref 39.0–52.0)
HCT: 23 % — ABNORMAL LOW (ref 39.0–52.0)
Hemoglobin: 8.8 g/dL — ABNORMAL LOW (ref 13.0–17.0)
Hemoglobin: 7.8 g/dL — ABNORMAL LOW (ref 13.0–17.0)
O2 Saturation: 92 %
O2 Saturation: 91 %
Patient temperature: 97.6
Patient temperature: 97.6
Potassium: 5.2 mmol/L — ABNORMAL HIGH (ref 3.5–5.1)
Potassium: 4.6 mmol/L (ref 3.5–5.1)
Sodium: 140 mmol/L (ref 135–145)
Sodium: 145 mmol/L (ref 135–145)
TCO2: 17 mmol/L — ABNORMAL LOW (ref 22–32)
TCO2: 21 mmol/L — ABNORMAL LOW (ref 22–32)
pCO2 arterial: 53.5 mmHg — ABNORMAL HIGH (ref 32.0–48.0)
pCO2 arterial: 56.8 mmHg — ABNORMAL HIGH (ref 32.0–48.0)
pH, Arterial: 7.049 — CL (ref 7.350–7.450)
pH, Arterial: 7.132 — CL (ref 7.350–7.450)
pO2, Arterial: 87 mmHg (ref 83.0–108.0)
pO2, Arterial: 78 mmHg — ABNORMAL LOW (ref 83.0–108.0)

## 2020-09-14 LAB — CBC WITH DIFFERENTIAL/PLATELET
Abs Immature Granulocytes: 1.94 10*3/uL — ABNORMAL HIGH (ref 0.00–0.07)
Basophils Absolute: 0.1 10*3/uL (ref 0.0–0.1)
Basophils Relative: 0 %
Eosinophils Absolute: 0 10*3/uL (ref 0.0–0.5)
Eosinophils Relative: 0 %
HCT: 27.3 % — ABNORMAL LOW (ref 39.0–52.0)
Hemoglobin: 8.4 g/dL — ABNORMAL LOW (ref 13.0–17.0)
Immature Granulocytes: 4 %
Lymphocytes Relative: 6 %
Lymphs Abs: 2.9 10*3/uL (ref 0.7–4.0)
MCH: 28.6 pg (ref 26.0–34.0)
MCHC: 30.8 g/dL (ref 30.0–36.0)
MCV: 92.9 fL (ref 80.0–100.0)
Monocytes Absolute: 3.8 10*3/uL — ABNORMAL HIGH (ref 0.1–1.0)
Monocytes Relative: 7 %
Neutro Abs: 42 10*3/uL — ABNORMAL HIGH (ref 1.7–7.7)
Neutrophils Relative %: 83 %
Platelets: 366 10*3/uL (ref 150–400)
RBC: 2.94 MIL/uL — ABNORMAL LOW (ref 4.22–5.81)
RDW: 13.1 % (ref 11.5–15.5)
WBC: 50.6 10*3/uL (ref 4.0–10.5)
nRBC: 0 % (ref 0.0–0.2)

## 2020-09-14 LAB — PROTIME-INR
INR: 2.3 — ABNORMAL HIGH (ref 0.8–1.2)
Prothrombin Time: 24.3 seconds — ABNORMAL HIGH (ref 11.4–15.2)

## 2020-09-14 LAB — COMPREHENSIVE METABOLIC PANEL
ALT: 1277 U/L — ABNORMAL HIGH (ref 0–44)
AST: 933 U/L — ABNORMAL HIGH (ref 15–41)
Albumin: 1.7 g/dL — ABNORMAL LOW (ref 3.5–5.0)
Alkaline Phosphatase: 53 U/L (ref 38–126)
Anion gap: 33 — ABNORMAL HIGH (ref 5–15)
BUN: 36 mg/dL — ABNORMAL HIGH (ref 8–23)
CO2: 16 mmol/L — ABNORMAL LOW (ref 22–32)
Calcium: 7.6 mg/dL — ABNORMAL LOW (ref 8.9–10.3)
Chloride: 98 mmol/L (ref 98–111)
Creatinine, Ser: 3.79 mg/dL — ABNORMAL HIGH (ref 0.61–1.24)
GFR, Estimated: 17 mL/min — ABNORMAL LOW (ref >60)
Glucose, Bld: 245 mg/dL — ABNORMAL HIGH (ref 70–99)
Potassium: 5.2 mmol/L — ABNORMAL HIGH (ref 3.5–5.1)
Sodium: 147 mmol/L — ABNORMAL HIGH (ref 135–145)
Total Bilirubin: 0.9 mg/dL (ref 0.3–1.2)
Total Protein: 4.2 g/dL — ABNORMAL LOW (ref 6.5–8.1)

## 2020-09-14 LAB — CBC
HCT: 29.4 % — ABNORMAL LOW (ref 39.0–52.0)
Hemoglobin: 9.4 g/dL — ABNORMAL LOW (ref 13.0–17.0)
MCH: 29.1 pg (ref 26.0–34.0)
MCHC: 32 g/dL (ref 30.0–36.0)
MCV: 91 fL (ref 80.0–100.0)
Platelets: 406 10*3/uL — ABNORMAL HIGH (ref 150–400)
RBC: 3.23 MIL/uL — ABNORMAL LOW (ref 4.22–5.81)
RDW: 12.7 % (ref 11.5–15.5)
WBC: 46.8 10*3/uL — ABNORMAL HIGH (ref 4.0–10.5)
nRBC: 0 % (ref 0.0–0.2)

## 2020-09-14 LAB — TROPONIN I (HIGH SENSITIVITY): Troponin I (High Sensitivity): 391 ng/L (ref <18)

## 2020-09-14 LAB — PATHOLOGIST SMEAR REVIEW

## 2020-09-14 LAB — GLUCOSE, CAPILLARY
Glucose-Capillary: 230 mg/dL — ABNORMAL HIGH (ref 70–99)
Glucose-Capillary: 165 mg/dL — ABNORMAL HIGH (ref 70–99)
Glucose-Capillary: 205 mg/dL — ABNORMAL HIGH (ref 70–99)
Glucose-Capillary: 176 mg/dL — ABNORMAL HIGH (ref 70–99)
Glucose-Capillary: 157 mg/dL — ABNORMAL HIGH (ref 70–99)
Glucose-Capillary: 152 mg/dL — ABNORMAL HIGH (ref 70–99)
Glucose-Capillary: 120 mg/dL — ABNORMAL HIGH (ref 70–99)
Glucose-Capillary: 108 mg/dL — ABNORMAL HIGH (ref 70–99)

## 2020-09-14 LAB — APTT
aPTT: 124 seconds — ABNORMAL HIGH (ref 24–36)
aPTT: 67 seconds — ABNORMAL HIGH (ref 24–36)
aPTT: >200 seconds (ref 24–36)

## 2020-09-14 LAB — HEPARIN LEVEL (UNFRACTIONATED): Heparin Unfractionated: 0.87 IU/mL — ABNORMAL HIGH (ref 0.30–0.70)

## 2020-09-14 LAB — BRAIN NATRIURETIC PEPTIDE: B Natriuretic Peptide: 87.5 pg/mL (ref 0.0–100.0)

## 2020-09-14 LAB — MAGNESIUM: Magnesium: 3.2 mg/dL — ABNORMAL HIGH (ref 1.7–2.4)

## 2020-09-14 LAB — LACTIC ACID, PLASMA: Lactic Acid, Venous: >11 mmol/L (ref 0.5–1.9)

## 2020-09-14 LAB — D-DIMER, QUANTITATIVE: D-Dimer, Quant: >20 ug/mL-FEU — ABNORMAL HIGH (ref 0.00–0.50)

## 2020-09-14 LAB — PROCALCITONIN: Procalcitonin: 0.28 ng/mL

## 2020-09-14 MED ORDER — SODIUM BICARBONATE 8.4 % IV SOLN
100.0000 meq | Freq: Once | INTRAVENOUS | Status: AC
  Administered 2020-09-14: 06:00:00 100 meq via INTRAVENOUS
  Filled 2020-09-14: qty 100

## 2020-09-14 MED ORDER — FAMOTIDINE 20 MG PO TABS
20.0000 mg | ORAL_TABLET | Freq: Every day | ORAL | Status: DC
  Administered 2020-09-14: 09:00:00 20 mg
  Filled 2020-09-14: qty 1

## 2020-09-14 MED ORDER — CHLORHEXIDINE GLUCONATE 0.12% ORAL RINSE (MEDLINE KIT)
15.0000 mL | Freq: Two times a day (BID) | OROMUCOSAL | Status: DC
  Administered 2020-09-14: 10:00:00 15 mL via OROMUCOSAL

## 2020-09-14 MED ORDER — INSULIN REGULAR(HUMAN) IN NACL 100-0.9 UT/100ML-% IV SOLN
INTRAVENOUS | Status: DC
  Administered 2020-09-14: 06:00:00 4.2 [IU]/h via INTRAVENOUS
  Filled 2020-09-14: qty 100

## 2020-09-14 MED ORDER — HEPARIN (PORCINE) 25000 UT/250ML-% IV SOLN
1500.0000 [IU]/h | INTRAVENOUS | Status: DC
  Administered 2020-09-14: 01:00:00 1500 [IU]/h via INTRAVENOUS
  Filled 2020-09-14: qty 250

## 2020-09-14 MED ORDER — ACETAMINOPHEN 325 MG PO TABS: 650.0000 mg | ORAL_TABLET | Freq: Four times a day (QID) | ORAL | Status: DC | PRN

## 2020-09-14 MED ORDER — ASCORBIC ACID 500 MG PO TABS
500.0000 mg | ORAL_TABLET | Freq: Every day | ORAL | Status: DC
  Administered 2020-09-14: 09:00:00 500 mg
  Filled 2020-09-14: qty 1

## 2020-09-14 MED ORDER — SODIUM BICARBONATE 8.4 % IV SOLN
INTRAVENOUS | Status: AC
  Administered 2020-09-14: 07:00:00 50 meq
  Filled 2020-09-14: qty 100

## 2020-09-14 MED ORDER — ORAL CARE MOUTH RINSE
15.0000 mL | OROMUCOSAL | Status: DC
  Administered 2020-09-14: 11:00:00 15 mL via OROMUCOSAL

## 2020-09-14 MED ORDER — DOCUSATE SODIUM 50 MG/5ML PO LIQD: 100.0000 mg | Freq: Every day | ORAL | Status: DC | PRN

## 2020-09-14 MED ORDER — SODIUM BICARBONATE 8.4 % IV SOLN
INTRAVENOUS | Status: AC
  Filled 2020-09-14: qty 50

## 2020-09-14 MED ORDER — HEPARIN (PORCINE) 25000 UT/250ML-% IV SOLN: 1100.0000 [IU]/h | INTRAVENOUS | Status: DC

## 2020-09-14 MED ORDER — VANCOMYCIN VARIABLE DOSE PER UNSTABLE RENAL FUNCTION (PHARMACIST DOSING): Status: DC

## 2020-09-14 MED ORDER — VANCOMYCIN HCL 10 G IV SOLR
2500.0000 mg | Freq: Once | INTRAVENOUS | Status: AC
  Administered 2020-09-14: 06:00:00 2500 mg via INTRAVENOUS
  Filled 2020-09-14: qty 2500

## 2020-09-14 MED ORDER — SODIUM CHLORIDE 0.9 % IV SOLN
1.0000 g | Freq: Two times a day (BID) | INTRAVENOUS | Status: DC
  Filled 2020-09-14: qty 1

## 2020-09-14 MED ORDER — ATROPINE SULFATE 1 MG/10ML IJ SOSY
1.0000 mg | PREFILLED_SYRINGE | Freq: Once | INTRAMUSCULAR | Status: AC
  Administered 2020-09-14: 12:00:00 1 mg via INTRAVENOUS

## 2020-09-14 MED ORDER — SODIUM CHLORIDE 0.9 % IV SOLN
2.0000 g | Freq: Three times a day (TID) | INTRAVENOUS | Status: DC
  Administered 2020-09-14: 05:00:00 2 g via INTRAVENOUS
  Filled 2020-09-14 (×3): qty 2

## 2020-09-14 MED ORDER — SODIUM BICARBONATE 8.4 % IV SOLN
200.0000 meq | Freq: Once | INTRAVENOUS | Status: AC
  Administered 2020-09-14: 03:00:00 200 meq via INTRAVENOUS
  Filled 2020-09-14: qty 100

## 2020-09-14 MED ORDER — POLYETHYLENE GLYCOL 3350 17 G PO PACK: 17.0000 g | PACK | Freq: Every day | ORAL | Status: DC | PRN

## 2020-09-14 MED ORDER — ROSUVASTATIN CALCIUM 20 MG PO TABS: 40.0000 mg | ORAL_TABLET | Freq: Every day | ORAL | Status: DC

## 2020-09-14 MED ORDER — VANCOMYCIN HCL 1250 MG/250ML IV SOLN: 1250.0000 mg | Freq: Two times a day (BID) | INTRAVENOUS | Status: DC

## 2020-09-14 MED ORDER — SODIUM BICARBONATE 8.4 % IV SOLN
200.0000 meq | Freq: Once | INTRAVENOUS | Status: AC
  Administered 2020-09-14: 06:00:00 200 meq via INTRAVENOUS

## 2020-09-14 MED ORDER — PANTOPRAZOLE SODIUM 40 MG IV SOLR
40.0000 mg | INTRAVENOUS | Status: DC
  Administered 2020-09-14: 11:00:00 40 mg via INTRAVENOUS
  Filled 2020-09-14: qty 40

## 2020-09-14 MED ORDER — ZINC SULFATE 220 (50 ZN) MG PO CAPS
220.0000 mg | ORAL_CAPSULE | Freq: Every day | ORAL | Status: DC
  Administered 2020-09-14: 09:00:00 220 mg
  Filled 2020-09-14: qty 1

## 2020-09-16 ENCOUNTER — Ambulatory Visit (INDEPENDENT_AMBULATORY_CARE_PROVIDER_SITE_OTHER): Payer: 59 | Admitting: Internal Medicine

## 2020-09-16 MED FILL — Medication: Qty: 1 | Status: AC

## 2020-10-03 NOTE — Progress Notes (Signed)
ANTICOAGULATION CONSULT NOTE - Follow Up Consult  Pharmacy Consult for Heparin Indication: pulmonary embolus s/p tPA on 09/13/20  Allergies  Allergen Reactions  . Penicillins     Patient Measurements: Height: 6\' 1"  (185.4 cm) Weight: (!) 169.2 kg (373 lb 0.3 oz) IBW/kg (Calculated) : 79.9 Heparin Dosing Weight: 119.5 kg Vital Signs: Temp: 96.2 F (35.7 C) (12/13 0730) Temp Source: Axillary (12/13 0730) BP: 74/41 (12/13 1115) Pulse Rate: 92 (12/13 1115)  Labs: Recent Labs    09/12/20 0317 09/13/20 0500 09/13/20 1648 09/13/20 2044 07-Oct-2020 0008 Oct 07, 2020 0213 07-Oct-2020 0302 10/07/20 0529 2020/10/07 0925  HGB 13.9 13.4  --    < > 9.4* 8.8* 8.4* 7.8*  --   HCT 40.9 39.9  --    < > 29.4* 26.0* 27.3* 23.0*  --   PLT 321 386  --   --  406*  --  366  --   --   APTT  --   --   --   --  67*  --   --   --   --   LABPROT  --   --   --   --  24.3*  --   --   --   --   INR  --   --   --   --  2.3*  --   --   --   --   HEPARINUNFRC  --   --   --   --   --   --   --   --  0.87*  CREATININE 1.65* 1.54*  --   --   --   --  3.79*  --   --   TROPONINIHS  --   --  40*  --  391*  --   --   --   --    < > = values in this interval not displayed.    Estimated Creatinine Clearance: 33 mL/min (A) (by C-G formula based on SCr of 3.79 mg/dL (H)).   Assessment: 65 years of age male on IV heparin after receiving systemic tPA for pulmonary embolism.   Heparin level is elevated at 0.87 (aPTT also prolonged at >200 ) on current rate of 1500 units/hr. Hemoglobin noted to be decreased at 7.8 from prior 9.5. Platelets are within normal limits. D-dimer >20. No overt bleeding noted per RN. Abdomen is soft and with active bowel sounds. Patient with acute kidney injury likely affecting heparin clearance.   Goal of Therapy:  Heparin level 0.3 to 0.5 units/ml until 07-Oct-2020 at 2203 PM then goal extends of 0.3 to 0.7 units/mL Monitor platelets by anticoagulation protocol: Yes   Plan:  Discussed levels  with Dr. Halford Chessman, CCM MD - Plan to hold IV Heparin until aPTT <80.  Hold IV Heparin now.  Repeat aPTT at ~12:30.  Daily Heparin levels and CBC while on therapy.   Sloan Leiter, PharmD, BCPS, BCCCP Clinical Pharmacist Please refer to [facility] for [place] numbers 2020-10-07,11:26 AM

## 2020-10-03 NOTE — Progress Notes (Signed)
CRITICAL VALUE ALERT  Critical Value:  APTT >200  Date & Time Notied:  10/13/20 at 1155  Provider Notified: Dr. Chesley Mires  Orders Received/Actions taken: Hold heparin for 1 hour and recheck APTT.

## 2020-10-03 NOTE — Progress Notes (Signed)
Patient continued to decline throughout the day despite max vasopressor dosages. After repositioning patient, patient's HR dropped down into the 30s, and a pulse was unable to be palpated. CPR started immediately. See code sheet for more details on medication administration. Dr. Halford Chessman reached out and updated family.

## 2020-10-03 NOTE — Progress Notes (Signed)
Pharmacy Antibiotic Note  Robert [name] is a 65 y.o. male admitted on 08/13/2020 with sepsis.  Pharmacy has been consulted for Vancomycin and Meropenem dosing.  Plan: Meropenem 2gm IV q8h Vancomycin 2500mg  IV now then 1250 mg IV Q 12 hrs.  Will f/u renal function, micro data, and pt's clinical condition Vanc levels prn   Height: 6\' 1"  (185.4 cm) Weight: (!) 165.2 kg (364 lb 3.2 oz) IBW/kg (Calculated) : 79.9  Temp (24hrs), Avg:97.9 F (36.6 C), Min:97.6 F (36.4 C), Max:98.1 F (36.7 C)  Recent Labs  Lab 09/07/20 1111 09/08/20 0122 09/09/20 0223 09/12/20 0317 09/13/20 0500 10-05-20 0008  WBC 18.3* 17.5* 15.3* 14.4* 15.5* 46.8*  CREATININE 1.60* 1.74* 1.65* 1.65* 1.54*  --     Estimated Creatinine Clearance: 80.2 mL/min (A) (by C-G formula based on SCr of 1.54 mg/dL (H)).    Allergies  Allergen Reactions  . Penicillins     Antimicrobials this admission: 12/13 Vanc >>  12/13 Meropenem >>   Microbiology results: 11/28 BCx: negative 11/28 MRSA PCR: negative  Thank you for allowing pharmacy to be a part of this patient's care.  Sherlon Handing, PharmD, BCPS Please see amion for complete clinical pharmacist phone list 10-05-2020 3:05 AM

## 2020-10-03 NOTE — Progress Notes (Addendum)
[facility] Progress Note Patient Name: Robert [name] DOB: [birth year] MRN: [id number]   Date of Service  09/19/2020  HPI/Events of Note  ABG on 100%/PRVC 35/TV 490/P 12 = 7.049/53.5/87.  eICU Interventions  Plan: 1. NaHCO3 200 meq IV now.  2. Increase NaHCO3 IV infusion to 150 mL/hour.  3. Increase ceiling on Norepinephrine IV infusion to 80 mcg/min.  4. Repeat ABG at 5 AM.     Intervention Category Major Interventions: Respiratory failure - evaluation and management;Acid-Base disturbance - evaluation and management  Zakaiya Lares Eugene 2020/09/19, 2:35 AM

## 2020-10-03 NOTE — Progress Notes (Signed)
NAME:  Robert [name], MRN:  [id number], DOB:  03-25-1956, LOS: 34 ADMISSION DATE:  08/22/2020, CONSULTATION DATE: 11/28 REFERRING MD:  Dr. Darrick Meigs, CHIEF COMPLAINT:  SOB  Brief History   65 yo male presented to [facility] with progressive dyspnea and hypoxia with SpO2 67% on room air.  Found to have COVID 19 pneumonia after attending a funeral.  Had COVID 19 vaccination, but hadn't received booster yet.  Completed course of remdesivir, and baricitinib.  Was on full dose lovenox.  On 12/12 developed acute worsening of hypoxia associated with shock required multiple pressors and lactic acidosis.  Concern for acute PE and empirically treated with thrombolytic therapy.  Past Medical History  HTN GERD  DM II  BMI 54.64 BPH  Significant Hospital Events   11/28 Admit from [facility] with COVID  12/01 Doing well on HFNC and NRB. Weaned to FIO2 90% and 35L/min.  12/02 O2 needs improved to 30L flow / 70% 12/12 O2 needs suddenly worse, received thrombolytic therapy at 11 pm  Consults:    Procedures:  ETT 12/12 >> Rt femoral CVL 12/12 >> Lt femoral a line 12/13 >>   Significant Diagnostic Tests:  11/29 Echo >> EF 76-28%, grade 1 diastolic dysfunction. 12/04 doppler legs b/l >> no DVT  Micro Data:  COVID 11/28 >> positive  BCx2 11/28 >> negative MRSA PCR 11/28 >> negative   Antimicrobials:  Azithromycin 11/28  Ceftriaxone 11/28 Baricitinib 11/28 >> 12/10 Remdesivir 11/28 >> 12/02  Interim history/subjective:  Events of last night noted.  Remains on maximum dose of multiple pressors.   Objective   Blood pressure (!) 73/51, pulse 96, temperature (!) 96.2 F (35.7 C), temperature source Axillary, resp. rate (!) 38, height 6\' 1"  (1.854 m), weight (!) 169.2 kg, SpO2 99 %.    Vent Mode: PRVC FiO2 (%):  [100 %] 100 % Set Rate:  [10 bmp-35 bmp] 35 bmp Vt Set:  [490 mL] 490 mL PEEP:  [8 cmH20-12 cmH20] 12 cmH20 Plateau Pressure:  [24 cmH20-35 cmH20] 32 cmH20   Intake/Output Summary (Last 24  hours) at September 16, 2020 1020 Last data filed at 09-16-2020 0900 Gross per 24 hour  Intake 3885.62 ml  Output 0 ml  Net 3885.62 ml   Filed Weights   09/13/20 0406 Sep 16, 2020 0000 16-Sep-2020 0500  Weight: (!) 165.2 kg (!) 165.2 kg (!) 169.2 kg   Physical Exam:  General - unresponsive Eyes - pupils reactive ENT - ETT in place Cardiac - irregular Chest - b/l rhonchi Abdomen - absent bowel sounds Extremities - cool, mottled Skin - no rashes Neuro - opens eyes with stimulation, not following commands  Resolved Hospital Problem list     Assessment & Plan:   Shock state with concern for acute pulmonary embolism in setting of COVID 19 infection. - empirically treated with thrombolytic therapy on 09/13/20 at 11 pm - continue heparin gtt - pressors to keep MAP > 65 if able  Possible septic shock. - continue ABx  Acute hypoxic, hypercapnic respiratory failure.   - full vent support - f/u CXR, ABG  COVID 19 pneumonia with ARDS. - completed remdesivir, barcitinib - continue solumedrol  AKI 2nd to hypoxia, shock state. Anion gap metabolic acidosis with lactic acidosis. Hyperkalemia. CKD 3b. - baseline creatinine 1.50 from 04/28/20 - not able to place HD catheter 12/13 since he recently received thrombolytic therapy - f/u ABG, BMET - continue HCO3 in IV fluid - if he survives through to 12/14 will reassess if he would be able  to tolerate CRRT and then consult nephrology  Goals of care. - had lengthy discussion with pt's wife at bedside - explained he is critically ill, on maximal support and not currently showing signs of improvement - she is not ready to commit to DNR status and is still hopeful he can improve, but understands he might not survive this acute illness  Diabetes type 2 poorly controlled with steroid induced hyperglycemia. - insulin gtt  Hx of hypertension, HLD. - hold outpt metoprolol, crestor  Hx of BPH. - hold outpt tamsulosin   Best practice (evaluated  daily)  Diet: NPO DVT prophylaxis: IV heparin GI prophylaxis: protonix Mobility: bed rest Code Status: Full code Family Communication: updated pt's wife at bedside Disposition: ICU  Labs    CMP Latest Ref Rng & Units Oct 09, 2020 09-Oct-2020 October 09, 2020  Glucose 70 - 99 mg/dL - 245(H) -  BUN 8 - 23 mg/dL - 36(H) -  Creatinine 0.61 - 1.24 mg/dL - 3.79(H) -  Sodium 135 - 145 mmol/L 145 147(H) 140  Potassium 3.5 - 5.1 mmol/L 4.6 5.2(H) 5.2(H)  Chloride 98 - 111 mmol/L - 98 -  CO2 22 - 32 mmol/L - 16(L) -  Calcium 8.9 - 10.3 mg/dL - 7.6(L) -  Total Protein 6.5 - 8.1 g/dL - 4.2(L) -  Total Bilirubin 0.3 - 1.2 mg/dL - 0.9 -  Alkaline Phos 38 - 126 U/L - 53 -  AST 15 - 41 U/L - 933(H) -  ALT 0 - 44 U/L - 1,277(H) -    CBC Latest Ref Rng & Units 10-09-2020 2020/10/09 October 09, 2020  WBC 4.0 - 10.5 K/uL - 50.6(HH) -  Hemoglobin 13.0 - 17.0 g/dL 7.8(L) 8.4(L) 8.8(L)  Hematocrit 39.0 - 52.0 % 23.0(L) 27.3(L) 26.0(L)  Platelets 150 - 400 K/uL - 366 -    ABG    Component Value Date/Time   PHART 7.132 (LL) 10/09/2020 0529   PCO2ART 56.8 (H) Oct 09, 2020 0529   PO2ART 78 (L) 09-Oct-2020 0529   HCO3 19.1 (L) 2020-10-09 0529   TCO2 21 (L) 10-09-2020 0529   ACIDBASEDEF 10.0 (H) October 09, 2020 0529   O2SAT 91.0 October 09, 2020 0529    CBG (last 3)  Recent Labs    2020/10/09 0612 09-Oct-2020 0720 2020/10/09 0814  GLUCAP 165* 176* 157*    Critical care time: 43 minutes  Chesley Mires, MD [place] Pager - [phone] 2020/10/09, 10:37 AM

## 2020-10-03 NOTE — Progress Notes (Signed)
eLink Physician-Brief Progress Note Patient Name: Robert [name] DOB: 11/09/1955 MRN: [id number]   Date of Service  30-Sep-2020  HPI/Events of Note  Lactic Acid level > 11.0 and hypotension. BP = 65/43 with MAP = 50. Wife now at bedside.   eICU Interventions  Plan: 1. NaHCO3 200 meq IV now.  2. Will ask ground team to come and speak with wife about [place].     Intervention Category Major Interventions: Acid-Base disturbance - evaluation and management  Kada Friesen [place] 30-Sep-2020, 5:22 AM

## 2020-10-03 NOTE — Procedures (Signed)
Arterial Catheter Insertion Procedure Note  Robert [name]  [id number]  22-May-1956  Date:19-Sep-2020  Time:4:57 AM    Provider Performing: Collier Bullock    Procedure: Insertion of Arterial Line [id number]) with US guidance (43735)   Indication(s) Blood pressure monitoring and/or need for frequent ABGs Radial art line stopped functioning, high dose pressors, profound hypotension Benefit outweigh risks despite patient having received TPA in the early evening.   Consent Unable to obtain consent due to emergent nature of procedure.  Anesthesia 1% lidocaine   Time Out Verified patient identification, verified procedure, site/side was marked, verified correct patient position, special equipment/implants available, medications/allergies/relevant history reviewed, required imaging and test results available.   Sterile Technique Maximal sterile technique including full sterile barrier drape, hand hygiene, sterile gown, sterile gloves, mask, hair covering, sterile ultrasound probe cover (if used).   Procedure Description Area of catheter insertion was cleaned with chlorhexidine and draped in sterile fashion. With real-time ultrasound guidance an arterial catheter was placed into the left femoral artery.  Appropriate arterial tracings confirmed on monitor.     Complications/Tolerance None; patient tolerated the procedure well.   EBL Minimal   Specimen(s) None

## 2020-10-03 NOTE — Progress Notes (Signed)
ABG collected and critical values called in to [facility].

## 2020-10-03 NOTE — Progress Notes (Signed)
ANTICOAGULATION CONSULT NOTE - Initial Consult  Pharmacy Consult for heparin Indication: pulmonary embolus, s/p tPA  Allergies  Allergen Reactions  . Penicillins     Patient Measurements: Height: 6\' 1"  (185.4 cm) Weight: (!) 165.2 kg (364 lb 3.2 oz) IBW/kg (Calculated) : 79.9 Heparin Dosing Weight: 119.5 kg  Vital Signs: Temp: 97.8 F (36.6 C) (12/12 1939) Temp Source: Oral (12/12 1939) BP: 84/67 (12/13 0000) Pulse Rate: 120 (12/13 0000)  Labs: Recent Labs    09/12/20 0317 09/13/20 0500 09/13/20 1648 09/13/20 2044 09/13/20 2303 10/06/2020 0008  HGB 13.9 13.4  --  10.2* 9.5* 9.4*  HCT 40.9 39.9  --  30.0* 28.0* 29.4*  PLT 321 386  --   --   --  406*  APTT  --   --   --   --   --  67*  LABPROT  --   --   --   --   --  24.3*  INR  --   --   --   --   --  2.3*  CREATININE 1.65* 1.54*  --   --   --   --   TROPONINIHS  --   --  40*  --   --   --     Estimated Creatinine Clearance: 80.2 mL/min (A) (by C-G formula based on SCr of 1.54 mg/dL (H)).   Medical History: Past Medical History:  Diagnosis Date  . Diabetes mellitus without complication ([place])   . GERD (gastroesophageal reflux disease)   . Headache   . Hypertension     Medications:  Medications Prior to Admission  Medication Sig Dispense Refill Last Dose  . amLODipine (NORVASC) 10 MG tablet Take 1 tablet (10 mg total) by mouth daily. 90 tablet 1 Past Week at Unknown time  . glipiZIDE (GLUCOTROL XL) 10 MG 24 hr tablet TAKE ONE TABLET (10MG  TOTAL) BY MOUTH DAILY WITH LUNCH (Patient taking differently: Take 10 mg by mouth daily with breakfast. ) 30 tablet 2 Past Week at Unknown time  . hydrochlorothiazide (HYDRODIURIL) 25 MG tablet Take 1 tablet (25 mg total) by mouth daily. 30 tablet 2 Past Week at Unknown time  . indomethacin (INDOCIN SR) 75 MG CR capsule Take 75 mg by mouth 3 (three) times daily.   Past Week at Unknown time  . linagliptin (TRADJENTA) 5 MG TABS tablet Take 1 tablet (5 mg total) by mouth daily.  90 tablet 0 Past Week at Unknown time  . losartan (COZAAR) 100 MG tablet Take 1 tablet (100 mg total) by mouth daily. 90 tablet 1 Past Week at Unknown time  . omeprazole (PRILOSEC) 40 MG capsule Take 1 capsule (40 mg total) by mouth daily. 90 capsule 1 Past Week at Unknown time  . rosuvastatin (CRESTOR) 10 MG tablet TAKE ONE (1) TABLET BY MOUTH EVERY DAY (Patient taking differently: Take 10 mg by mouth daily. ) 90 tablet 0 Past Week at Unknown time  . Semaglutide (RYBELSUS) 3 MG TABS Take 3 mg by mouth daily. 30 tablet 3 Past Week at Unknown time  . tamsulosin (FLOMAX) 0.4 MG CAPS capsule Take 1 capsule (0.4 mg total) by mouth daily. 90 capsule 1 Past Week at Unknown time  . rosuvastatin (CRESTOR) 40 MG tablet Take 1 tablet (40 mg total) by mouth at bedtime. (Patient not taking: Reported on 08/12/2020) 30 tablet 3 Not Taking at Unknown time    Assessment: 65 yo man  s/p tPA for r/o PE to start heparin drip.  He was on lovenox previously.  APTT now 67 sec after tPA.  Hg  9.4, PTLC 406 Goal of Therapy:  Heparin level 0.3-0.5 for 24 hours then 0.3-0.7 units/ml Monitor platelets by anticoagulation protocol: Yes   Plan:  Start heparin drip now no bolus at 1500 units/hr Check heparin level 6-8 hours after start Monitor for bleeding complications  [facility] [place] 10-13-20,12:48 AM

## 2020-10-03 NOTE — Progress Notes (Signed)
  Echocardiogram 2D Echocardiogram has been attempted. Cancel echo per Dr Halford Chessman due to poor patient condition.Randa Lynn Dorthia Tout 2020/09/18, 9:04 AM

## 2020-10-03 NOTE — Progress Notes (Signed)
Abg collected and critical results called in to [facility].

## 2020-10-03 NOTE — Plan of Care (Signed)
  Problem: Clinical Measurements: Goal: Diagnostic test results will improve Outcome: Not Progressing Goal: Respiratory complications will improve Outcome: Not Progressing Goal: Cardiovascular complication will be avoided Outcome: Not Progressing   Problem: Activity: Goal: Risk for activity intolerance will decrease Outcome: Not Progressing

## 2020-10-03 NOTE — Progress Notes (Signed)
Pharmacy Antibiotic Note  Robert [name] is a 65 y.o. male admitted on 08/29/2020 with sepsis.  Pharmacy has been consulted for Vancomycin and Meropenem dosing.  Patient with shock and multiorgan failure - SCr now up to 3.79 with normalized CrCl ~20 mL/min.   Plan: Reduce Merrem to 1g IV every 12 hours.  Hold Vancomycin. Will dose based on levels - plan for next level at q48h if continue therapy.  Will f/u renal function, micro data, and pt's clinical condition   Height: 6\' 1"  (185.4 cm) Weight: (!) 169.2 kg (373 lb 0.3 oz) IBW/kg (Calculated) : 79.9  Temp (24hrs), Avg:97.5 F (36.4 C), Min:96.2 F (35.7 C), Max:98.1 F (36.7 C)  Recent Labs  Lab 09/08/20 0122 09/09/20 0223 09/12/20 0317 09/13/20 0500 Sep 24, 2020 0008 Sep 24, 2020 0302 09-24-20 0421  WBC 17.5* 15.3* 14.4* 15.5* 46.8* 50.6*  --   CREATININE 1.74* 1.65* 1.65* 1.54*  --  3.79*  --   LATICACIDVEN  --   --   --   --   --   --  >11.0*    Estimated Creatinine Clearance: 33 mL/min (A) (by C-G formula based on SCr of 3.79 mg/dL (H)).    Allergies  Allergen Reactions  . Penicillins     Antimicrobials this admission: 12/13 Vanc >>  12/13 Meropenem >>   Microbiology results: 11/28 BCx: negative 11/28 MRSA PCR: negative  Thank you for allowing pharmacy to be a part of this patient's care.  Sloan Leiter, PharmD, BCPS, BCCCP Clinical Pharmacist Please refer to [facility] for [place] numbers 2020/09/24 9:59 AM

## 2020-10-03 NOTE — Death Summary Note (Signed)
Robert [name] was a 65 y.o. male presented to [facility] with progressive dyspnea and hypoxia with SpO2 67% on room air.  Found to have COVID 19 pneumonia after attending a funeral.  Had COVID 19 vaccination, but hadn't received booster yet.  Completed course of remdesivir, and baricitinib.  Was on full dose lovenox.  On 12/12 developed acute worsening of hypoxia associated with shock required multiple pressors and lactic acidosis.  Concern for acute PE and empirically treated with thrombolytic therapy.  He was not able to improve clinically in spite of maximal therapy.  He developed worsening hypotension and hypoxia.  He then developed asystole.  CPR started but he was not able to be resuscitated.  He passed away on 09/27/2020 at 13:41.  Final diagnoses: Cardiac obstructive shock from acute pulmonary embolism COVID 19 Pneumonia with ARDS Septic shock Acute hypoxic and hypercapnic respiratory failure Acute renal failure from hypoxia and shock Acute anion gap metabolic acidosis with lactic acidosis Hyperkalemia CKD 3b Anemia of critical illness Elevated LFTs from shock state and hypoperfusion DM type 2 poorly controlled with steroid induced hyperglycemia Morbid obesity with BMI 54.64 Hx of Hypertension, Hyperlipidemia, and BPH  Chesley Mires, MD [place] September 27, 2020, 2:07 PM

## 2020-10-03 NOTE — Progress Notes (Signed)
CRITICAL VALUE ALERT  Critical Value:  Troponin 391  Date & Time Notied:  2020-10-02  0123  Provider Notified: Warren Lacy  Orders Received/Actions taken:

## 2020-10-03 NOTE — Progress Notes (Signed)
eLink Physician-Brief Progress Note Patient Name: Robert [name] DOB: 1955-10-31 MRN: [id number]   Date of Service  21-Sep-2020  HPI/Events of Note  Hyperglycemia - Patient now on an insulin IV infusion.   eICU Interventions  Plan: 1. D/C Levemir insulin Q 12 hours.      Intervention Category Major Interventions: Hyperglycemia - active titration of insulin therapy  Lysle Dingwall 2020/09/21, 5:48 AM

## 2020-10-03 NOTE — Progress Notes (Signed)
eLink Physician-Brief Progress Note Patient Name: JAMALL STROHMEIER DOB: Feb 19, 1956 MRN: [id number]   Date of Service  25-Sep-2020  HPI/Events of Note  In spite of aggressive support with mechanical ventilation, 2 vasopressors at extremely high doses and NaHCO3 IV infusion and boluses, Mr. Bann continues to deteriorate.    eICU Interventions  Called placed to wife, Branon Sabine,  and informed her of the patient's grave medical condition and failure to improve with aggrressive medical management. I told her that I did not think that he would survive the evening and suggested that she come to the hospital to visit him and discuss possible DNR and/or comfort care.     Intervention Category Major Interventions: End of life / care limitation discussion  Lysle Dingwall 25-Sep-2020, 3:01 AM

## 2020-10-03 NOTE — Progress Notes (Addendum)
Persistent severe hypotension overnight.  Severe acidosis, on bicarb infusion, bicarb pushes.   Metabolic acidosis.  Lactate very elevated.  WBC elevated.   Bedside echo - poor windows, not able to see subxiphoid view, not able to assess ivc.  Has received 3 L fluid bolus this shift.   No effusion, RV not enlarged, LV appears generally hyperdynamic.  No cardiac cause for hypotension.   Possible sepsis, bowel ischemia? Unclear initial source of acidosis.   Started meropenem, vancomycin, solumedrol.   Was given TPA last night for possible PE.  No evidence of bleeding based on H/H, stable.   Discussed decline with his wife.   I recommended DNR, but she wants to continue full code.   Patient remains FULL CODE.

## 2020-10-03 NOTE — Progress Notes (Signed)
PT Cancellation Note  Patient Details Name: Robert [name] MRN: [id number] DOB: April 26, 1956   Cancelled Treatment:    Reason Eval/Treat Not Completed: Medical issues which prohibited therapy Chart reviewed this morning and pt with significant decline in status since last PT treatment.  Now in ICU , intubated at 100% FiO2.  Later noted PT order has been cancelled.  Abran Richard, PT Acute Rehab Services Pager [phone] [facility] [place] 2020-10-08, 10:46 AM

## 2020-10-03 NOTE — Progress Notes (Addendum)
   09/13/20 1433  Assess: MEWS Score  Temp 97.9 F (36.6 C)  BP 95/75  Pulse Rate (!) 120  ECG Heart Rate (!) 120  Resp (!) 24  SpO2 96 %  Assess: MEWS Score  MEWS Temp 0  MEWS Systolic 1  MEWS Pulse 2  MEWS RR 1  MEWS LOC 0  MEWS Score 4  MEWS Score Color Red  Assess: if the MEWS score is Yellow or Red  Were vital signs taken at a resting state? Yes  Focused Assessment Change from prior assessment (see assessment flowsheet)  Early Detection of Sepsis Score *See Row Information* High  MEWS guidelines implemented *See Row Information* Yes  Treat  MEWS Interventions Consulted Respiratory Therapy;Escalated (See documentation below)  Pain Scale 0-10  Pain Score 0  Take Vital Signs  Increase Vital Sign Frequency  Red: Q 1hr X 4 then Q 4hr X 4, if remains red, continue Q 4hrs  Escalate  MEWS: Escalate Red: discuss with charge nurse/RN and provider, consider discussing with RRT  Notify: Charge Nurse/RN  Name of Charge Nurse/RN Notified Glenford Bayley RN  Date Charge Nurse/RN Notified 09/13/20  Time Charge Nurse/RN Notified 1437  Notify: Provider  Provider Name/Title Dr. Candiss Norse  Date Provider Notified 09/13/20  Time Provider Notified 1531  Notification Type Page  Notification Reason Change in status  Response See new orders  Date of Provider Response 09/13/20  Time of Provider Response 5625  Notify: Rapid Response  Name of Rapid Response RN Notified Saralyn Pilar RN  Date Rapid Response Notified 09/13/20  Time Rapid Response Notified [id number]  Document  Progress note created (see row info) Yes    Patient called out to nurses station stating he could not breathe. Pt found unresponsive by NT. Patient's pulse ox not reading on 15L NRB + 7L HF. Patient's HFNC increased to 15L + 15L NRB. Dr. Candiss Norse and Saralyn Pilar, [place] RN paged to come to bedside. A total of 10mg  cardizem and 100mg  lasix administered per Dr. Keturah Barre verbal orders.

## 2020-10-03 DEATH — deceased

## 2020-10-15 ENCOUNTER — Ambulatory Visit (INDEPENDENT_AMBULATORY_CARE_PROVIDER_SITE_OTHER): Payer: 59 | Admitting: Internal Medicine
